# Patient Record
Sex: Female | Born: 1935
Health system: Southern US, Community
[De-identification: ages and names within clinical notes are randomized; demographics above are authoritative.]

## PROBLEM LIST (undated history)

## (undated) DIAGNOSIS — U071 COVID-19: Secondary | ICD-10-CM

## (undated) DIAGNOSIS — B9681 Helicobacter pylori [H. pylori] as the cause of diseases classified elsewhere: Secondary | ICD-10-CM

## (undated) DIAGNOSIS — C541 Malignant neoplasm of endometrium: Secondary | ICD-10-CM

## (undated) DIAGNOSIS — R7303 Prediabetes: Secondary | ICD-10-CM

## (undated) DIAGNOSIS — T4145XA Adverse effect of unspecified anesthetic, initial encounter: Secondary | ICD-10-CM

## (undated) DIAGNOSIS — I10 Essential (primary) hypertension: Secondary | ICD-10-CM

## (undated) DIAGNOSIS — K219 Gastro-esophageal reflux disease without esophagitis: Secondary | ICD-10-CM

## (undated) DIAGNOSIS — M545 Low back pain, unspecified: Secondary | ICD-10-CM

## (undated) DIAGNOSIS — R011 Cardiac murmur, unspecified: Secondary | ICD-10-CM

## (undated) DIAGNOSIS — M8580 Other specified disorders of bone density and structure, unspecified site: Secondary | ICD-10-CM

## (undated) DIAGNOSIS — E871 Hypo-osmolality and hyponatremia: Secondary | ICD-10-CM

## (undated) DIAGNOSIS — G2581 Restless legs syndrome: Secondary | ICD-10-CM

## (undated) DIAGNOSIS — K259 Gastric ulcer, unspecified as acute or chronic, without hemorrhage or perforation: Secondary | ICD-10-CM

## (undated) DIAGNOSIS — M1990 Unspecified osteoarthritis, unspecified site: Secondary | ICD-10-CM

## (undated) DIAGNOSIS — T8859XA Other complications of anesthesia, initial encounter: Secondary | ICD-10-CM

## (undated) DIAGNOSIS — G8929 Other chronic pain: Secondary | ICD-10-CM

## (undated) HISTORY — DX: Prediabetes: R73.03

## (undated) HISTORY — DX: Restless legs syndrome: G25.81

## (undated) HISTORY — DX: Low back pain, unspecified: M54.50

## (undated) HISTORY — DX: Other chronic pain: G89.29

## (undated) HISTORY — DX: COVID-19: U07.1

## (undated) HISTORY — DX: Hypo-osmolality and hyponatremia: E87.1

## (undated) HISTORY — DX: Other specified disorders of bone density and structure, unspecified site: M85.80

## (undated) HISTORY — PX: KNEE ARTHROSCOPY W/ MENISCAL REPAIR: SHX1877

---

## 1987-01-31 HISTORY — PX: BACK SURGERY: SHX140

## 1990-01-30 HISTORY — PX: DILATION AND CURETTAGE OF UTERUS: SHX78

## 1990-01-30 HISTORY — PX: ABDOMINAL HYSTERECTOMY: SHX81

## 2004-05-19 ENCOUNTER — Ambulatory Visit: Payer: Self-pay | Admitting: Internal Medicine

## 2005-05-22 ENCOUNTER — Ambulatory Visit: Payer: Self-pay | Admitting: Internal Medicine

## 2005-09-08 ENCOUNTER — Ambulatory Visit: Payer: Self-pay | Admitting: Internal Medicine

## 2006-05-28 ENCOUNTER — Ambulatory Visit: Payer: Self-pay | Admitting: Internal Medicine

## 2006-09-07 ENCOUNTER — Ambulatory Visit: Payer: Self-pay | Admitting: Unknown Physician Specialty

## 2006-12-21 ENCOUNTER — Ambulatory Visit: Payer: Self-pay | Admitting: Unknown Physician Specialty

## 2007-07-30 ENCOUNTER — Ambulatory Visit: Payer: Self-pay | Admitting: Internal Medicine

## 2008-01-03 ENCOUNTER — Ambulatory Visit: Payer: Self-pay | Admitting: Internal Medicine

## 2008-09-28 ENCOUNTER — Ambulatory Visit: Payer: Self-pay | Admitting: Internal Medicine

## 2009-06-08 ENCOUNTER — Encounter: Payer: Self-pay | Admitting: Gastroenterology

## 2009-06-22 ENCOUNTER — Encounter: Payer: Self-pay | Admitting: Gastroenterology

## 2009-06-29 ENCOUNTER — Encounter: Payer: Self-pay | Admitting: Gastroenterology

## 2009-06-29 ENCOUNTER — Ambulatory Visit: Payer: Self-pay | Admitting: Unknown Physician Specialty

## 2009-07-06 ENCOUNTER — Encounter (INDEPENDENT_AMBULATORY_CARE_PROVIDER_SITE_OTHER): Payer: Self-pay | Admitting: *Deleted

## 2009-07-06 ENCOUNTER — Telehealth (INDEPENDENT_AMBULATORY_CARE_PROVIDER_SITE_OTHER): Payer: Self-pay | Admitting: *Deleted

## 2009-07-06 DIAGNOSIS — R933 Abnormal findings on diagnostic imaging of other parts of digestive tract: Secondary | ICD-10-CM | POA: Insufficient documentation

## 2009-07-06 HISTORY — DX: Abnormal findings on diagnostic imaging of other parts of digestive tract: R93.3

## 2009-07-29 ENCOUNTER — Ambulatory Visit (HOSPITAL_COMMUNITY): Admission: RE | Admit: 2009-07-29 | Discharge: 2009-07-29 | Payer: Self-pay | Admitting: Gastroenterology

## 2009-07-29 ENCOUNTER — Ambulatory Visit: Payer: Self-pay | Admitting: Gastroenterology

## 2009-09-30 ENCOUNTER — Ambulatory Visit: Payer: Self-pay | Admitting: Internal Medicine

## 2010-03-01 NOTE — Letter (Signed)
Summary: EGD Instructions  [facility]  [address] [place], [place] [address]   Phone: [phone]  Fax: [phone]       Jill Stanley    Jun 20, 1932    MRN: [id number]       Procedure Day /Date:07/29/09 [name]       Arrival Time:1030 am     Procedure Time:1130 am     Location of Procedure:                     [facility] ( Outpatient Registration)    PREPARATION FOR ENDOSCOPY   On 07/29/09  THE DAY OF THE PROCEDURE:  1.   No solid foods, milk or milk products are allowed after midnight the night before your procedure.  2.   Do not drink anything colored red or purple.  Avoid juices with pulp.  No orange juice.  3.  You may drink clear liquids until 730 am , which is 4 hours before your procedure.                                                                                                CLEAR LIQUIDS INCLUDE: Water Jello Ice Popsicles Tea (sugar ok, no milk/cream) Powdered fruit flavored drinks Coffee (sugar ok, no milk/cream) Gatorade Juice: apple, white grape, white cranberry  Lemonade Clear bullion, consomm, broth Carbonated beverages (any kind) Strained chicken noodle soup Hard Candy   MEDICATION INSTRUCTIONS  Unless otherwise instructed, you should take regular prescription medications with a small sip of water as early as possible the morning of your procedure.             OTHER INSTRUCTIONS  You will need a responsible adult at least 75 years of age to accompany you and drive you home.   This person must remain in the waiting room during your procedure.  Wear loose fitting clothing that is easily removed.  Leave jewelry and other valuables at home.  However, you may wish to bring a book to read or an iPod/MP3 player to listen to music as you wait for your procedure to start.  Remove all body piercing jewelry and leave at home.  Total time from sign-in until discharge is approximately 2-3 hours.  You should go  home directly after your procedure and rest.  You can resume normal activities the day after your procedure.  The day of your procedure you should not:   Drive   Make legal decisions   Operate machinery   Drink alcohol   Return to work  You will receive specific instructions about eating, activities and medications before you leave.    The above instructions have been reviewed and explained to me by   Chales Abrahams CMA Duncan Dull)  July 06, 2009 3:09 PM     I fully understand and can verbalize these instructions over the phone mailed to home Date 07/06/09

## 2010-03-01 NOTE — Procedures (Signed)
Summary: [facility]  [facility]   Imported By: Lester Roslyn 07/12/2009 09:51:09  _____________________________________________________________________  External Attachment:    Type:   Image     Comment:   External Document

## 2010-03-01 NOTE — Progress Notes (Signed)
Summary: EUS  Phone Note Outgoing Call Call back at [facility] Phone [phone]   Call placed by: Chales Abrahams CMA Duncan Dull),  July 06, 2009 3:07 PM Summary of Call: pt scheduled for EUS, need to review meds and instruct pt Initial call taken by: Chales Abrahams CMA Duncan Dull),  July 06, 2009 3:07 PM  Follow-up for Phone Call        pt aware of instructions and meds reviewed.  she will call with any questions Follow-up by: Chales Abrahams CMA Duncan Dull),  July 06, 2009 3:47 PM  New Problems: NONSPECIFIC ABN FINDING RAD & OTH EXAM GI TRACT (ICD-793.4)   New Problems: NONSPECIFIC ABN FINDING RAD & OTH EXAM GI TRACT (ICD-793.4)

## 2010-03-01 NOTE — Procedures (Signed)
Summary: Endoscopic Ultrasound  Patient: Jill Stanley Note: All result statuses are Final unless otherwise noted.  Tests: (1) Endoscopic Ultrasound (EUS)  EUS Endoscopic Ultrasound                             DONE     [facility]     [address] [place], [place]  [address]           ENDOSCOPIC ULTRASOUND PROCEDURE REPORT     PATIENT:  Jill, Stanley  MR#:  [id number]     BIRTHDATE:  07/15/1932  GENDER:  female     ENDOSCOPIST:  Rachael Fee, MD     REFERRED BY:  Lynnae Prude, M.D.     PROCEDURE DATE:  07/29/2009     PROCEDURE:  Upper EUS     ASA CLASS:  Class II     INDICATIONS:  enlarged gastric folds noted by Dr. 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 The patient was then placed in the left, lateral,     decubitus postion and IV sedation was administered. Throughout the     procedure, the patient's blood pressure, pulse and oxygen     saturations were monitored continuously.  Under direct     visualization, the  endoscope was introduced through the mouth     and advanced to the duodenum.  Water was used as necessary to     provide an acoustic interface.  Upon completion of the imaging,     water was removed and the patient was sent to the recovery room in     satisfactory condition.     <<PROCEDUREIMAGES>>     Endoscopic findings:     1. Normal esophagus     2. 1cm, crescent shaped fold/prominant mucosa in distal stomach.     Following EUS examination the mucosa was biopsied.     3. Normal duodenum           EUS findings:     1. The lesion above corresponded with mildly thickend     mucosa/submucosal layers of gastric wall. There was no     sub-epithelial component, no gastric wall  masses.     2. No perigastric adenopathy.     3. Limited views of the pancreas, liver, portal vessels were all     normal.           Impression:     Non-specific, mild thickening of mucosa, submucosal layer of     distal gastric wall. This is not a gastric wall mass or     subepithelial lesion and will not require surveillance     examinations.  Repeat mucosal biopsy was performed.  She had a     gastric ulcer in the past, perhaps this is the site of previous     ulcer.           ______________________________     Rachael Fee, MD           n.     eSIGNED:   Rachael Fee  at 07/29/2009 12:38 PM           Jill, Stanley, [id number]  Note: An exclamation mark (!) indicates a result that was not dispersed into the flowsheet. Document Creation Date: 07/29/2009 12:38 PM _______________________________________________________________________  (1) Order result status: Final Collection or observation date-time: 07/29/2009 12:28 Requested date-time:  Receipt date-time:  Reported date-time:  Referring Physician:   Ordering Physician: Rob Bunting [phone]) Specimen Source:  Source: Launa Grill Order Number: [id number] Lab site:

## 2010-03-01 NOTE — Letter (Signed)
Summary: Scot Jun MD  Scot Jun MD   Imported By: Lester Kermit 07/12/2009 09:54:21  _____________________________________________________________________  External Attachment:    Type:   Image     Comment:   External Document

## 2010-10-26 ENCOUNTER — Ambulatory Visit: Payer: Self-pay | Admitting: Family Medicine

## 2011-10-27 ENCOUNTER — Ambulatory Visit: Payer: Self-pay | Admitting: Family Medicine

## 2011-11-30 ENCOUNTER — Ambulatory Visit: Payer: Self-pay | Admitting: Unknown Physician Specialty

## 2011-11-30 LAB — HM COLONOSCOPY

## 2012-10-23 ENCOUNTER — Ambulatory Visit: Payer: Self-pay | Admitting: General Practice

## 2012-10-31 ENCOUNTER — Ambulatory Visit: Payer: Self-pay | Admitting: Family Medicine

## 2012-11-07 ENCOUNTER — Ambulatory Visit: Payer: Self-pay | Admitting: General Practice

## 2012-11-07 LAB — POTASSIUM: Potassium: 4 mmol/L (ref 3.5–5.1)

## 2012-11-07 LAB — SODIUM: Sodium: 128 mmol/L — ABNORMAL LOW (ref 136–145)

## 2012-11-15 ENCOUNTER — Ambulatory Visit: Payer: Self-pay | Admitting: General Practice

## 2013-11-04 ENCOUNTER — Ambulatory Visit: Payer: Self-pay | Admitting: Family Medicine

## 2013-11-20 ENCOUNTER — Ambulatory Visit: Payer: Self-pay | Admitting: Orthopedic Surgery

## 2014-05-22 NOTE — Op Note (Signed)
PATIENT NAME:  Jill Stanley, PAREKH MR#:  [id number] DATE OF BIRTH:  06-15-1932  DATE OF PROCEDURE:  11/15/2012  PREOPERATIVE DIAGNOSIS:  Internal derangement of the left knee.   POSTOPERATIVE DIAGNOSES:  1.  Tear of the posterior horn medial meniscus, left knee.  2.  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   PROCEDURE PERFORMED:  Left knee arthroscopy, partial medial meniscectomy, and medial chondroplasty.   SURGEON:  Laurice Record. Hooten, MD  ANESTHESIA:  General.   ESTIMATED BLOOD LOSS:  Minimal.   TOURNIQUET TIME:  Not used.   DRAINS:  None.   INDICATIONS FOR SURGERY:  The patient is a 81 year old female who has been seen for complaints of persistent left knee pain. MRI demonstrated findings consistent with meniscal pathology as well as thinning of the articular cartilage. After a discussion of the risks and benefits of surgical intervention, the patient expressed understanding of the risks and benefits and agreed with plans for surgical intervention.   PROCEDURE IN DETAIL:  The patient was brought into the Operating Room, and after adequate general anesthesia was achieved, a tourniquet was placed on the patient's left thigh and the leg was placed in a leg holder. All bony prominences were well padded. The patient's left knee and leg were cleaned and prepped with alcohol and DuraPrep and draped in the usual sterile fashion. A "time-out" was performed as per usual protocol. The anticipated portal sites were injected with 0.25% Marcaine. An anterolateral portal was created and a cannula was inserted. A small effusion was evacuated. The scope was inserted and the knee was distended with fluid using the Stryker pump. The scope was advanced down the medial gutter into the medial compartment of the knee. Under visualization with the scope, an anteromedial portal was created and a hook probe was inserted. Inspection of the medial compartment demonstrated a radial-type  tear involving the posterior horn of the medial meniscus. The tear was debrided using meniscal punches and a 4.5-mm shaver. Contouring of the site was then performed using the 50-degree ArthroCare wand. The remaining portion of the medial meniscus was visualized and probed and felt to be stable. There were changes of grade III to early grade IV chondromalacia involving primarily the medial femoral condyle. These areas were debrided using the ArthroCare wand with a smooth transition zone created to the more normal-appearing articular cartilage. Lesser changes were noted to the medial tibial plateau and these areas were also contoured. The scope was then advanced into the intracondylar region. The anterior cruciate ligament was visualized and probed and felt to be stable. The scope was removed from the anterolateral portal and reinserted via the anteromedial portal so as to better visualize the lateral compartment. The lateral meniscus was visualized and probed and felt to be stable. The articular surface of the lateral compartment was in excellent condition. Finally, the scope was positioned so as to visualize the patellofemoral articulation. The articular surface was in good condition. Good patellar tracking was noted.   The knee was irrigated with copious amounts of fluid and then suctioned dry. The anterolateral portal was reapproximated using #3-0 nylon. Then 0.25% Marcaine was injected into the knee via the scope. Morphine was not utilized due to the patient's report of a MORPHINE ALLERGY. The scope was removed and the anteromedial portal was reapproximated using #3-0 nylon. A sterile dressing was applied followed by application of an ice wrap.   The patient tolerated the procedure well. She was transported to the Recovery  Room in stable condition.   ____________________________ Laurice Record. Holley Bouche., MD [email or web address] D: 11/16/2012 11:43:16 ET T: 11/16/2012 13:19:26 ET JOB#: [id number]  cc: Jeneen Rinks P. Holley Bouche.,  MD, <Dictator> JAMES P Holley Bouche MD ELECTRONICALLY SIGNED 11/19/2012 22:33

## 2014-12-17 ENCOUNTER — Other Ambulatory Visit: Payer: Self-pay | Admitting: Family Medicine

## 2014-12-17 DIAGNOSIS — Z1231 Encounter for screening mammogram for malignant neoplasm of breast: Secondary | ICD-10-CM

## 2014-12-29 ENCOUNTER — Other Ambulatory Visit: Payer: Self-pay | Admitting: Family Medicine

## 2014-12-29 ENCOUNTER — Ambulatory Visit
Admission: RE | Admit: 2014-12-29 | Discharge: 2014-12-29 | Disposition: A | Discharge status: Home / Self Care | Payer: Medicare PPO | Source: Ambulatory Visit | Attending: Family Medicine | Admitting: Family Medicine

## 2014-12-29 DIAGNOSIS — Z1231 Encounter for screening mammogram for malignant neoplasm of breast: Secondary | ICD-10-CM

## 2014-12-29 HISTORY — DX: Malignant neoplasm of endometrium: C54.1

## 2015-11-22 ENCOUNTER — Other Ambulatory Visit: Payer: Self-pay | Admitting: Family Medicine

## 2015-11-22 DIAGNOSIS — Z1231 Encounter for screening mammogram for malignant neoplasm of breast: Secondary | ICD-10-CM

## 2015-12-30 ENCOUNTER — Ambulatory Visit: Payer: Medicare PPO

## 2016-01-31 DIAGNOSIS — R011 Cardiac murmur, unspecified: Secondary | ICD-10-CM

## 2016-01-31 HISTORY — DX: Cardiac murmur, unspecified: R01.1

## 2016-07-26 ENCOUNTER — Encounter
Admission: RE | Admit: 2016-07-26 | Discharge: 2016-07-26 | Disposition: A | Discharge status: Home / Self Care | Payer: Medicare PPO | Source: Ambulatory Visit | Attending: Orthopedic Surgery | Admitting: Orthopedic Surgery

## 2016-07-26 DIAGNOSIS — Z01812 Encounter for preprocedural laboratory examination: Secondary | ICD-10-CM | POA: Diagnosis present

## 2016-07-26 DIAGNOSIS — Z01810 Encounter for preprocedural cardiovascular examination: Secondary | ICD-10-CM | POA: Insufficient documentation

## 2016-07-26 DIAGNOSIS — Z8542 Personal history of malignant neoplasm of other parts of uterus: Secondary | ICD-10-CM | POA: Diagnosis not present

## 2016-07-26 DIAGNOSIS — I10 Essential (primary) hypertension: Secondary | ICD-10-CM | POA: Insufficient documentation

## 2016-07-26 DIAGNOSIS — R933 Abnormal findings on diagnostic imaging of other parts of digestive tract: Secondary | ICD-10-CM | POA: Diagnosis not present

## 2016-07-26 DIAGNOSIS — R011 Cardiac murmur, unspecified: Secondary | ICD-10-CM | POA: Insufficient documentation

## 2016-07-26 DIAGNOSIS — K219 Gastro-esophageal reflux disease without esophagitis: Secondary | ICD-10-CM | POA: Insufficient documentation

## 2016-07-26 HISTORY — DX: Unspecified osteoarthritis, unspecified site: M19.90

## 2016-07-26 HISTORY — DX: Helicobacter pylori (H. pylori) as the cause of diseases classified elsewhere: K25.9

## 2016-07-26 HISTORY — DX: Essential (primary) hypertension: I10

## 2016-07-26 HISTORY — DX: Cardiac murmur, unspecified: R01.1

## 2016-07-26 HISTORY — DX: Other complications of anesthesia, initial encounter: T88.59XA

## 2016-07-26 HISTORY — DX: Helicobacter pylori (H. pylori) as the cause of diseases classified elsewhere: B96.81

## 2016-07-26 HISTORY — DX: Gastro-esophageal reflux disease without esophagitis: K21.9

## 2016-07-26 HISTORY — DX: Adverse effect of unspecified anesthetic, initial encounter: T41.45XA

## 2016-07-26 LAB — COMPREHENSIVE METABOLIC PANEL WITH GFR
ALT: 16 U/L (ref 14–54)
AST: 21 U/L (ref 15–41)
Albumin: 4.2 g/dL (ref 3.5–5.0)
Alkaline Phosphatase: 58 U/L (ref 38–126)
Anion gap: 8 (ref 5–15)
BUN: 15 mg/dL (ref 6–20)
CO2: 28 mmol/L (ref 22–32)
Calcium: 9.5 mg/dL (ref 8.9–10.3)
Chloride: 88 mmol/L — ABNORMAL LOW (ref 101–111)
Creatinine, Ser: 0.6 mg/dL (ref 0.44–1.00)
GFR calc Af Amer: >60 mL/min (ref >60)
GFR calc non Af Amer: >60 mL/min (ref >60)
Glucose, Bld: 91 mg/dL (ref 65–99)
Potassium: 3.4 mmol/L — ABNORMAL LOW (ref 3.5–5.1)
Sodium: 124 mmol/L — ABNORMAL LOW (ref 135–145)
Total Bilirubin: 0.6 mg/dL (ref 0.3–1.2)
Total Protein: 7.3 g/dL (ref 6.5–8.1)

## 2016-07-26 LAB — CBC
HEMATOCRIT: 38.4 % (ref 35.0–47.0)
HEMOGLOBIN: 13.3 g/dL (ref 12.0–16.0)
MCH: 30.2 pg (ref 26.0–34.0)
MCHC: 34.6 g/dL (ref 32.0–36.0)
MCV: 87.2 fL (ref 80.0–100.0)
Platelets: 278 10*3/uL (ref 150–440)
RBC: 4.4 MIL/uL (ref 3.80–5.20)
RDW: 13 % (ref 11.5–14.5)
WBC: 8 10*3/uL (ref 3.6–11.0)

## 2016-07-26 LAB — URINALYSIS, ROUTINE W REFLEX MICROSCOPIC
Bilirubin Urine: NEGATIVE
Glucose, UA: NEGATIVE mg/dL
HGB URINE DIPSTICK: NEGATIVE
Ketones, ur: NEGATIVE mg/dL
LEUKOCYTES UA: NEGATIVE
Nitrite: NEGATIVE
PROTEIN: NEGATIVE mg/dL
SPECIFIC GRAVITY, URINE: 1.005 (ref 1.005–1.030)
pH: 8 (ref 5.0–8.0)

## 2016-07-26 LAB — SURGICAL PCR SCREEN
MRSA, PCR: NEGATIVE
STAPHYLOCOCCUS AUREUS: NEGATIVE

## 2016-07-26 LAB — TYPE AND SCREEN
ABO/RH(D): A NEG
Antibody Screen: NEGATIVE

## 2016-07-26 LAB — C-REACTIVE PROTEIN

## 2016-07-26 LAB — PROTIME-INR
INR: 0.92
Prothrombin Time: 12.3 seconds (ref 11.4–15.2)

## 2016-07-26 LAB — APTT: aPTT: 32 s (ref 24–36)

## 2016-07-26 LAB — SEDIMENTATION RATE: Sed Rate: 13 mm/h (ref 0–30)

## 2016-07-26 NOTE — Patient Instructions (Signed)
  Your procedure is scheduled on:Wednesday August 09, 2016. Report to Same Day Surgery. To find out your arrival time please call [phone] between 1PM - 3PM on Tuesday August 08, 2016.  Remember: Instructions that are not followed completely may result in serious medical risk, up to and including death, or upon the discretion of your surgeon and anesthesiologist your surgery may need to be rescheduled.    _x___ 1. Do not eat food or drink liquids after midnight. No gum chewing or hard candies.     _x___ 2. No Alcohol for 24 hours before or after surgery.   ____ 3. Bring all medications with you on the day of surgery if instructed.    __x__ 4. Notify your doctor if there is any change in your medical condition     (cold, fever, infections).    _____ 5. No smoking 24 hours prior to surgery.     Do not wear jewelry, make-up, hairpins, clips or nail polish.  Do not wear lotions, powders, or perfumes.   Do not shave 48 hours prior to surgery. Men may shave face and neck.  Do not bring valuables to the hospital.    [facility] is not responsible for any belongings or valuables.               Contacts, dentures or bridgework may not be worn into surgery.  Leave your suitcase in the car. After surgery it may be brought to your room.  For patients admitted to the hospital, discharge time is determined by your treatment team.   Patients discharged the day of surgery will not be allowed to drive home.    Please read over the following fact sheets that you were given:   [facility] Preparing for Surgery  __x__ Take these medicines the morning of surgery with A SIP OF WATER:    1. Nexium the night prior to surgery and the morning of surgery   ____ Fleet Enema (as directed)   _x___ Use CHG Soap as directed on instruction sheet  ____ Use inhalers on the day of surgery and bring to hospital day of surgery  ____ Stop metformin 2 days prior to surgery    ____ Take 1/2 of usual insulin  dose the night before surgery and none on the morning of surgery.   ____ Stop Coumadin/Plavix/aspirin on does not apply.  ____ Stop Anti-inflammatories such as Advil, Aleve, Ibuprofen, Motrin, Naproxen,  Naprosyn, Goodies powders or aspirin  products. OK to take Tylenol.   ____ Stop supplements until after surgery.    ____ Bring C-Pap to the hospital.

## 2016-07-27 LAB — URINE CULTURE
Culture: NO GROWTH
SPECIAL REQUESTS: NORMAL

## 2016-07-27 NOTE — Pre-Procedure Instructions (Signed)
LOW SODIUM CALLED AND FAXED TO DR HOOTEN. LM FOR ELAINE. WILL RECHECK AM SURGERY

## 2016-08-08 MED ORDER — CEFAZOLIN SODIUM-DEXTROSE 2-4 GM/100ML-% IV SOLN
2.0000 g | INTRAVENOUS | Status: AC
  Administered 2016-08-09: 2 g via INTRAVENOUS

## 2016-08-08 MED ORDER — TRANEXAMIC ACID 1000 MG/10ML IV SOLN
1000.0000 mg | INTRAVENOUS | Status: AC
  Administered 2016-08-09: 1000 mg via INTRAVENOUS
  Filled 2016-08-08: qty 10

## 2016-08-09 ENCOUNTER — Encounter
Admission: RE | Disposition: A | Discharge status: Home Health | Payer: Self-pay | Source: Ambulatory Visit | Attending: Orthopedic Surgery

## 2016-08-09 ENCOUNTER — Encounter: Payer: Self-pay | Admitting: *Deleted

## 2016-08-09 ENCOUNTER — Inpatient Hospital Stay: Payer: Medicare PPO | Admitting: Certified Registered Nurse Anesthetist

## 2016-08-09 ENCOUNTER — Inpatient Hospital Stay
Admission: RE | Admit: 2016-08-09 | Discharge: 2016-08-11 | DRG: 470 | Disposition: A | Discharge status: Home Health | Payer: Medicare PPO | Source: Ambulatory Visit | Attending: Orthopedic Surgery | Admitting: Orthopedic Surgery

## 2016-08-09 ENCOUNTER — Inpatient Hospital Stay: Payer: Medicare PPO

## 2016-08-09 DIAGNOSIS — E871 Hypo-osmolality and hyponatremia: Secondary | ICD-10-CM | POA: Diagnosis present

## 2016-08-09 DIAGNOSIS — R011 Cardiac murmur, unspecified: Secondary | ICD-10-CM | POA: Diagnosis present

## 2016-08-09 DIAGNOSIS — K219 Gastro-esophageal reflux disease without esophagitis: Secondary | ICD-10-CM | POA: Diagnosis present

## 2016-08-09 DIAGNOSIS — I10 Essential (primary) hypertension: Secondary | ICD-10-CM | POA: Diagnosis present

## 2016-08-09 DIAGNOSIS — Z6832 Body mass index (BMI) 32.0-32.9, adult: Secondary | ICD-10-CM | POA: Diagnosis not present

## 2016-08-09 DIAGNOSIS — Z8711 Personal history of peptic ulcer disease: Secondary | ICD-10-CM

## 2016-08-09 DIAGNOSIS — Z96659 Presence of unspecified artificial knee joint: Secondary | ICD-10-CM

## 2016-08-09 DIAGNOSIS — Z885 Allergy status to narcotic agent status: Secondary | ICD-10-CM | POA: Diagnosis not present

## 2016-08-09 DIAGNOSIS — E669 Obesity, unspecified: Secondary | ICD-10-CM | POA: Diagnosis present

## 2016-08-09 DIAGNOSIS — Z888 Allergy status to other drugs, medicaments and biological substances status: Secondary | ICD-10-CM | POA: Diagnosis not present

## 2016-08-09 DIAGNOSIS — Z886 Allergy status to analgesic agent status: Secondary | ICD-10-CM

## 2016-08-09 DIAGNOSIS — E876 Hypokalemia: Secondary | ICD-10-CM | POA: Diagnosis present

## 2016-08-09 DIAGNOSIS — M25562 Pain in left knee: Secondary | ICD-10-CM | POA: Diagnosis present

## 2016-08-09 DIAGNOSIS — E7800 Pure hypercholesterolemia, unspecified: Secondary | ICD-10-CM | POA: Diagnosis present

## 2016-08-09 DIAGNOSIS — Z8542 Personal history of malignant neoplasm of other parts of uterus: Secondary | ICD-10-CM | POA: Diagnosis not present

## 2016-08-09 DIAGNOSIS — Z800 Family history of malignant neoplasm of digestive organs: Secondary | ICD-10-CM | POA: Diagnosis not present

## 2016-08-09 DIAGNOSIS — M1712 Unilateral primary osteoarthritis, left knee: Principal | ICD-10-CM | POA: Diagnosis present

## 2016-08-09 HISTORY — DX: Presence of unspecified artificial knee joint: Z96.659

## 2016-08-09 HISTORY — PX: KNEE ARTHROPLASTY: SHX992

## 2016-08-09 LAB — ABO/RH: ABO/RH(D): A NEG

## 2016-08-09 LAB — SODIUM: Sodium: 130 mmol/L — ABNORMAL LOW (ref 135–145)

## 2016-08-09 SURGERY — ARTHROPLASTY, KNEE, TOTAL, USING IMAGELESS COMPUTER-ASSISTED NAVIGATION
Anesthesia: Spinal | Site: Knee | Laterality: Left | Wound class: Clean

## 2016-08-09 MED ORDER — PROPOFOL 500 MG/50ML IV EMUL
INTRAVENOUS | Status: DC | PRN
  Administered 2016-08-09: 40 ug/kg/min via INTRAVENOUS

## 2016-08-09 MED ORDER — FENTANYL CITRATE (PF) 100 MCG/2ML IJ SOLN
INTRAMUSCULAR | Status: AC
  Filled 2016-08-09: qty 2

## 2016-08-09 MED ORDER — SODIUM CHLORIDE 0.9 % IV SOLN: INTRAVENOUS | Status: DC | PRN

## 2016-08-09 MED ORDER — BUPIVACAINE HCL (PF) 0.5 % IJ SOLN
INTRAMUSCULAR | Status: AC
  Filled 2016-08-09: qty 10

## 2016-08-09 MED ORDER — ACETAMINOPHEN 325 MG PO TABS: 650.0000 mg | ORAL_TABLET | Freq: Four times a day (QID) | ORAL | Status: DC | PRN

## 2016-08-09 MED ORDER — PHENYLEPHRINE HCL 10 MG/ML IJ SOLN
INTRAMUSCULAR | Status: AC
  Filled 2016-08-09: qty 1

## 2016-08-09 MED ORDER — LACTATED RINGERS IV SOLN
INTRAVENOUS | Status: DC
  Administered 2016-08-09 (×2): via INTRAVENOUS

## 2016-08-09 MED ORDER — PHENOL 1.4 % MT LIQD
1.0000 | OROMUCOSAL | Status: DC | PRN
  Filled 2016-08-09: qty 177

## 2016-08-09 MED ORDER — FENTANYL CITRATE (PF) 100 MCG/2ML IJ SOLN
INTRAMUSCULAR | Status: DC | PRN
  Administered 2016-08-09: 50 ug via INTRAVENOUS
  Administered 2016-08-09 (×2): 25 ug via INTRAVENOUS

## 2016-08-09 MED ORDER — MAGNESIUM HYDROXIDE 400 MG/5ML PO SUSP
30.0000 mL | Freq: Every day | ORAL | Status: DC | PRN
  Administered 2016-08-10 (×2): 30 mL via ORAL
  Filled 2016-08-09 (×2): qty 30

## 2016-08-09 MED ORDER — CEFAZOLIN SODIUM-DEXTROSE 2-4 GM/100ML-% IV SOLN
2.0000 g | Freq: Four times a day (QID) | INTRAVENOUS | Status: AC
  Administered 2016-08-09 – 2016-08-10 (×4): 2 g via INTRAVENOUS
  Filled 2016-08-09 (×4): qty 100

## 2016-08-09 MED ORDER — TETRACAINE HCL 1 % IJ SOLN
INTRAMUSCULAR | Status: AC
  Filled 2016-08-09: qty 2

## 2016-08-09 MED ORDER — ACETAMINOPHEN 10 MG/ML IV SOLN
INTRAVENOUS | Status: AC
  Filled 2016-08-09: qty 100

## 2016-08-09 MED ORDER — SENNOSIDES-DOCUSATE SODIUM 8.6-50 MG PO TABS
1.0000 | ORAL_TABLET | Freq: Two times a day (BID) | ORAL | Status: DC
  Administered 2016-08-09 – 2016-08-11 (×5): 1 via ORAL
  Filled 2016-08-09 (×5): qty 1

## 2016-08-09 MED ORDER — MIDAZOLAM HCL 5 MG/5ML IJ SOLN
INTRAMUSCULAR | Status: DC | PRN
  Administered 2016-08-09 (×2): 1 mg via INTRAVENOUS

## 2016-08-09 MED ORDER — BUPIVACAINE HCL (PF) 0.5 % IJ SOLN
INTRAMUSCULAR | Status: DC | PRN
  Administered 2016-08-09: 2 mL

## 2016-08-09 MED ORDER — PROMETHAZINE HCL 25 MG/ML IJ SOLN: 6.2500 mg | INTRAMUSCULAR | Status: DC | PRN

## 2016-08-09 MED ORDER — METHYLCELLULOSE (LAXATIVE) PO POWD: 1.0000 | Freq: Two times a day (BID) | ORAL | Status: DC

## 2016-08-09 MED ORDER — CALCIUM CARBONATE ANTACID 1000 MG PO CHEW: 2.0000 | CHEWABLE_TABLET | ORAL | Status: DC | PRN

## 2016-08-09 MED ORDER — NEOMYCIN-POLYMYXIN B GU 40-200000 IR SOLN
Status: DC | PRN
  Administered 2016-08-09: 16 mL

## 2016-08-09 MED ORDER — HYDROCHLOROTHIAZIDE 25 MG PO TABS: 25.0000 mg | ORAL_TABLET | Freq: Two times a day (BID) | ORAL | Status: DC

## 2016-08-09 MED ORDER — MIDAZOLAM HCL 2 MG/2ML IJ SOLN
INTRAMUSCULAR | Status: AC
Start: 2016-08-09 — End: 2016-08-09
  Filled 2016-08-09: qty 2

## 2016-08-09 MED ORDER — FLEET ENEMA 7-19 GM/118ML RE ENEM: 1.0000 | ENEMA | Freq: Once | RECTAL | Status: DC | PRN

## 2016-08-09 MED ORDER — PSYLLIUM 95 % PO PACK
1.0000 | PACK | Freq: Every day | ORAL | Status: DC
  Filled 2016-08-09 (×3): qty 1

## 2016-08-09 MED ORDER — CALCIUM CARBONATE-VITAMIN D 500-200 MG-UNIT PO TABS
2.0000 | ORAL_TABLET | Freq: Two times a day (BID) | ORAL | Status: DC
  Administered 2016-08-09 – 2016-08-11 (×4): 2 via ORAL
  Filled 2016-08-09 (×4): qty 2

## 2016-08-09 MED ORDER — PROPOFOL 10 MG/ML IV BOLUS
INTRAVENOUS | Status: DC | PRN
  Administered 2016-08-09: 10 mg via INTRAVENOUS
  Administered 2016-08-09: 30 mg via INTRAVENOUS

## 2016-08-09 MED ORDER — BUPIVACAINE HCL (PF) 0.25 % IJ SOLN
INTRAMUSCULAR | Status: AC
  Filled 2016-08-09: qty 60

## 2016-08-09 MED ORDER — LISINOPRIL-HYDROCHLOROTHIAZIDE 20-25 MG PO TABS: 1.0000 | ORAL_TABLET | Freq: Two times a day (BID) | ORAL | Status: DC

## 2016-08-09 MED ORDER — METOCLOPRAMIDE HCL 10 MG PO TABS
10.0000 mg | ORAL_TABLET | Freq: Three times a day (TID) | ORAL | Status: AC
  Administered 2016-08-09 – 2016-08-11 (×8): 10 mg via ORAL
  Filled 2016-08-09 (×8): qty 1

## 2016-08-09 MED ORDER — SODIUM CHLORIDE 0.9 % IV SOLN
INTRAVENOUS | Status: DC | PRN
  Administered 2016-08-09: 15 ug/min via INTRAVENOUS

## 2016-08-09 MED ORDER — MEPERIDINE HCL 50 MG/ML IJ SOLN: 6.2500 mg | INTRAMUSCULAR | Status: DC | PRN

## 2016-08-09 MED ORDER — SODIUM CHLORIDE 0.9 % IJ SOLN
INTRAMUSCULAR | Status: AC
  Filled 2016-08-09: qty 10

## 2016-08-09 MED ORDER — BUPIVACAINE LIPOSOME 1.3 % IJ SUSP
INTRAMUSCULAR | Status: AC
  Filled 2016-08-09: qty 20

## 2016-08-09 MED ORDER — ALUM & MAG HYDROXIDE-SIMETH 200-200-20 MG/5ML PO SUSP
30.0000 mL | ORAL | Status: DC | PRN
  Administered 2016-08-09: 30 mL via ORAL
  Filled 2016-08-09: qty 30

## 2016-08-09 MED ORDER — CEFAZOLIN SODIUM-DEXTROSE 2-4 GM/100ML-% IV SOLN
INTRAVENOUS | Status: AC
  Filled 2016-08-09: qty 100

## 2016-08-09 MED ORDER — ONDANSETRON HCL 4 MG/2ML IJ SOLN: 4.0000 mg | Freq: Four times a day (QID) | INTRAMUSCULAR | Status: DC | PRN

## 2016-08-09 MED ORDER — BISACODYL 10 MG RE SUPP
10.0000 mg | Freq: Every day | RECTAL | Status: DC | PRN
  Administered 2016-08-11: 10 mg via RECTAL
  Filled 2016-08-09: qty 1

## 2016-08-09 MED ORDER — BUPIVACAINE LIPOSOME 1.3 % IJ SUSP
INTRAMUSCULAR | Status: DC | PRN
  Administered 2016-08-09: 60 mL

## 2016-08-09 MED ORDER — SUCRALFATE 1 G PO TABS
1.0000 g | ORAL_TABLET | Freq: Two times a day (BID) | ORAL | Status: DC
  Administered 2016-08-09 – 2016-08-11 (×5): 1 g via ORAL
  Filled 2016-08-09 (×5): qty 1

## 2016-08-09 MED ORDER — SODIUM CHLORIDE 0.9 % IJ SOLN
INTRAMUSCULAR | Status: AC
  Filled 2016-08-09: qty 50

## 2016-08-09 MED ORDER — BUPIVACAINE HCL (PF) 0.25 % IJ SOLN
INTRAMUSCULAR | Status: DC | PRN
  Administered 2016-08-09: 60 mL

## 2016-08-09 MED ORDER — HYDROMORPHONE HCL 1 MG/ML IJ SOLN: 0.5000 mg | INTRAMUSCULAR | Status: DC | PRN

## 2016-08-09 MED ORDER — CALCIUM CARBONATE-VITAMIN D3 600-400 MG-UNIT PO TABS: 2.0000 | ORAL_TABLET | Freq: Every evening | ORAL | Status: DC

## 2016-08-09 MED ORDER — CALCIUM CARBONATE 1250 (500 CA) MG PO TABS
1.0000 | ORAL_TABLET | ORAL | Status: DC | PRN
  Filled 2016-08-09: qty 1

## 2016-08-09 MED ORDER — ACETAMINOPHEN 10 MG/ML IV SOLN
INTRAVENOUS | Status: DC | PRN
  Administered 2016-08-09: 1000 mg via INTRAVENOUS

## 2016-08-09 MED ORDER — FENTANYL CITRATE (PF) 100 MCG/2ML IJ SOLN: 25.0000 ug | INTRAMUSCULAR | Status: DC | PRN

## 2016-08-09 MED ORDER — PROPOFOL 500 MG/50ML IV EMUL
INTRAVENOUS | Status: AC
  Filled 2016-08-09: qty 100

## 2016-08-09 MED ORDER — FERROUS SULFATE 325 (65 FE) MG PO TABS
325.0000 mg | ORAL_TABLET | Freq: Two times a day (BID) | ORAL | Status: DC
  Administered 2016-08-09 – 2016-08-11 (×4): 325 mg via ORAL
  Filled 2016-08-09 (×4): qty 1

## 2016-08-09 MED ORDER — ACETAMINOPHEN 10 MG/ML IV SOLN
1000.0000 mg | Freq: Four times a day (QID) | INTRAVENOUS | Status: AC
  Administered 2016-08-09 – 2016-08-10 (×4): 1000 mg via INTRAVENOUS
  Filled 2016-08-09 (×4): qty 100

## 2016-08-09 MED ORDER — PHENYLEPHRINE HCL 10 MG/ML IJ SOLN
INTRAMUSCULAR | Status: DC | PRN
  Administered 2016-08-09 (×3): 100 ug via INTRAVENOUS

## 2016-08-09 MED ORDER — LISINOPRIL 20 MG PO TABS: 20.0000 mg | ORAL_TABLET | Freq: Two times a day (BID) | ORAL | Status: DC

## 2016-08-09 MED ORDER — NEOMYCIN-POLYMYXIN B GU 40-200000 IR SOLN
Status: AC
  Filled 2016-08-09: qty 20

## 2016-08-09 MED ORDER — DIPHENHYDRAMINE HCL 12.5 MG/5ML PO ELIX: 12.5000 mg | ORAL_SOLUTION | ORAL | Status: DC | PRN

## 2016-08-09 MED ORDER — OXYCODONE HCL 5 MG PO TABS: 5.0000 mg | ORAL_TABLET | Freq: Once | ORAL | Status: DC | PRN

## 2016-08-09 MED ORDER — HYDROCHLOROTHIAZIDE 25 MG PO TABS
25.0000 mg | ORAL_TABLET | Freq: Two times a day (BID) | ORAL | Status: DC
  Administered 2016-08-09 – 2016-08-11 (×5): 25 mg via ORAL
  Filled 2016-08-09 (×5): qty 1

## 2016-08-09 MED ORDER — ONDANSETRON HCL 4 MG/2ML IJ SOLN
INTRAMUSCULAR | Status: AC
  Filled 2016-08-09: qty 2

## 2016-08-09 MED ORDER — ACETAMINOPHEN 650 MG RE SUPP: 650.0000 mg | Freq: Four times a day (QID) | RECTAL | Status: DC | PRN

## 2016-08-09 MED ORDER — SODIUM CHLORIDE 0.9 % IV SOLN
INTRAVENOUS | Status: DC
  Administered 2016-08-09 – 2016-08-10 (×2): via INTRAVENOUS

## 2016-08-09 MED ORDER — CALCIUM CARBONATE ANTACID 500 MG PO CHEW: 500.0000 mg | CHEWABLE_TABLET | Freq: Every day | ORAL | Status: DC | PRN

## 2016-08-09 MED ORDER — TETRACAINE HCL 1 % IJ SOLN
INTRAMUSCULAR | Status: DC | PRN
  Administered 2016-08-09: 10 mg via INTRASPINAL

## 2016-08-09 MED ORDER — EPHEDRINE SULFATE 50 MG/ML IJ SOLN
INTRAMUSCULAR | Status: AC
  Filled 2016-08-09: qty 1

## 2016-08-09 MED ORDER — ENOXAPARIN SODIUM 30 MG/0.3ML ~~LOC~~ SOLN
30.0000 mg | Freq: Two times a day (BID) | SUBCUTANEOUS | Status: DC
  Administered 2016-08-10 – 2016-08-11 (×3): 30 mg via SUBCUTANEOUS
  Filled 2016-08-09 (×3): qty 0.3

## 2016-08-09 MED ORDER — ONDANSETRON HCL 4 MG PO TABS: 4.0000 mg | ORAL_TABLET | Freq: Four times a day (QID) | ORAL | Status: DC | PRN

## 2016-08-09 MED ORDER — MENTHOL 3 MG MT LOZG
1.0000 | LOZENGE | OROMUCOSAL | Status: DC | PRN
  Filled 2016-08-09: qty 9

## 2016-08-09 MED ORDER — ONDANSETRON HCL 4 MG/2ML IJ SOLN
INTRAMUSCULAR | Status: DC | PRN
  Administered 2016-08-09: 4 mg via INTRAVENOUS

## 2016-08-09 MED ORDER — CHLORHEXIDINE GLUCONATE 4 % EX LIQD: 60.0000 mL | Freq: Once | CUTANEOUS | Status: DC

## 2016-08-09 MED ORDER — LIDOCAINE HCL (PF) 2 % IJ SOLN
INTRAMUSCULAR | Status: AC
  Filled 2016-08-09: qty 2

## 2016-08-09 MED ORDER — PANTOPRAZOLE SODIUM 40 MG PO TBEC
40.0000 mg | DELAYED_RELEASE_TABLET | Freq: Two times a day (BID) | ORAL | Status: DC
  Administered 2016-08-09 – 2016-08-11 (×5): 40 mg via ORAL
  Filled 2016-08-09 (×5): qty 1

## 2016-08-09 MED ORDER — OXYCODONE HCL 5 MG PO TABS
5.0000 mg | ORAL_TABLET | ORAL | Status: DC | PRN
  Administered 2016-08-09: 10 mg via ORAL
  Administered 2016-08-09: 5 mg via ORAL
  Administered 2016-08-10 (×2): 10 mg via ORAL
  Administered 2016-08-11: 5 mg via ORAL
  Filled 2016-08-09: qty 1
  Filled 2016-08-09 (×2): qty 2
  Filled 2016-08-09: qty 1
  Filled 2016-08-09: qty 2
  Filled 2016-08-09: qty 1

## 2016-08-09 MED ORDER — TRANEXAMIC ACID 1000 MG/10ML IV SOLN
1000.0000 mg | Freq: Once | INTRAVENOUS | Status: AC
  Administered 2016-08-09: 1000 mg via INTRAVENOUS
  Filled 2016-08-09: qty 10

## 2016-08-09 MED ORDER — LISINOPRIL 20 MG PO TABS
20.0000 mg | ORAL_TABLET | Freq: Two times a day (BID) | ORAL | Status: DC
  Administered 2016-08-09 – 2016-08-11 (×5): 20 mg via ORAL
  Filled 2016-08-09 (×6): qty 1

## 2016-08-09 MED ORDER — LIDOCAINE HCL (CARDIAC) 20 MG/ML IV SOLN
INTRAVENOUS | Status: DC | PRN
  Administered 2016-08-09: 30 mg via INTRAVENOUS

## 2016-08-09 MED ORDER — OXYCODONE HCL 5 MG/5ML PO SOLN: 5.0000 mg | Freq: Once | ORAL | Status: DC | PRN

## 2016-08-09 SURGICAL SUPPLY — 61 items
BATTERY INSTRU NAVIGATION (MISCELLANEOUS) ×12 IMPLANT
BLADE SAW 1 (BLADE) ×3 IMPLANT
BLADE SAW 1/2 (BLADE) ×3 IMPLANT
BLADE SAW 70X12.5 (BLADE) IMPLANT
CANISTER SUCT 1200ML W/VALVE (MISCELLANEOUS) ×3 IMPLANT
CANISTER SUCT 3000ML PPV (MISCELLANEOUS) ×6 IMPLANT
CAPT KNEE TOTAL 3 ATTUNE ×3 IMPLANT
CATH TRAY METER 16FR LF (MISCELLANEOUS) ×3 IMPLANT
CEMENT HV SMART SET (Cement) ×6 IMPLANT
COOLER POLAR GLACIER W/PUMP (MISCELLANEOUS) ×3 IMPLANT
CUFF TOURN 24 STER (MISCELLANEOUS) IMPLANT
CUFF TOURN 30 STER DUAL PORT (MISCELLANEOUS) IMPLANT
DRAPE SHEET LG 3/4 BI-LAMINATE (DRAPES) ×3 IMPLANT
DRSG DERMACEA 8X12 NADH (GAUZE/BANDAGES/DRESSINGS) ×3 IMPLANT
DRSG OPSITE POSTOP 4X14 (GAUZE/BANDAGES/DRESSINGS) ×3 IMPLANT
DRSG TEGADERM 4X4.75 (GAUZE/BANDAGES/DRESSINGS) ×3 IMPLANT
DURAPREP 26ML APPLICATOR (WOUND CARE) ×6 IMPLANT
ELECT CAUTERY BLADE 6.4 (BLADE) ×3 IMPLANT
ELECT REM PT RETURN 9FT ADLT (ELECTROSURGICAL) ×3
ELECTRODE REM PT RTRN 9FT ADLT (ELECTROSURGICAL) ×1 IMPLANT
EX-PIN ORTHOLOCK NAV 4X150 (PIN) ×6 IMPLANT
GLOVE BIOGEL M STRL SZ7.5 (GLOVE) ×6 IMPLANT
GLOVE BIOGEL PI IND STRL 9 (GLOVE) ×1 IMPLANT
GLOVE BIOGEL PI INDICATOR 9 (GLOVE) ×2
GLOVE INDICATOR 8.0 STRL GRN (GLOVE) ×3 IMPLANT
GLOVE SURG SYN 9.0  PF PI (GLOVE) ×2
GLOVE SURG SYN 9.0 PF PI (GLOVE) ×1 IMPLANT
GOWN STRL REUS W/ TWL LRG LVL3 (GOWN DISPOSABLE) ×2 IMPLANT
GOWN STRL REUS W/TWL 2XL LVL3 (GOWN DISPOSABLE) ×3 IMPLANT
GOWN STRL REUS W/TWL LRG LVL3 (GOWN DISPOSABLE) ×4
HEMOVAC 400CC 10FR (MISCELLANEOUS) ×3 IMPLANT
HOLDER FOLEY CATH W/STRAP (MISCELLANEOUS) ×3 IMPLANT
HOOD PEEL AWAY FLYTE STAYCOOL (MISCELLANEOUS) ×6 IMPLANT
KIT RM TURNOVER STRD PROC AR (KITS) ×3 IMPLANT
KNIFE SCULPS 14X20 (INSTRUMENTS) ×3 IMPLANT
LABEL OR SOLS (LABEL) ×3 IMPLANT
NDL SAFETY 18GX1.5 (NEEDLE) ×3 IMPLANT
NEEDLE SPNL 20GX3.5 QUINCKE YW (NEEDLE) ×3 IMPLANT
NS IRRIG 500ML POUR BTL (IV SOLUTION) ×3 IMPLANT
PACK TOTAL KNEE (MISCELLANEOUS) ×3 IMPLANT
PAD WRAPON POLAR KNEE (MISCELLANEOUS) ×1 IMPLANT
PIN FIXATION 1/8DIA X 3INL (PIN) ×3 IMPLANT
PULSAVAC PLUS IRRIG FAN TIP (DISPOSABLE) ×3
SOL .9 NS 3000ML IRR  AL (IV SOLUTION) ×2
SOL .9 NS 3000ML IRR UROMATIC (IV SOLUTION) ×1 IMPLANT
SOL PREP PVP 2OZ (MISCELLANEOUS) ×3
SOLUTION PREP PVP 2OZ (MISCELLANEOUS) ×1 IMPLANT
SPONGE DRAIN TRACH 4X4 STRL 2S (GAUZE/BANDAGES/DRESSINGS) ×3 IMPLANT
STAPLER SKIN PROX 35W (STAPLE) ×3 IMPLANT
STRAP TIBIA SHORT (MISCELLANEOUS) ×3 IMPLANT
SUCTION FRAZIER HANDLE 10FR (MISCELLANEOUS) ×2
SUCTION TUBE FRAZIER 10FR DISP (MISCELLANEOUS) ×1 IMPLANT
SUT VIC AB 0 CT1 36 (SUTURE) ×3 IMPLANT
SUT VIC AB 1 CT1 36 (SUTURE) ×6 IMPLANT
SUT VIC AB 2-0 CT2 27 (SUTURE) ×3 IMPLANT
SYR 20CC LL (SYRINGE) ×3 IMPLANT
SYR 30ML LL (SYRINGE) ×6 IMPLANT
TIP FAN IRRIG PULSAVAC PLUS (DISPOSABLE) ×1 IMPLANT
TOWEL OR 17X26 4PK STRL BLUE (TOWEL DISPOSABLE) ×3 IMPLANT
TOWER CARTRIDGE SMART MIX (DISPOSABLE) ×3 IMPLANT
WRAPON POLAR PAD KNEE (MISCELLANEOUS) ×3

## 2016-08-09 NOTE — Op Note (Signed)
OPERATIVE NOTE  DATE OF SURGERY:  08/09/2016  PATIENT NAME:  Jill Stanley   DOB: [birth year]  MRN: [id number]  PRE-OPERATIVE DIAGNOSIS: Degenerative arthrosis of the left knee, primary  POST-OPERATIVE DIAGNOSIS:  Same  PROCEDURE:  Left total knee arthroplasty using computer-assisted navigation  SURGEON:  Marciano Sequin. M.D.  ASSISTANT:  Vance Peper, PA (present and scrubbed throughout the case, critical for assistance with exposure, retraction, instrumentation, and closure)  ANESTHESIA: spinal  ESTIMATED BLOOD LOSS: 25 mL  FLUIDS REPLACED: 1000 mL of crystalloid  TOURNIQUET TIME: 75 minutes  DRAINS: 2 medium Hemovac drains  SOFT TISSUE RELEASES: Anterior cruciate ligament, posterior cruciate ligament, deep medial collateral ligament, patellofemoral ligament  IMPLANTS UTILIZED: DePuy Attune size 5N posterior stabilized femoral component (cemented), size 3 rotating platform tibial component (cemented), 35 mm medialized dome patella (cemented), and a 35 mm stabilized rotating platform polyethylene insert.  INDICATIONS FOR SURGERY: Jill Stanley is a 83 y.o. year old female with a long history of progressive knee pain. X-rays demonstrated severe degenerative changes in tricompartmental fashion. The patient had not seen any significant improvement despite conservative nonsurgical intervention. After discussion of the risks and benefits of surgical intervention, the patient expressed understanding of the risks benefits and agree with plans for total knee arthroplasty.   The risks, benefits, and alternatives were discussed at length including but not limited to the risks of infection, bleeding, nerve injury, stiffness, blood clots, the need for revision surgery, cardiopulmonary complications, among others, and they were willing to proceed.  PROCEDURE IN DETAIL: The patient was brought into the operating room and, after adequate spinal anesthesia was achieved, a tourniquet was placed  on the patient's upper thigh. The patient's knee and leg were cleaned and prepped with alcohol and DuraPrep and draped in the usual sterile fashion. A "timeout" was performed as per usual protocol. The lower extremity was exsanguinated using an Esmarch, and the tourniquet was inflated to 300 mmHg. An anterior longitudinal incision was made followed by a standard mid vastus approach. The deep fibers of the medial collateral ligament were elevated in a subperiosteal fashion off of the medial flare of the tibia so as to maintain a continuous soft tissue sleeve. The patella was subluxed laterally and the patellofemoral ligament was incised. Inspection of the knee demonstrated severe degenerative changes with full-thickness loss of articular cartilage. Osteophytes were debrided using a rongeur. Anterior and posterior cruciate ligaments were excised. Two 4.0 mm Schanz pins were inserted in the femur and into the tibia for attachment of the array of trackers used for computer-assisted navigation. Hip center was identified using a circumduction technique. Distal landmarks were mapped using the computer. The distal femur and proximal tibia were mapped using the computer. The distal femoral cutting guide was positioned using computer-assisted navigation so as to achieve a 5 distal valgus cut. The femur was sized and it was felt that a size 5N femoral component was appropriate. A size 5 femoral cutting guide was positioned and the anterior cut was performed and verified using the computer. This was followed by completion of the posterior and chamfer cuts. Femoral cutting guide for the central box was then positioned in the center box cut was performed.  Attention was then directed to the proximal tibia. Medial and lateral menisci were excised. The extramedullary tibial cutting guide was positioned using computer-assisted navigation so as to achieve a 0 varus-valgus alignment and 3 posterior slope. The cut was performed  and verified using the computer. The proximal tibia  was sized and it was felt that a size 3 tibial tray was appropriate. Tibial and femoral trials were inserted followed by insertion of a 5 mm polyethylene insert. This allowed for excellent mediolateral soft tissue balancing both in flexion and in full extension. Finally, the patella was cut and prepared so as to accommodate a 35 mm medialized dome patella. A patella trial was placed and the knee was placed through a range of motion with excellent patellar tracking appreciated. The femoral trial was removed after debridement of posterior osteophytes. The central post-hole for the tibial component was reamed followed by insertion of a keel punch. Tibial trials were then removed. Cut surfaces of bone were irrigated with copious amounts of normal saline with antibiotic solution using pulsatile lavage and then suctioned dry. Polymethylmethacrylate cement was prepared in the usual fashion using a vacuum mixer. Cement was applied to the cut surface of the proximal tibia as well as along the undersurface of a size 3 rotating platform tibial component. Tibial component was positioned and impacted into place. Excess cement was removed using [employer] fast streamer. Cement was then applied to the cut surfaces of the femur as well as along the posterior flanges of the size 5N femoral component. The femoral component was positioned and impacted into place. Excess cement was removed using [employer] fast streamer. A 5 mm polyethylene trial was inserted and the knee was brought into full extension with steady axial compression applied. Finally, cement was applied to the backside of a 35 mm medialized dome patella and the patellar component was positioned and patellar clamp applied. Excess cement was removed using [employer] fast streamer. After adequate curing of the cement, the tourniquet was deflated after a total tourniquet time of 75 minutes. Hemostasis was achieved using electrocautery. The knee was  irrigated with copious amounts of normal saline with antibiotic solution using pulsatile lavage and then suctioned dry. 20 mL of 1.3% Exparel and 60 mL of 0.25% Marcaine in 40 mL of normal saline was injected along the posterior capsule, medial and lateral gutters, and along the arthrotomy site. A 5 mm stabilized rotating platform polyethylene insert was inserted and the knee was placed through a range of motion with excellent mediolateral soft tissue balancing appreciated and excellent patellar tracking noted. 2 medium drains were placed in the wound bed and brought out through separate stab incisions. The medial parapatellar portion of the incision was reapproximated using interrupted sutures of #1 Vicryl. Subcutaneous tissue was approximated in layers using first #0 Vicryl followed #2-0 Vicryl. The skin was approximated with skin staples. A sterile dressing was applied.  The patient tolerated the procedure well and was transported to the recovery room in stable condition.    Nargis Abrams P. Holley Bouche., M.D.

## 2016-08-09 NOTE — Progress Notes (Signed)
Admission: Patient alert and oriented. Surgical dressing, hemovac, and polar care in place. RN applied bone foam. No skin issues other than surgical incision. Lung and heart sounds normal. Patient complaining of minimal pain. Patient oriented to room and call bell system.   Deri Fuelling, RN

## 2016-08-09 NOTE — Anesthesia Procedure Notes (Addendum)
Spinal  Patient location during procedure: OR Start time: 08/09/2016 7:13 AM End time: 08/09/2016 7:21 AM Staffing Anesthesiologist: Randa Lynn, AMY Resident/CRNA: Johnna Acosta Performed: resident/CRNA  Preanesthetic Checklist Completed: patient identified, site marked, surgical consent, pre-op evaluation, timeout performed, IV checked, risks and benefits discussed and monitors and equipment checked Spinal Block Patient position: sitting Prep: ChloraPrep Patient monitoring: heart rate, continuous pulse ox, blood pressure and cardiac monitor Approach: midline Location: L4-5 Injection technique: single-shot Needle Needle type: Whitacre and Introducer  Needle gauge: 25 G Needle length: 9 cm Additional Notes Negative paresthesia. Negative blood return. Positive free-flowing CSF. Expiration date of kit checked and confirmed. Patient tolerated procedure well, without complications.

## 2016-08-09 NOTE — Anesthesia Post-op Follow-up Note (Cosign Needed)
Anesthesia QCDR form completed.        

## 2016-08-09 NOTE — Anesthesia Procedure Notes (Signed)
Date/Time: 08/09/2016 7:28 AM Performed by: Johnna Acosta Pre-anesthesia Checklist: Patient identified, Emergency Drugs available, Suction available, Patient being monitored and Timeout performed Patient Re-evaluated:Patient Re-evaluated prior to inductionOxygen Delivery Method: Simple face mask

## 2016-08-09 NOTE — H&P (Signed)
The patient has been re-examined, and the chart reviewed, and there have been no interval changes to the documented history and physical.    The risks, benefits, and alternatives have been discussed at length. The patient expressed understanding of the risks benefits and agreed with plans for surgical intervention.  Mohit Zirbes P. Durk Carmen, Jr. M.D.    

## 2016-08-09 NOTE — Anesthesia Preprocedure Evaluation (Signed)
Anesthesia Evaluation  Patient identified by MRN, date of birth, ID band Patient awake    Reviewed: Allergy & Precautions, NPO status , Patient's Chart, lab work & pertinent test results  History of Anesthesia Complications (+) history of anesthetic complications (hx of myalgias after surgery in 1989)  Airway Mallampati: II  TM Distance: >3 FB Neck ROM: Full    Dental  (+) Chipped, Implants,  Chipped upper front tooth in bridge:   Pulmonary neg pulmonary ROS, neg sleep apnea, neg COPD,    breath sounds clear to auscultation- rhonchi (-) wheezing      Cardiovascular hypertension, Pt. on medications (-) CAD, (-) Past MI and (-) Cardiac Stents  Rhythm:Regular Rate:Normal - Systolic murmurs and - Diastolic murmurs    Neuro/Psych negative neurological ROS  negative psych ROS   GI/Hepatic Neg liver ROS, PUD, GERD  Medicated,  Endo/Other  negative endocrine ROSneg diabetes  Renal/GU negative Renal ROS     Musculoskeletal  (+) Arthritis ,   Abdominal (+) + obese,   Peds  Hematology negative hematology ROS (+)   Anesthesia Other Findings Past Medical History: No date: Arthritis No date: Complication of anesthesia     Comment: "All my muscles were sore all over my body was              sore the next day.. due to mixture of gases" No date: Endometrial cancer (HCC) No date: GERD (gastroesophageal reflux disease) 2018: Heart murmur     Comment: tiney murmur, no treatment needed No date: Hypertension No date: Ulcer of the stomach caused by bacteria (H. py*     Comment: history of   Reproductive/Obstetrics                             Anesthesia Physical Anesthesia Plan  ASA: II  Anesthesia Plan: Spinal   Post-op Pain Management:    Induction:   PONV Risk Score and Plan: 2 and Propofol and Ondansetron  Airway Management Planned: Natural Airway  Additional Equipment:   Intra-op Plan:     Post-operative Plan:   Informed Consent: I have reviewed the patients History and Physical, chart, labs and discussed the procedure including the risks, benefits and alternatives for the proposed anesthesia with the patient or authorized representative who has indicated his/her understanding and acceptance.   Dental advisory given  Plan Discussed with: Anesthesiologist and CRNA  Anesthesia Plan Comments:         Lab Results  Component Value Date   WBC 8.0 07/26/2016   HGB 13.3 07/26/2016   HCT 38.4 07/26/2016   MCV 87.2 07/26/2016   PLT 278 07/26/2016    Anesthesia Quick Evaluation

## 2016-08-09 NOTE — NC FL2 (Signed)
[place] LEVEL OF CARE SCREENING TOOL     IDENTIFICATION  Patient Name: Jill Stanley Birthdate: February 06, 1932 Sex: female Admission Date (Current Location): 08/09/2016  [place] and [place] Number:  Engineering geologist and Address:  [facility], [address], [place], [place] [address]      Provider Number: [id number]  Attending Physician Name and Address:  Dereck Leep, MD  Relative Name and Phone Number:       Current Level of Care: Hospital Recommended Level of Care: [place] Prior Approval Number:    Date Approved/Denied:   PASRR Number:  [id number])  Discharge Plan: SNF    Current Diagnoses: Patient Active Problem List   Diagnosis Date Noted  . S/P total knee arthroplasty 08/09/2016  . NONSPECIFIC ABN FINDING RAD & OTH EXAM GI TRACT 07/06/2009    Orientation RESPIRATION BLADDER Height & Weight     Self, Time, Situation, Place  Normal Continent Weight: 160 lb (72.6 kg) Height:  4\' 11"  (149.9 cm)  BEHAVIORAL SYMPTOMS/MOOD NEUROLOGICAL BOWEL NUTRITION STATUS   (none)  (none) Continent Diet (Regular Diet )  AMBULATORY STATUS COMMUNICATION OF NEEDS Skin   Extensive Assist Verbally Surgical wounds (Incision: Left Knee )                       Personal Care Assistance Level of Assistance  Bathing, Feeding, Dressing Bathing Assistance: Limited assistance Feeding assistance: Independent Dressing Assistance: Limited assistance     Functional Limitations Info  Sight, Hearing, Speech Sight Info: Adequate Hearing Info: Impaired Speech Info: Adequate    SPECIAL CARE FACTORS FREQUENCY  PT (By licensed PT), OT (By licensed OT)     PT Frequency:  (5) OT Frequency:  (5)            Contractures      Additional Factors Info  Code Status, Allergies Code Status Info:  (Full Code. ) Allergies Info:  (Epinephrine, Ibuprofen, Lovastatin, Morphine, Simvastatin, Tramadol)           Current  Medications (08/09/2016):  This is the current hospital active medication list Current Facility-Administered Medications  Medication Dose Route Frequency Provider Last Rate Last Dose  . 0.9 %  sodium chloride infusion   Intravenous Continuous Hooten, Laurice Record, MD 100 mL/hr at 08/09/16 1401    . acetaminophen (OFIRMEV) IV 1,000 mg  1,000 mg Intravenous Q6H Hooten, Laurice Record, MD   Stopped at 08/09/16 1459  . acetaminophen (TYLENOL) tablet 650 mg  650 mg Oral Q6H PRN Hooten, Laurice Record, MD       Or  . acetaminophen (TYLENOL) suppository 650 mg  650 mg Rectal Q6H PRN Hooten, Laurice Record, MD      . alum & mag hydroxide-simeth (MAALOX/MYLANTA) 200-200-20 MG/5ML suspension 30 mL  30 mL Oral Q4H PRN Hooten, Laurice Record, MD      . bisacodyl (DULCOLAX) suppository 10 mg  10 mg Rectal Daily PRN Hooten, Laurice Record, MD      . calcium carbonate (TUMS - dosed in mg elemental calcium) chewable tablet 500 mg of elemental calcium  500 mg of elemental calcium Oral Daily PRN Hooten, Laurice Record, MD      . calcium-vitamin D (OSCAL WITH D) 500-200 MG-UNIT per tablet 2 tablet  2 tablet Oral BID Hooten, Laurice Record, MD      . ceFAZolin (ANCEF) IVPB 2g/100 mL premix  2 g Intravenous Q6H Hooten, Laurice Record, MD   Stopped at 08/09/16  1431  . diphenhydrAMINE (BENADRYL) 12.5 MG/5ML elixir 12.5-25 mg  12.5-25 mg Oral Q4H PRN Hooten, Laurice Record, MD      . Derrill Memo ON 08/10/2016] enoxaparin (LOVENOX) injection 30 mg  30 mg Subcutaneous Q12H Hooten, Laurice Record, MD      . ferrous sulfate tablet 325 mg  325 mg Oral BID WC Hooten, Laurice Record, MD      . hydrochlorothiazide (HYDRODIURIL) tablet 25 mg  25 mg Oral BID Dereck Leep, MD   25 mg at 08/09/16 1359  . HYDROmorphone (DILAUDID) injection 0.5 mg  0.5 mg Intravenous Q2H PRN Hooten, Laurice Record, MD      . lisinopril (PRINIVIL,ZESTRIL) tablet 20 mg  20 mg Oral BID Hooten, Laurice Record, MD   20 mg at 08/09/16 1400  . magnesium hydroxide (MILK OF MAGNESIA) suspension 30 mL  30 mL Oral Daily PRN Hooten, Laurice Record, MD      .  menthol-cetylpyridinium (CEPACOL) lozenge 3 mg  1 lozenge Oral PRN Hooten, Laurice Record, MD       Or  . phenol (CHLORASEPTIC) mouth spray 1 spray  1 spray Mouth/Throat PRN Hooten, Laurice Record, MD      . metoCLOPramide (REGLAN) tablet 10 mg  10 mg Oral TID AC & HS Hooten, Laurice Record, MD      . ondansetron (ZOFRAN) tablet 4 mg  4 mg Oral Q6H PRN Hooten, Laurice Record, MD       Or  . ondansetron (ZOFRAN) injection 4 mg  4 mg Intravenous Q6H PRN Hooten, Laurice Record, MD      . oxyCODONE (Oxy IR/ROXICODONE) immediate release tablet 5-10 mg  5-10 mg Oral Q4H PRN Hooten, Laurice Record, MD   10 mg at 08/09/16 1400  . pantoprazole (PROTONIX) EC tablet 40 mg  40 mg Oral BID Hooten, Laurice Record, MD   40 mg at 08/09/16 1400  . psyllium (HYDROCIL/METAMUCIL) packet 1 packet  1 packet Oral Daily Hooten, Laurice Record, MD      . senna-docusate (Senokot-S) tablet 1 tablet  1 tablet Oral BID Hooten, Laurice Record, MD   1 tablet at 08/09/16 1400  . sodium phosphate (FLEET) 7-19 GM/118ML enema 1 enema  1 enema Rectal Once PRN Hooten, Laurice Record, MD      . sucralfate (CARAFATE) tablet 1 g  1 g Oral BID Hooten, Laurice Record, MD   1 g at 08/09/16 1401     Discharge Medications: Please see discharge summary for a list of discharge medications.  Relevant Imaging Results:  Relevant Lab Results:   Additional Information  (SSN: [id number])  Sample, Veronia Beets, LCSW

## 2016-08-09 NOTE — Transfer of Care (Signed)
Immediate Anesthesia Transfer of Care Note  Patient: Jill Stanley  Procedure(s) Performed: Procedure(s): COMPUTER ASSISTED TOTAL KNEE ARTHROPLASTY (Left)  Patient Location: PACU  Anesthesia Type:Spinal  Level of Consciousness: awake and alert   Airway & Oxygen Therapy: Patient Spontanous Breathing and Patient connected to face mask oxygen  Post-op Assessment: Report given to RN and Post -op Vital signs reviewed and stable  Post vital signs: Reviewed and stable  Last Vitals:  Vitals:   08/09/16 0605  BP: (!) 184/82  Pulse: 73  Resp: 18  Temp: 36.7 C    Last Pain:  Vitals:   08/09/16 0605  TempSrc: Oral         Complications: No apparent anesthesia complications

## 2016-08-09 NOTE — Evaluation (Signed)
Physical Therapy Evaluation Patient Details Name: Jill Stanley MRN: [id number] DOB: 05/06/1932 Today's Date: 08/09/2016   History of Present Illness  Pt underwent L TKR without reported post-op complications. PT evaluation performed on POD#0.   Clinical Impression  Pt admitted with above diagnosis. Pt currently with functional limitations due to the deficits listed below (see PT Problem List).  Pt demonstrates excellent LLE strength with bed exercises and bed mobility. She is modified independent to go from supine to sitting at EOB. CGA only for transfers and ambulation. She is able to demonstrate good safety/stability with ambulation from bed to recliner. Pt should be appropriate to return home at discharge with husband and [facility] PT. Will need a rolling walker. AAROM is -7 to 82 degrees during evaluation. Pt will benefit from PT services to address deficits in strength, balance, and mobility in order to return to full function at home.     Follow Up Recommendations Home health PT    Equipment Recommendations  Rolling walker with 5" wheels    Recommendations for Other Services       Precautions / Restrictions Precautions Precautions: Knee Required Braces or Orthoses: Knee Immobilizer - Left Restrictions Weight Bearing Restrictions: Yes LLE Weight Bearing: Weight bearing as tolerated      Mobility  Bed Mobility Overal bed mobility: Modified Independent             General bed mobility comments: HOB elevated, use of bed rails. Fair speed/sequencing. Excellent L hip flexion strength  Transfers Overall transfer level: Needs assistance Equipment used: Rolling walker (2 wheeled) Transfers: Sit to/from Stand Sit to Stand: Min guard         General transfer comment: Pt demonstrates good weight shift to LLE. Safe hand placement. Good sequencing  Ambulation/Gait Ambulation/Gait assistance: Min guard Ambulation Distance (Feet): 5 Feet Assistive device: Rolling walker (2  wheeled) Gait Pattern/deviations: Decreased step length - right;Decreased step length - left Gait velocity: Decreased Gait velocity interpretation: <1.8 ft/sec, indicative of risk for recurrent falls General Gait Details: Pt able to ambulate from bed to recliner with rolling walker. Education provided for proper sequencing. Fair weight acceptance to LLE during ambulation. Denies DOE. Good stability noted  Stairs            Wheelchair Mobility    Modified Rankin (Stroke Patients Only)       Balance Overall balance assessment: Needs assistance Sitting-balance support: No upper extremity supported Sitting balance-Leahy Scale: Good     Standing balance support: No upper extremity supported Standing balance-Leahy Scale: Fair Standing balance comment: Able to maintain balance without UE support and decreased weight shift to LLE                             Pertinent Vitals/Pain Pain Assessment: 0-10 Pain Score: 2  Pain Location: L knee Pain Descriptors / Indicators: Aching Pain Intervention(s): Monitored during session;Premedicated before session    Home Living Family/patient expects to be discharged to:: Private residence Living Arrangements: Spouse/significant other Available Help at Discharge: Family Type of Home: House Home Access: Stairs to enter Entrance Stairs-Rails: None (cane hold doorframe) Entrance Stairs-Number of Steps: 1 Home Layout: One level Home Equipment: Shower seat (no assistive device)      Prior Function Level of Independence: Independent         Comments: Independent ambulation without assistive device. No falls. Independent with ADLs/IADLs     Hand Dominance   Dominant Hand: Right  Extremity/Trunk Assessment   Upper Extremity Assessment Upper Extremity Assessment: Overall WFL for tasks assessed    Lower Extremity Assessment Lower Extremity Assessment: LLE deficits/detail LLE Deficits / Details: Reports intact  sensation to light touch. Full DF/PF. Full SLR and SAQ without assistance. RLE grossly WFL       Communication   Communication: No difficulties  Cognition Arousal/Alertness: Awake/alert Behavior During Therapy: WFL for tasks assessed/performed Overall Cognitive Status: Within Functional Limits for tasks assessed                                        General Comments      Exercises Total Joint Exercises Ankle Circles/Pumps: AROM;Both;10 reps;Supine Quad Sets: Strengthening;Both;10 reps;Supine Gluteal Sets: Strengthening;Both;10 reps;Supine Towel Squeeze: Strengthening;Both;10 reps;Supine Short Arc Quad: Strengthening;Left;10 reps;Supine Heel Slides: Strengthening;Left;10 reps;Supine Hip ABduction/ADduction: Strengthening;Left;10 reps;Supine Straight Leg Raises: Strengthening;Left;10 reps;Supine Goniometric ROM: -7 to 82 degrees AAROM, pain limited   Assessment/Plan    PT Assessment Patient needs continued PT services  PT Problem List Decreased strength;Decreased range of motion;Decreased activity tolerance;Decreased balance;Decreased mobility;Pain       PT Treatment Interventions DME instruction;Gait training;Stair training;Functional mobility training;Therapeutic activities;Therapeutic exercise;Balance training;Neuromuscular re-education;Patient/family education;Manual techniques    PT Goals (Current goals can be found in the Care Plan section)  Acute Rehab PT Goals Patient Stated Goal: Return to prior level of function PT Goal Formulation: With patient Time For Goal Achievement: 08/23/16 Potential to Achieve Goals: Good    Frequency BID   Barriers to discharge        Co-evaluation               AM-PAC PT "6 Clicks" Daily Activity  Outcome Measure Difficulty turning over in bed (including adjusting bedclothes, sheets and blankets)?: A Little Difficulty moving from lying on back to sitting on the side of the bed? : A Little Difficulty  sitting down on and standing up from a chair with arms (e.g., wheelchair, bedside commode, etc,.)?: A Little Help needed moving to and from a bed to chair (including a wheelchair)?: A Little Help needed walking in hospital room?: A Little Help needed climbing 3-5 steps with a railing? : A Lot 6 Click Score: 17    End of Session Equipment Utilized During Treatment: Gait belt Activity Tolerance: Patient tolerated treatment well Patient left: in chair;with call bell/phone within reach;with chair alarm set;with SCD's reapplied;Other (comment) (towel roll under heel, polar care in place) Nurse Communication: Mobility status PT Visit Diagnosis: Unsteadiness on feet (R26.81);Muscle weakness (generalized) (M62.81)    Time: 4010-2725 PT Time Calculation (min) (ACUTE ONLY): 21 min   Charges:   PT Evaluation $PT Eval Low Complexity: 1 Procedure PT Treatments $Therapeutic Exercise: 8-22 mins   PT G Codes:   PT G-Codes **NOT FOR INPATIENT CLASS** Functional Assessment Tool Used: AM-PAC 6 Clicks Basic Mobility Functional Limitation: Mobility: Walking and moving around Mobility: Walking and Moving Around Current Status (D6644): At least 40 percent but less than 60 percent impaired, limited or restricted Mobility: Walking and Moving Around Goal Status [phone]): At least 1 percent but less than 20 percent impaired, limited or restricted   Phillips Grout PT, DPT    Grenda Lora 08/09/2016, 4:47 PM

## 2016-08-10 LAB — CBC
HEMATOCRIT: 34.7 % — AB (ref 35.0–47.0)
Hemoglobin: 12 g/dL (ref 12.0–16.0)
MCH: 30.7 pg (ref 26.0–34.0)
MCHC: 34.7 g/dL (ref 32.0–36.0)
MCV: 88.4 fL (ref 80.0–100.0)
Platelets: 227 10*3/uL (ref 150–440)
RBC: 3.92 MIL/uL (ref 3.80–5.20)
RDW: 13.2 % (ref 11.5–14.5)
WBC: 12.1 10*3/uL — AB (ref 3.6–11.0)

## 2016-08-10 LAB — BASIC METABOLIC PANEL
ANION GAP: 7 (ref 5–15)
BUN: 7 mg/dL (ref 6–20)
CO2: 27 mmol/L (ref 22–32)
Calcium: 8.4 mg/dL — ABNORMAL LOW (ref 8.9–10.3)
Chloride: 89 mmol/L — ABNORMAL LOW (ref 101–111)
Creatinine, Ser: 0.54 mg/dL (ref 0.44–1.00)
GFR calc Af Amer: >60 mL/min (ref >60)
GFR calc non Af Amer: >60 mL/min (ref >60)
GLUCOSE: 138 mg/dL — AB (ref 65–99)
POTASSIUM: 2.8 mmol/L — AB (ref 3.5–5.1)
Sodium: 123 mmol/L — ABNORMAL LOW (ref 135–145)

## 2016-08-10 MED ORDER — POTASSIUM CHLORIDE 20 MEQ PO PACK
40.0000 meq | PACK | Freq: Three times a day (TID) | ORAL | Status: AC
  Administered 2016-08-10 (×3): 40 meq via ORAL
  Filled 2016-08-10 (×3): qty 2

## 2016-08-10 NOTE — Discharge Summary (Signed)
Physician Discharge Summary  Patient ID: Jill Stanley MRN: [id number] DOB/AGE: [birth year] 81 y.o.  Admit date: 08/09/2016 Discharge date: 08/11/2016  Admission Diagnoses:  primary osteoarthritis of left knee   Discharge Diagnoses: Patient Active Problem List   Diagnosis Date Noted  . S/P total knee arthroplasty 08/09/2016  . NONSPECIFIC ABN FINDING RAD & OTH EXAM GI TRACT 07/06/2009    Past Medical History:  Diagnosis Date  . Arthritis   . Complication of anesthesia    "All my muscles were sore all over my body was sore the next day.. due to mixture of gases"  . Endometrial cancer ([place])   . GERD (gastroesophageal reflux disease)   . Heart murmur 2018   tiney murmur, no treatment needed  . Hypertension   . Ulcer of the stomach caused by bacteria (H. pylori)    history of     Transfusion: No transfusions during this admission   Consultants (if any):   Discharged Condition: Improved  Hospital Course: Jill Stanley is an 83 y.o. female who was admitted 08/09/2016 with a diagnosis of degenerative arthrosis left knee and went to the operating room on 08/09/2016 and underwent the above named procedures.    Surgeries:Procedure(s): COMPUTER ASSISTED TOTAL KNEE ARTHROPLASTY on 08/09/2016  PRE-OPERATIVE DIAGNOSIS: Degenerative arthrosis of the left knee, primary  POST-OPERATIVE DIAGNOSIS:  Same  PROCEDURE:  Left total knee arthroplasty using computer-assisted navigation  SURGEON:  Marciano Sequin. M.D.  ASSISTANT:  Vance Peper, PA (present and scrubbed throughout the case, critical for assistance with exposure, retraction, instrumentation, and closure)  ANESTHESIA: spinal  ESTIMATED BLOOD LOSS: 25 mL  FLUIDS REPLACED: 1000 mL of crystalloid  TOURNIQUET TIME: 75 minutes  DRAINS: 2 medium Hemovac drains  SOFT TISSUE RELEASES: Anterior cruciate ligament, posterior cruciate ligament, deep medial collateral ligament, patellofemoral ligament  IMPLANTS  UTILIZED: DePuy Attune size 5N posterior stabilized femoral component (cemented), size 3 rotating platform tibial component (cemented), 35 mm medialized dome patella (cemented), and a 35 mm stabilized rotating platform polyethylene insert.  INDICATIONS FOR SURGERY: Jill Stanley is a 83 y.o. year old female with a long history of progressive knee pain. X-rays demonstrated severe degenerative changes in tricompartmental fashion. The patient had not seen any significant improvement despite conservative nonsurgical intervention. After discussion of the risks and benefits of surgical intervention, the patient expressed understanding of the risks benefits and agree with plans for total knee arthroplasty.   The risks, benefits, and alternatives were discussed at length including but not limited to the risks of infection, bleeding, nerve injury, stiffness, blood clots, the need for revision surgery, cardiopulmonary complications, among others, and they were willing to proceed.  Patient tolerated the surgery well. No complications .Patient was taken to PACU where she was stabilized and then transferred to the orthopedic floor.  Patient started on Lovenox 30 mg q 12 hrs. Foot pumps applied bilaterally at 80 mm hgb. Heels elevated off bed with rolled towels. No evidence of DVT. Calves non tender. Negative Homan. Physical therapy started on day #1 for gait training and transfer with OT starting on  day #1 for ADL and assisted devices. Patient has done well with therapy. Ambulated greater than 200 feet upon being discharged.  Patient's IV And Foley were discontinued on day #1 with Hemovac being discontinued on day #2. Dressing was changed on day 2 prior to patient being discharged   She was given perioperative antibiotics:  Anti-infectives    Start     Dose/Rate Route Frequency  Ordered Stop   08/09/16 1300  ceFAZolin (ANCEF) IVPB 2g/100 mL premix     2 g 200 mL/hr over 30 Minutes Intravenous Every 6  hours 08/09/16 1249 08/10/16 1259   08/09/16 0600  ceFAZolin (ANCEF) IVPB 2g/100 mL premix     2 g 200 mL/hr over 30 Minutes Intravenous On call to O.R. 08/08/16 2218 08/09/16 0751   08/09/16 0557  ceFAZolin (ANCEF) 2-4 GM/100ML-% IVPB    Comments:  Ronnell Freshwater   : cabinet override      08/09/16 0557 08/09/16 0736    .  She was fitted with AV 1 compression foot pump devices, instructed on heel pumps, early ambulation, and fitted with TED stockings bilaterally for DVT prophylaxis.  She benefited maximally from the hospital stay and there were no complications.    Recent vital signs:  Vitals:   08/09/16 2037 08/09/16 2156  BP: (!) 118/55 (!) 140/57  Pulse: (!) 57 (!) 57  Resp:    Temp: 98 F (36.7 C)     Recent laboratory studies:  Lab Results  Component Value Date   HGB 12.0 08/10/2016   HGB 13.3 07/26/2016   Lab Results  Component Value Date   WBC 12.1 (H) 08/10/2016   PLT 227 08/10/2016   Lab Results  Component Value Date   INR 0.92 07/26/2016   Lab Results  Component Value Date   NA 123 (L) 08/10/2016   K 2.8 (L) 08/10/2016   CL 89 (L) 08/10/2016   CO2 27 08/10/2016   BUN 7 08/10/2016   CREATININE 0.54 08/10/2016   GLUCOSE 138 (H) 08/10/2016    Discharge Medications:   Allergies as of 08/11/2016      Reactions   Epinephrine Anxiety   Hyperactivity   Ibuprofen Other (See Comments)   Cannot take due to GI ulcer   Lovastatin Diarrhea, Nausea Only, Nausea And Vomiting   Morphine Nausea Only   Simvastatin Diarrhea, Nausea Only, Nausea And Vomiting   Tramadol Nausea Only   Caused nausea, "felt awful all over", and med did not help with pain      Medication List    TAKE these medications   acetaminophen 650 MG CR tablet Commonly known as:  TYLENOL Take 1,300 mg by mouth every 8 (eight) hours as needed for pain.   CALCIUM 600-D 600-400 MG-UNIT Tabs Generic drug:  Calcium Carbonate-Vitamin D3 Take 2 tablets by mouth every evening.   enoxaparin 30  MG/0.3ML injection Commonly known as:  LOVENOX Inject 0.4 mLs (40 mg total) into the skin daily.   esomeprazole 20 MG capsule Commonly known as:  NEXIUM Take 20 mg by mouth daily at 12 noon.   lisinopril-hydrochlorothiazide 20-25 MG tablet Commonly known as:  PRINZIDE,ZESTORETIC Take 1 tablet by mouth 2 (two) times daily.   methylcellulose oral powder Take 1 packet by mouth 2 (two) times daily.   oxyCODONE 5 MG immediate release tablet Commonly known as:  Oxy IR/ROXICODONE Take 1-2 tablets (5-10 mg total) by mouth every 4 (four) hours as needed for severe pain or breakthrough pain.   senna-docusate 8.6-50 MG tablet Commonly known as:  Senokot-S Take 2 tablets by mouth at bedtime.   sodium chloride 0.65 % Soln nasal spray Commonly known as:  OCEAN Place 2 sprays into both nostrils as needed for congestion.   sucralfate 1 g tablet Commonly known as:  CARAFATE Take 1 g by mouth 2 (two) times daily.   TUMS ULTRA 1000 400 MG chewable tablet Generic drug:  calcium elemental as carbonate Chew 1,000 mg by mouth as needed for heartburn.            Durable Medical Equipment        Start     Ordered   08/09/16 1250  DME Walker rolling  Once    Question:  Patient needs a walker to treat with the following condition  Answer:  Total knee replacement status   08/09/16 1249   08/09/16 1250  DME Bedside commode  Once    Question:  Patient needs a bedside commode to treat with the following condition  Answer:  Total knee replacement status   08/09/16 1249      Diagnostic Studies: Dg Knee Left Port  Result Date: 08/09/2016 CLINICAL DATA:  Left knee replacement EXAM: PORTABLE LEFT KNEE - 1-2 VIEW COMPARISON:  None. FINDINGS: The left knee demonstrates a total knee arthroplasty without evidence of hardware failure complication. There is no significant joint effusion. There is no fracture or dislocation. The alignment is anatomic. Surgical drains are present. Post-surgical changes  noted in the surrounding soft tissues. IMPRESSION: Interval left total knee arthroplasty. Electronically Signed   By: Kathreen Devoid   On: 08/09/2016 10:35    Disposition: Final discharge disposition not confirmed    Follow-up Information    Watt Climes, PA On 08/24/2016.   Specialty:  Physician Assistant Why:  at 1:15pm Contact information: [place] [address] [phone]        Dereck Leep, MD On 09/21/2016.   Specialty:  Orthopedic Surgery Why:  at 11:15am Contact information: [place] [address] [phone]            Signed: Watt Climes 08/10/2016, 7:39 AM

## 2016-08-10 NOTE — Evaluation (Signed)
Occupational Therapy Evaluation Patient Details Name: Jill Stanley MRN: [id number] DOB: 11-29-1932 Today's Date: 08/10/2016    History of Present Illness Pt underwent L TKR without reported post-op complications.    Clinical Impression   Pt is 83 year old female s/p L TKR.  Pt was independent in all ADLs prior to surgery and living at home with her husband.  She is eager to return to PLOF.  Pt currently requires min assist for LB dressing while supine in bed due to potassium and intermittent dizziness, pain and limited AROM of L knee.  Pt would benefit from instruction in dressing techniques with or without assistive devices for dressing and bathing skills.  Pt would also benefit from recommendations for home modifications to increase safety in the bathroom and prevent falls. Pt most likely will not need any further OT after discharge home and her husband is able to help as needed.    Follow Up Recommendations  No OT follow up    Equipment Recommendations       Recommendations for Other Services       Precautions / Restrictions Precautions Precautions: Knee Required Braces or Orthoses: Knee Immobilizer - Left Restrictions Weight Bearing Restrictions: Yes LLE Weight Bearing: Weight bearing as tolerated      Mobility Bed Mobility                  Transfers                      Balance                                           ADL either performed or assessed with clinical judgement   ADL Overall ADL's : Needs assistance/impaired Eating/Feeding: Independent;Set up   Grooming: Wash/dry hands;Wash/dry face;Oral care;Applying deodorant;Brushing hair;Independent;Set up           Upper Body Dressing : Independent;Set up   Lower Body Dressing: Set up;Minimal assistance;With adaptive equipment;Bed level Lower Body Dressing Details (indicate cue type and reason): Pt seen in bed due to potassium low and pt c/o milk dizziness when  attempting sitting up EOB.  Pt educated and AD with demonstration and simulation while in bed only.  Will have her try to use AD tomorrow.                      Vision Patient Visual Report: No change from baseline       Perception     Praxis      Pertinent Vitals/Pain Pain Assessment: 0-10 Pain Score: 2  Pain Location: L knee Pain Descriptors / Indicators: Aching Pain Intervention(s): Limited activity within patient's tolerance;Monitored during session;Premedicated before session     Hand Dominance Right   Extremity/Trunk Assessment Upper Extremity Assessment Upper Extremity Assessment: Overall WFL for tasks assessed   Lower Extremity Assessment Lower Extremity Assessment: Defer to PT evaluation       Communication Communication Communication: No difficulties   Cognition Arousal/Alertness: Awake/alert Behavior During Therapy: WFL for tasks assessed/performed Overall Cognitive Status: Within Functional Limits for tasks assessed                                     General Comments       Exercises  Shoulder Instructions      Home Living Family/patient expects to be discharged to:: Private residence Living Arrangements: Spouse/significant other Available Help at Discharge: Family Type of Home: House Home Access: Stairs to enter Technical brewer of Steps: 1 Entrance Stairs-Rails: None Home Layout: One level     Bathroom Shower/Tub: Occupational psychologist: Standard Bathroom Accessibility: Yes   Home Equipment: Shower seat;Toilet riser   Additional Comments: Pt has access to AD like shower chair and BSC of needed due to having a friend who works for a [employer] who has these items.        Prior Functioning/Environment Level of Independence: Independent        Comments: Independent ambulation without assistive device. No falls. Independent with ADLs/IADLs        OT Problem List: Decreased  strength;Decreased range of motion;Decreased activity tolerance;Pain      OT Treatment/Interventions: Self-care/ADL training;DME and/or AE instruction;Therapeutic activities;Patient/family education    OT Goals(Current goals can be found in the care plan section) Acute Rehab OT Goals Patient Stated Goal: "be independent again" OT Goal Formulation: With patient Time For Goal Achievement: 08/24/16 Potential to Achieve Goals: Good ADL Goals Pt Will Perform Lower Body Dressing: with supervision;with adaptive equipment;sit to/from stand (using FWW and no LOB) Pt Will Transfer to Toilet: with set-up;with supervision;regular height toilet (using FWW) Pt Will Perform Toileting - Clothing Manipulation and hygiene: with supervision;with set-up;sit to/from stand  OT Frequency: Min 1X/week   Barriers to D/C:            Co-evaluation              AM-PAC PT "6 Clicks" Daily Activity     Outcome Measure Help from another person eating meals?: None Help from another person taking care of personal grooming?: None Help from another person toileting, which includes using toliet, bedpan, or urinal?: A Little Help from another person bathing (including washing, rinsing, drying)?: A Little Help from another person to put on and taking off regular upper body clothing?: None Help from another person to put on and taking off regular lower body clothing?: A Little 6 Click Score: 21   End of Session    Activity Tolerance: Patient tolerated treatment well Patient left: in bed;with call bell/phone within reach;with bed alarm set  OT Visit Diagnosis: Pain;Muscle weakness (generalized) (M62.81) Pain - Right/Left: Left Pain - part of body: Knee                Time: 5885-0277 OT Time Calculation (min): 30 min Charges:  OT General Charges $OT Visit: 1 Procedure OT Evaluation $OT Eval Low Complexity: 1 Procedure OT Treatments $Self Care/Home Management : 8-22 mins G-Codes:       Chrys Racer, OTR/L ascom [phone] 08/10/16, 10:45 AM

## 2016-08-10 NOTE — Discharge Instructions (Signed)

## 2016-08-10 NOTE — Clinical Social Work Note (Signed)
CSW received referral for SNF.  Case discussed with case manager and plan is to discharge home with home health.  CSW to sign off please re-consult if social work needs arise.  Sarvesh Meddaugh R. Chauntel Windsor, MSW, LCSWA [phone]  

## 2016-08-10 NOTE — Progress Notes (Signed)
PT Cancellation Note  Patient Details Name: Jill Stanley MRN: [id number] DOB: July 18, 1932   Cancelled Treatment:    Reason Eval/Treat Not Completed: Patient not medically ready. Per chart review and discussion with pt's nurse, pt with low potassium (2.8) with symptoms of fatigue, feeling cold and the shakes. Pt being supplemented this morning; will re attempt treatment this afternoon based on any lab results and pt symptoms. Of note, pt's sodium is on the lower side as well per nursing (123)   Larae Grooms, PTA 08/10/2016, 10:08 AM

## 2016-08-10 NOTE — Anesthesia Postprocedure Evaluation (Signed)
Anesthesia Post Note  Patient: Jill Stanley  Procedure(s) Performed: Procedure(s) (LRB): COMPUTER ASSISTED TOTAL KNEE ARTHROPLASTY (Left)  Patient location during evaluation: Nursing Unit Anesthesia Type: Spinal Level of consciousness: awake, awake and alert and oriented Pain management: pain level controlled Vital Signs Assessment: post-procedure vital signs reviewed and stable Respiratory status: spontaneous breathing, nonlabored ventilation and respiratory function stable Cardiovascular status: blood pressure returned to baseline and stable Postop Assessment: no headache and no backache Anesthetic complications: no     Last Vitals:  Vitals:   08/09/16 2037 08/09/16 2156  BP: (!) 118/55 (!) 140/57  Pulse: (!) 57 (!) 57  Resp:    Temp: 36.7 C     Last Pain:  Vitals:   08/10/16 0620  TempSrc:   PainSc: 1                  [employer]

## 2016-08-10 NOTE — Care Management Note (Signed)
Case Management Note  Patient Details  Name: Jill Stanley MRN: [id number] Date of Birth: [birth year]  Subjective/Objective:   POD # 1 Left total Knee arthroplasty. Met with patient at bedside to discuss discharge planning. She lives at home with her spouse who will be her caregiver. She was independent prior to admission. Will need a walker.  Ordered from [facility] PT recommended home PT.  Offered choice of home health agencies.She prefers [facility]. Referral to [facility] for home PT. Pharmacy: [facility] [phone]. Called Lovenox 40 mg # 14 no refills. PCP is Dr. Richarda Overlie. Last seen approximately 3 months ago.                   Action/Plan: It is anticipated patient will discharge on Saturday. [facility] for HHPT, Lovenox called in. Walker from [facility].   Expected Discharge Date:                  Expected Discharge Plan:  [place]  In-House Referral:     Discharge planning Services  CM Consult  Post Acute Care Choice:  Durable Medical Equipment, Home Health Choice offered to:  Patient  DME Arranged:  Walker rolling DME Agency:  [place]:  PT [place] Agency:  [facility]  Status of Service:  In process, will continue to follow  If discussed at Long Length of Stay Meetings, dates discussed:    Additional Comments:  Jolly Mango, RN 08/10/2016, 8:38 AM

## 2016-08-10 NOTE — Progress Notes (Signed)
ORTHOPAEDICS PROGRESS NOTE  PATIENT NAME: Jill Stanley DOB: [birth year]  MRN: [id number]  POD # 1: Left total knee arthroplasty  Subjective: The patient states that the pain is under good control at this point. She denies any nausea. The patient did extremely well with physical therapy yesterday afternoon.  Objective: Vital signs in last 24 hours: Temp:  [97.2 F (36.2 C)-98.8 F (37.1 C)] 98 F (36.7 C) (07/11 2037) Pulse Rate:  [54-66] 57 (07/11 2156) Resp:  [11-24] 16 (07/11 1307) BP: (100-183)/(54-91) 140/57 (07/11 2156) SpO2:  [97 %-100 %] 100 % (07/11 2037)  Intake/Output from previous day: 07/11 0701 - 07/12 0700 In: 3481.7 [I.V.:2881.7; IV Piggyback:600] Out: 2970 [Urine:2775; Drains:170; Blood:25]   Recent Labs  08/10/16 0322  WBC 12.1*  HGB 12.0  HCT 34.7*  PLT 227  K 2.8*  CL 89*  CO2 27  BUN 7  CREATININE 0.54  GLUCOSE 138*  CALCIUM 8.4*    EXAM General: Well-developed well-nourished female seen in no acute distress. Lungs: clear to auscultation Cardiac: normal rate, regular rhythm, normal S1, S2; 2/6 murmur; no rubs, clicks or gallops Left lower extremity: Hemovac drain is in place. Bone foam is in place with the knee fully extended. The dressing is dry and intact. The patient is able to perform an independent straight leg raise. Homans test is negative. Neurologic: Awake, alert, and oriented. Sensory and motor function are grossly intact.  Assessment: Left total knee arthroplasty Hypokalemia Hyponatremia  Secondary diagnoses: Jill Stanley suction reflux disease Hypertension History of endometrial cancer  Plan: Orders were placed for supplemental potassium. The patient tends to be hyponatremic by history. We will continue to follow with labs in the morning. Today's goal were reviewed with the patient.  Continue with physical therapy and occupational therapy as per total knee arthroplasty rehabilitation protocol. Plan is to go Home after  hospital stay. DVT Prophylaxis - Lovenox, Foot Pumps and TED hose  Jill Stanley P. Holley Bouche M.D.

## 2016-08-10 NOTE — Progress Notes (Signed)
Physical Therapy Treatment Patient Details Name: Jill Stanley MRN: [id number] DOB: 1932-05-26 Today's Date: 08/10/2016    History of Present Illness Pt underwent L TKR without reported post-op complications.     PT Comments    Spoke with nurse before attempting treatment. Pt with no new lab results; pt is ordered more potassium supplementation later today. Nursing notes pt has been getting to bedside commode without issue; per pt, pt feels very shaky when up in/out of bed. Complains of fatigue. Pt also notes decreased appetite and nauseated feeling with thoughts of eating. Pain left knee 2/10, which increases to 3 post exercises.  Agreed with nursing to limit treatment to bed level this afternoon. Pt participates in supine stretching and strengthening exercises with minimal A required once educated in technique; several cues for correction. Range left knee in supine 0-70 degrees. Continue PT as able per lab results and pt symptoms to progress range, strength, endurance to improve all functional mobility.   Follow Up Recommendations  Home health PT     Equipment Recommendations  Rolling walker with 5" wheels    Recommendations for Other Services       Precautions / Restrictions Precautions Precautions: Knee Required Braces or Orthoses:  (Can straight leg raise without lag > 10x) Restrictions Weight Bearing Restrictions: Yes LLE Weight Bearing: Weight bearing as tolerated    Mobility  Bed Mobility               General bed mobility comments: Not tested due to low potassium and shakiness with up in/out of bed. Also notes inability to eat due to nauseated feeling with thought of eating  Transfers                    Ambulation/Gait                 Stairs            Wheelchair Mobility    Modified Rankin (Stroke Patients Only)       Balance                                            Cognition Arousal/Alertness:  Awake/alert Behavior During Therapy: WFL for tasks assessed/performed Overall Cognitive Status: Within Functional Limits for tasks assessed                                        Exercises Total Joint Exercises Ankle Circles/Pumps: AROM;Both;20 reps;Supine Quad Sets: Strengthening;Both;20 reps;Supine (LLE in bone foam) Short Arc Quad: Strengthening;Left;20 reps;Supine Heel Slides: AROM;AAROM;Left;20 reps;Supine (with 10 second stretch each) Hip ABduction/ADduction: AROM;Left;10 reps;Supine;Strengthening Straight Leg Raises: Strengthening;Left;10 reps;Supine (2 sets) Goniometric ROM: 0-70 supine    General Comments        Pertinent Vitals/Pain Pain Assessment: 0-10 Pain Score: 2  (increases to a 3 with stretching) Pain Location: L knee Pain Descriptors / Indicators: Dull Pain Intervention(s): Monitored during session;Ice applied    Home Living                      Prior Function            PT Goals (current goals can now be found in the care plan section) Progress towards PT goals: PT to reassess next treatment  Frequency    BID      PT Plan Current plan remains appropriate    Co-evaluation              AM-PAC PT "6 Clicks" Daily Activity  Outcome Measure  Difficulty turning over in bed (including adjusting bedclothes, sheets and blankets)?: A Little Difficulty moving from lying on back to sitting on the side of the bed? : A Little Difficulty sitting down on and standing up from a chair with arms (e.g., wheelchair, bedside commode, etc,.)?: A Little Help needed moving to and from a bed to chair (including a wheelchair)?: A Little Help needed walking in hospital room?: A Little Help needed climbing 3-5 steps with a railing? : A Lot 6 Click Score: 17    End of Session   Activity Tolerance: Patient limited by fatigue;Other (comment) (low potassium) Patient left: in bed;with call bell/phone within reach;with bed alarm set;with  SCD's reapplied;Other (comment) (polar care and bone foam) Nurse Communication: Other (comment) (appropriateness for treatment) PT Visit Diagnosis: Unsteadiness on feet (R26.81);Muscle weakness (generalized) (M62.81)     Time: 3419-6222 PT Time Calculation (min) (ACUTE ONLY): 20 min  Charges:  $Therapeutic Exercise: 8-22 mins                    G Codes:        Larae Grooms, PTA 08/10/2016, 3:18 PM

## 2016-08-11 LAB — BASIC METABOLIC PANEL
ANION GAP: 8 (ref 5–15)
CALCIUM: 9 mg/dL (ref 8.9–10.3)
CO2: 28 mmol/L (ref 22–32)
Chloride: 84 mmol/L — ABNORMAL LOW (ref 101–111)
Creatinine, Ser: 0.4 mg/dL — ABNORMAL LOW (ref 0.44–1.00)
GFR calc Af Amer: >60 mL/min (ref >60)
GLUCOSE: 145 mg/dL — AB (ref 65–99)
POTASSIUM: 3.8 mmol/L (ref 3.5–5.1)
SODIUM: 120 mmol/L — AB (ref 135–145)

## 2016-08-11 LAB — CBC
HCT: 34.2 % — ABNORMAL LOW (ref 35.0–47.0)
Hemoglobin: 12.2 g/dL (ref 12.0–16.0)
MCH: 31 pg (ref 26.0–34.0)
MCHC: 35.7 g/dL (ref 32.0–36.0)
MCV: 86.7 fL (ref 80.0–100.0)
PLATELETS: 246 10*3/uL (ref 150–440)
RBC: 3.95 MIL/uL (ref 3.80–5.20)
RDW: 13.1 % (ref 11.5–14.5)
WBC: 14 10*3/uL — AB (ref 3.6–11.0)

## 2016-08-11 MED ORDER — ENOXAPARIN SODIUM 30 MG/0.3ML ~~LOC~~ SOLN: 40.0000 mg | SUBCUTANEOUS | 0 refills | Status: DC

## 2016-08-11 MED ORDER — LACTULOSE 10 GM/15ML PO SOLN
10.0000 g | Freq: Two times a day (BID) | ORAL | Status: DC | PRN
  Administered 2016-08-11: 10 g via ORAL
  Filled 2016-08-11: qty 30

## 2016-08-11 MED ORDER — OXYCODONE HCL 5 MG PO TABS: 5.0000 mg | ORAL_TABLET | ORAL | 0 refills | Status: DC | PRN

## 2016-08-11 NOTE — Progress Notes (Signed)
Physical Therapy Treatment Patient Details Name: Jill Stanley MRN: [id number] DOB: [birth year] Today's Date: 08/11/2016    History of Present Illness Pt underwent L TKR without reported post-op complications.     PT Comments    Pt able to ambulate nursing loop with RW safely with CGA during this session. Pt denies L knee pain beginning and end of session with no increase in L knee pain reported during session (pt reports minimal soreness). Pt required verbal cueing and demonstration initially for heel strike during ambulation but was able to recall and demonstrate proper form and technique throughout the rest of the session. Pt able to recall and demonstrate proper form for sit-to-stand from the morning session.   Follow Up Recommendations  Home health PT     Equipment Recommendations  Rolling walker with 5" wheels    Recommendations for Other Services       Precautions / Restrictions Precautions Precautions: Knee;Fall Restrictions Weight Bearing Restrictions: Yes LLE Weight Bearing: Weight bearing as tolerated    Mobility  Bed Mobility Overal bed mobility: Modified Independent Bed Mobility: Sit to Supine       Sit to supine: Modified independent (Device/Increase time);HOB elevated   General bed mobility comments: pt able to perform sit-to-supine with increased time and effort with [facility] elevated  Transfers Overall transfer level: Needs assistance Equipment used: Rolling walker (2 wheeled) Transfers: Sit to/from Stand Sit to Stand: Min guard         General transfer comment: pt able to recall cueing from morning session and perform sit-to-stand with RW with CGA  Ambulation/Gait Ambulation/Gait assistance: Min guard Ambulation Distance (Feet): 200 Feet Assistive device: Rolling walker (2 wheeled)   Gait velocity: Decreased   General Gait Details: pt able to ambulate around the nursing station and back to bed with RW and CGA with initial verbal cueing for heel  strike achievement; after initial cueing pt able to perform heel strike the rest of the session   Stairs            Wheelchair Mobility    Modified Rankin (Stroke Patients Only)       Balance Overall balance assessment: Needs assistance Sitting-balance support: No upper extremity supported;Feet supported Sitting balance-Leahy Scale: Good Sitting balance - Comments: pt able to perform static sitting balance with no UE supported with no overt loss of balance   Standing balance support: No upper extremity supported Standing balance-Leahy Scale: Good Standing balance comment: pt able to reach outside of base of support with no loss of balance to adjust padding and remove her slippers from bed before sitting on EOB with CGA for safety                            Cognition Arousal/Alertness: Awake/alert Behavior During Therapy: WFL for tasks assessed/performed Overall Cognitive Status: Within Functional Limits for tasks assessed                                        Exercises Total Joint Exercises Ankle Circles/Pumps: AROM;Strengthening;Both;10 reps;Supine (HOB elevated) Quad Sets: AROM;Strengthening;Left;10 reps;Supine (HOB elevated) Short Arc Quad: AROM;Strengthening;Left;10 reps;Supine (HOB elevated) Heel Slides: AROM;Strengthening;Left;10 reps;Supine (HOB elevated) Straight Leg Raises: AROM;Strengthening;Left;10 reps;Supine (HOB elevated)    General Comments        Pertinent Vitals/Pain Pain Score: 0-No pain Pain Descriptors / Indicators: Sore (pt reports no pain  but mild soreness of the L knee) Pain Intervention(s): Monitored during session;Repositioned;Ice applied  HR - 76 to 82 bpm throughout session (taken manually)    Home Living                      Prior Function            PT Goals (current goals can now be found in the care plan section) Acute Rehab PT Goals Patient Stated Goal: to be able to ambulate and go  home PT Goal Formulation: With patient Time For Goal Achievement: 08/23/16 Potential to Achieve Goals: Good Progress towards PT goals: Progressing toward goals    Frequency    BID      PT Plan Current plan remains appropriate    Co-evaluation              AM-PAC PT "6 Clicks" Daily Activity  Outcome Measure  Difficulty turning over in bed (including adjusting bedclothes, sheets and blankets)?: A Little Difficulty moving from lying on back to sitting on the side of the bed? : A Little Difficulty sitting down on and standing up from a chair with arms (e.g., wheelchair, bedside commode, etc,.)?: Total Help needed moving to and from a bed to chair (including a wheelchair)?: A Little Help needed walking in hospital room?: A Little Help needed climbing 3-5 steps with a railing? : A Little 6 Click Score: 16    End of Session Equipment Utilized During Treatment: Gait belt Activity Tolerance: Patient tolerated treatment well Patient left: in bed;with call bell/phone within reach;with bed alarm set;with nursing/sitter in room;with SCD's reapplied (L heel elevated via bone foam; R heel elevated via towel roll; SCD in place and activated; polar care in place and activated) Nurse Communication: Mobility status;Precautions;Weight bearing status (via whiteboard) PT Visit Diagnosis: Unsteadiness on feet (R26.81);Muscle weakness (generalized) (M62.81);Other abnormalities of gait and mobility (R26.89)     Time: 6579-0383 PT Time Calculation (min) (ACUTE ONLY): 25 min  Charges:                      G CodesLaqueta Carina, SPT 2016/09/07,3:48 PM [phone]

## 2016-08-11 NOTE — Progress Notes (Signed)
Physical Therapy Treatment Patient Details Name: Jill Stanley MRN: [id number] DOB: Jan 10, 1933 Today's Date: 08/11/2016    History of Present Illness Pt underwent L TKR without reported post-op complications.     PT Comments    Pt able to ambulate nursing loop with RW and navigate stairs safely with CGA during this session. Pt reports minimal L knee pain (2/10) beginning and end of session with no increase in L knee pain reported during session. Pt required verbal cueing and demonstration initially for ambulation and gait but was able to recall and demonstrate proper form and technique throughout the session.    Follow Up Recommendations  Home health PT     Equipment Recommendations  Rolling walker with 5" wheels    Recommendations for Other Services       Precautions / Restrictions Precautions Precautions: Knee;Fall Required Braces or Orthoses:  (AROM SLR performed x10; no knee immobilizer) Restrictions Weight Bearing Restrictions: Yes LLE Weight Bearing: Weight bearing as tolerated    Mobility  Bed Mobility Overal bed mobility: Modified Independent Bed Mobility: Supine to Sit;Sit to Supine     Supine to sit: Modified independent (Device/Increase time) Sit to supine: Modified independent (Device/Increase time)   General bed mobility comments: pt able to perform supine-to/from-sit with increased time and effort  Transfers Overall transfer level: Needs assistance Equipment used: Rolling walker (2 wheeled) Transfers: Sit to/from Stand Sit to Stand: Min guard         General transfer comment: pt required vc's initially for hand placement during sit-to-stand but was able to demonstrate proper technique after initial cueing  Ambulation/Gait Ambulation/Gait assistance: Min guard Ambulation Distance (Feet):  (pt able to ambulate 50 ft to the gym, perform stair exercises, then ambulate 200 ft from gym around the nursing station and back to pt room) Assistive device:  Rolling walker (2 wheeled) Gait Pattern/deviations: Step-to pattern;Decreased stance time - left Gait velocity: Decreased   General Gait Details: pt able to ambulate >125 ft with RW and CGA for pt safety with no increase in L knee pain reported; verbal cueing initially for proper heel strike but pt demonstrated understanding throughout session   Stairs Stairs: Yes   Stair Management: With walker   General stair comments: pt able perform 3 reps of stair exercises; rep 1 and 2 pt required minimal verbal cueing and demonstration initially for proper form and placement of the RW while ascending/descending 1 stair (up backwards, down forwards); rep 3 pt required verbal cueing and demostration for proper form and hand placement on railing to ascend/descend 4 stairs sideways using the left railing with CGA  Wheelchair Mobility    Modified Rankin (Stroke Patients Only)       Balance Overall balance assessment: Needs assistance Sitting-balance support: No upper extremity supported;Feet supported Sitting balance-Leahy Scale: Good Sitting balance - Comments: pt able to perform static sitting balance with no UE supported with no overt loss of balance   Standing balance support: No upper extremity supported Standing balance-Leahy Scale: Good Standing balance comment: pt able to reach outside of base of support with no loss of balance to adjust bedding with CGA for safety                            Cognition Arousal/Alertness: Awake/alert Behavior During Therapy: [facility] for tasks assessed/performed Overall Cognitive Status: Within Functional Limits for tasks assessed  Exercises Total Joint Exercises Quad Sets: AROM;Strengthening;Left;10 reps;Supine (HOB elevated) Short Arc Quad: AROM;Strengthening;Left;10 reps;Supine (HOB elevated) Heel Slides: AROM;Strengthening;Left;10 reps;Supine (HOB elevated) Straight Leg Raises:  AROM;Strengthening;Left;10 reps;Supine (HOB slightly elevated; able to perform well, no knee immobilizer applied) Goniometric ROM: L knee extension 3 degrees in supine; L knee flexion 92 degrees on edge of chair    General Comments        Pertinent Vitals/Pain Pain Assessment: 0-10 Pain Score: 2  Pain Location: L knee Pain Descriptors / Indicators: Dull;Sore Pain Intervention(s): Limited activity within patient's tolerance;Monitored during session;Premedicated before session;Repositioned;Ice applied  HR - 82 to 98 bpm throughout session via pulse ox O2 - 95 to 98% on RA throughout session via pulse ox    Home Living                      Prior Function            PT Goals (current goals can now be found in the care plan section) Acute Rehab PT Goals Patient Stated Goal: to be able to ambulate and go home PT Goal Formulation: With patient Time For Goal Achievement: 08/23/16 Potential to Achieve Goals: Good Progress towards PT goals: Progressing toward goals    Frequency    BID      PT Plan Current plan remains appropriate    Co-evaluation              AM-PAC PT "6 Clicks" Daily Activity  Outcome Measure  Difficulty turning over in bed (including adjusting bedclothes, sheets and blankets)?: A Little Difficulty moving from lying on back to sitting on the side of the bed? : A Little Difficulty sitting down on and standing up from a chair with arms (e.g., wheelchair, bedside commode, etc,.)?: Total Help needed moving to and from a bed to chair (including a wheelchair)?: A Little Help needed walking in hospital room?: A Little Help needed climbing 3-5 steps with a railing? : A Little 6 Click Score: 16    End of Session Equipment Utilized During Treatment: Gait belt Activity Tolerance: Patient tolerated treatment well;No increased pain Patient left: in chair;with call bell/phone within reach;with chair alarm set;with SCD's reapplied (B heels elevated via  towel rolls; SCD's in place and activated appropriately; polar care in place and actived appropriately) Nurse Communication: Mobility status;Precautions;Weight bearing status (via whiteboard) PT Visit Diagnosis: Unsteadiness on feet (R26.81);Muscle weakness (generalized) (M62.81);Other abnormalities of gait and mobility (R26.89)     Time: 0920-1001 PT Time Calculation (min) (ACUTE ONLY): 41 min  Charges:                       G CodesLaqueta Carina, SPT 09/09/2016,10:51 AM [phone]

## 2016-08-11 NOTE — Progress Notes (Signed)
   Subjective: 2 Days Post-Op Procedure(s) (LRB): COMPUTER ASSISTED TOTAL KNEE ARTHROPLASTY (Left) Patient reports pain as mild.   Patient is well, and has had no acute complaints or problems Continue physical therapy. Patient did not do real well yesterday. Basically bed exercises. States she was traveling and shaking all day. Patient felt it was secondary to her hypokalemia.  Plan is to go Home after hospital stay. no nausea and no vomiting Patient denies any chest pains or shortness of breath. Objective: Vital signs in last 24 hours: Temp:  [98.3 F (36.8 C)-98.6 F (37 C)] 98.6 F (37 C) (07/13 0739) Pulse Rate:  [76-80] 77 (07/13 0739) Resp:  [18] 18 (07/13 0739) BP: (155-162)/(65-66) 162/66 (07/13 0739) SpO2:  [94 %-97 %] 97 % (07/13 0739) well approximated incision Heels are non tender and elevated off the bed using rolled towels Intake/Output from previous day: 07/12 0701 - 07/13 0700 In: 720 [P.O.:720] Out: 110 [Drains:110] Intake/Output this shift: No intake/output data recorded.   Recent Labs  08/10/16 0322 08/11/16 0335  HGB 12.0 12.2    Recent Labs  08/10/16 0322 08/11/16 0335  WBC 12.1* 14.0*  RBC 3.92 3.95  HCT 34.7* 34.2*  PLT 227 246    Recent Labs  08/10/16 0322 08/11/16 0335  NA 123* 120*  K 2.8* 3.8  CL 89* 84*  CO2 27 28  BUN 7 <5*  CREATININE 0.54 0.40*  GLUCOSE 138* 145*  CALCIUM 8.4* 9.0   No results for input(s): LABPT, INR in the last 72 hours.  EXAM General - Patient is Alert, Appropriate and Oriented Extremity - Neurologically intact Neurovascular intact Sensation intact distally Intact pulses distally Dorsiflexion/Plantar flexion intact No cellulitis present Compartment soft Dressing - moderate drainage Motor Function - intact, moving foot and toes well on exam.    Past Medical History:  Diagnosis Date  . Arthritis   . Complication of anesthesia    "All my muscles were sore all over my body was sore the next  day.. due to mixture of gases"  . Endometrial cancer ([place])   . GERD (gastroesophageal reflux disease)   . Heart murmur 2018   tiney murmur, no treatment needed  . Hypertension   . Ulcer of the stomach caused by bacteria (H. pylori)    history of    Assessment/Plan: 2 Days Post-Op Procedure(s) (LRB): COMPUTER ASSISTED TOTAL KNEE ARTHROPLASTY (Left) Active Problems:   S/P total knee arthroplasty  Estimated body mass index is 32.32 kg/m as calculated from the following:   Height as of this encounter: 4\' 11"  (1.499 m).   Weight as of this encounter: 72.6 kg (160 lb). Up with therapy Discharge home with home health  Labs: Were reviewed and acceptable DVT Prophylaxis - Lovenox, Foot Pumps and TED hose Weight-Bearing as tolerated to left leg Patient needs to have a bowel movement today. Please wash the operative leg and apply TED stockings after changing of the dressing. Plan is for patient to go home this afternoon. Bone foam will need to go with patient. Please give patient 2 extra honeycomb dressings to take home.  Jillyn Ledger. [place] 08/11/2016, 8:17 AM

## 2016-08-11 NOTE — Progress Notes (Signed)
Husband here to take patient home. NT took patient to car in w/c.

## 2016-08-11 NOTE — Care Management Note (Signed)
Case Management Note  Patient Details  Name: Jill Stanley MRN: [id number] Date of Birth: 06-Jan-1933  Subjective/Objective:  Discharging today    Action/Plan:  Cost of Lovenox is $ 76.38 DME delivered. [facility] notified of discharge.   Expected Discharge Date:  08/11/16               Expected Discharge Plan:  [place]  In-House Referral:     Discharge planning Services  CM Consult  Post Acute Care Choice:  Durable Medical Equipment, Home Health Choice offered to:  Patient  DME Arranged:  Walker rolling DME Agency:  [place]:  PT [place] Agency:  [facility] (formerly [facility])  Status of Service:  Completed, signed off  If discussed at [employer] of Stay Meetings, dates discussed:    Additional Comments:  Jolly Mango, RN 08/11/2016, 8:31 AM

## 2016-08-11 NOTE — Care Management Important Message (Addendum)
Important Message  Patient Details  Name: Jill Stanley MRN: [id number] Date of Birth: Nov 04, 1932   Medicare Important Message Given: YES   Jolly Mango, RN 08/11/2016, 8:32 AM

## 2016-08-11 NOTE — Progress Notes (Signed)
Pt is alert and oriented.  Up to bedside commode with assistance. Hemovac and polar care in place. Knee extended with bone foam in place. Dressing dry and intact. Pain controlled

## 2016-08-11 NOTE — Progress Notes (Signed)
Patient is being discharged this afternoon following PT. Jill Stanley will follow this patient for PT at home. IV removed, belongings packed. DC & Rx instructions given and patient acknowledged instructions. Patient did not have any further questions.

## 2016-09-24 DIAGNOSIS — M48061 Spinal stenosis, lumbar region without neurogenic claudication: Secondary | ICD-10-CM | POA: Insufficient documentation

## 2017-06-04 ENCOUNTER — Telehealth: Payer: Self-pay

## 2017-06-04 NOTE — Telephone Encounter (Signed)
Error

## 2018-05-06 ENCOUNTER — Encounter: Payer: Self-pay | Admitting: Emergency Medicine

## 2018-05-06 ENCOUNTER — Other Ambulatory Visit: Payer: Self-pay

## 2018-05-06 ENCOUNTER — Emergency Department
Admission: EM | Admit: 2018-05-06 | Discharge: 2018-05-06 | Disposition: A | Discharge status: Home / Self Care | Payer: Medicare PPO | Attending: Emergency Medicine | Admitting: Emergency Medicine

## 2018-05-06 ENCOUNTER — Telehealth: Payer: Self-pay | Admitting: Cardiovascular Disease

## 2018-05-06 DIAGNOSIS — E871 Hypo-osmolality and hyponatremia: Secondary | ICD-10-CM | POA: Diagnosis not present

## 2018-05-06 DIAGNOSIS — I10 Essential (primary) hypertension: Secondary | ICD-10-CM | POA: Diagnosis not present

## 2018-05-06 DIAGNOSIS — Z96652 Presence of left artificial knee joint: Secondary | ICD-10-CM | POA: Insufficient documentation

## 2018-05-06 DIAGNOSIS — Z79899 Other long term (current) drug therapy: Secondary | ICD-10-CM | POA: Insufficient documentation

## 2018-05-06 LAB — CBC WITH DIFFERENTIAL/PLATELET
Abs Immature Granulocytes: 0.02 10*3/uL (ref 0.00–0.07)
Basophils Absolute: 0 10*3/uL (ref 0.0–0.1)
Basophils Relative: 1 %
Eosinophils Absolute: 0 10*3/uL (ref 0.0–0.5)
Eosinophils Relative: 0 %
HCT: 40 % (ref 36.0–46.0)
Hemoglobin: 13.7 g/dL (ref 12.0–15.0)
Immature Granulocytes: 0 %
Lymphocytes Relative: 31 %
Lymphs Abs: 2.7 10*3/uL (ref 0.7–4.0)
MCH: 30.4 pg (ref 26.0–34.0)
MCHC: 34.3 g/dL (ref 30.0–36.0)
MCV: 88.7 fL (ref 80.0–100.0)
Monocytes Absolute: 0.6 10*3/uL (ref 0.1–1.0)
Monocytes Relative: 7 %
Neutro Abs: 5.3 10*3/uL (ref 1.7–7.7)
Neutrophils Relative %: 61 %
Platelets: 267 10*3/uL (ref 150–400)
RBC: 4.51 MIL/uL (ref 3.87–5.11)
RDW: 13.4 % (ref 11.5–15.5)
WBC: 8.6 10*3/uL (ref 4.0–10.5)
nRBC: 0 % (ref 0.0–0.2)

## 2018-05-06 LAB — BASIC METABOLIC PANEL
Anion gap: 12 (ref 5–15)
BUN: 15 mg/dL (ref 8–23)
CO2: 23 mmol/L (ref 22–32)
Calcium: 9.7 mg/dL (ref 8.9–10.3)
Chloride: 92 mmol/L — ABNORMAL LOW (ref 98–111)
Creatinine, Ser: 0.88 mg/dL (ref 0.44–1.00)
GFR calc Af Amer: >60 mL/min (ref >60)
GFR calc non Af Amer: >60 mL/min (ref >60)
Glucose, Bld: 108 mg/dL — ABNORMAL HIGH (ref 70–99)
Potassium: 4.1 mmol/L (ref 3.5–5.1)
Sodium: 127 mmol/L — ABNORMAL LOW (ref 135–145)

## 2018-05-06 LAB — TROPONIN I: Troponin I: <0.03 ng/mL (ref <0.03)

## 2018-05-06 NOTE — ED Provider Notes (Signed)
[facility] Emergency Department Provider Note    ____________________________________________   I have reviewed the triage vital signs and the nursing notes.   HISTORY  Chief Complaint Hypertension   History limited by: Not Limited   HPI Jill Stanley is a 85 y.o. female who presents to the emergency department today because of concerns for feeling unwell and high blood pressure.  The patient states that she has been working with her primary care doctor with her blood pressure.  She is concerned however that part of the reason why she is feeling unwell is because of the side effects of her blood pressure medications.  She apparently tried calling other doctors offices to see if they would see her to change her blood pressure medications.  She has a hard time explaining how she feels unwell. She does say that she felt like she might pass out at one point.  She denies any chest pain or shortness of breath.  Denies any recent fevers.  Denies any nausea vomiting or diarrhea.   Records reviewed. Per medical record review patient has a history of HTN.   Past Medical History:  Diagnosis Date  . Arthritis   . Complication of anesthesia    "All my muscles were sore all over my body was sore the next day.. due to mixture of gases"  . Endometrial cancer ([place])   . GERD (gastroesophageal reflux disease)   . Heart murmur 2018   tiney murmur, no treatment needed  . Hypertension   . Ulcer of the stomach caused by bacteria (H. pylori)    history of    Patient Active Problem List   Diagnosis Date Noted  . S/P total knee arthroplasty 08/09/2016  . NONSPECIFIC ABN FINDING RAD & OTH EXAM GI TRACT 07/06/2009    Past Surgical History:  Procedure Laterality Date  . ABDOMINAL HYSTERECTOMY  1992  . BACK SURGERY  1989   L3, 4 and 5  . DILATION AND CURETTAGE OF UTERUS  1992  . KNEE ARTHROPLASTY Left 08/09/2016   Procedure: COMPUTER ASSISTED TOTAL KNEE ARTHROPLASTY;   Surgeon: Dereck Leep, MD;  Location: [facility] ORS;  Service: Orthopedics;  Laterality: Left;  . KNEE ARTHROSCOPY W/ MENISCAL REPAIR Left     Prior to Admission medications   Medication Sig Start Date End Date Taking? Authorizing Provider  acetaminophen (TYLENOL) 650 MG CR tablet Take 1,300 mg by mouth every 8 (eight) hours as needed for pain.    [provider]  Calcium Carbonate-Vitamin D3 (CALCIUM 600-D) 600-400 MG-UNIT TABS Take 2 tablets by mouth every evening.    [provider]  calcium elemental as carbonate (TUMS ULTRA 1000) 400 MG chewable tablet Chew 1,000 mg by mouth as needed for heartburn.    [provider]  enoxaparin (LOVENOX) 30 MG/0.3ML injection Inject 0.4 mLs (40 mg total) into the skin daily. 08/11/16   Watt Climes, PA  esomeprazole (NEXIUM) 20 MG capsule Take 20 mg by mouth daily at 12 noon.    [provider]  lisinopril-hydrochlorothiazide (PRINZIDE,ZESTORETIC) 20-25 MG tablet Take 1 tablet by mouth 2 (two) times daily. 04/03/16   [provider]  methylcellulose oral powder Take 1 packet by mouth 2 (two) times daily.    [provider]  oxyCODONE (OXY IR/ROXICODONE) 5 MG immediate release tablet Take 1-2 tablets (5-10 mg total) by mouth every 4 (four) hours as needed for severe pain or breakthrough pain. 08/11/16   Watt Climes, PA  senna-docusate (  SENOKOT-S) 8.6-50 MG tablet Take 2 tablets by mouth at bedtime.    [provider]  sodium chloride (OCEAN) 0.65 % SOLN nasal spray Place 2 sprays into both nostrils as needed for congestion.    [provider]  sucralfate (CARAFATE) 1 g tablet Take 1 g by mouth 2 (two) times daily.    [provider]    Allergies Epinephrine; Ibuprofen; Lovastatin; Morphine; Simvastatin; and Tramadol  Family History  Problem Relation Age of Onset  . Breast cancer Daughter 45    Social History Social History   Tobacco Use  . Smoking status: Never Smoker  .  Smokeless tobacco: Never Used  Substance Use Topics  . Alcohol use: Yes    Comment: a glass of wine 2-3 times a year  . Drug use: No    Review of Systems Constitutional: No fever/chills Eyes: No visual changes. ENT: No sore throat. Cardiovascular: Denies chest pain. Respiratory: Denies shortness of breath. Gastrointestinal: No abdominal pain.  No nausea, no vomiting.  No diarrhea.   Genitourinary: Negative for dysuria. Musculoskeletal: Negative for back pain. Skin: Negative for rash. Neurological: Dizziness.   ____________________________________________   PHYSICAL EXAM:  VITAL SIGNS: ED Triage Vitals  Enc Vitals Group     BP 05/06/18 1949 (!) 226/88     Pulse Rate 05/06/18 1949 84     Resp 05/06/18 1949 18     Temp 05/06/18 1949 97.7 F (36.5 C)     Temp Source 05/06/18 1949 Oral     SpO2 05/06/18 1949 96 %     Weight 05/06/18 1950 160 lb (72.6 kg)     Height 05/06/18 1950 4\' 11"  (1.499 m)     Head Circumference --      Peak Flow --      Pain Score 05/06/18 1950 0   Constitutional: Alert and oriented.  Eyes: Conjunctivae are normal.  ENT      Head: Normocephalic and atraumatic.      Nose: No congestion/rhinnorhea.      Mouth/Throat: Mucous membranes are moist.      Neck: No stridor. Hematological/Lymphatic/Immunilogical: No cervical lymphadenopathy. Cardiovascular: Normal rate, regular rhythm.  No murmurs, rubs, or gallops.  Respiratory: Normal respiratory effort without tachypnea nor retractions. Breath sounds are clear and equal bilaterally. No wheezes/rales/rhonchi. Gastrointestinal: Soft and non tender. No rebound. No guarding.  Genitourinary: Deferred Musculoskeletal: Normal range of motion in all extremities. No lower extremity edema. Neurologic:  Normal speech and language. No gross focal neurologic deficits are appreciated.  Skin:  Skin is warm, dry and intact. No rash noted. Psychiatric: Mood and affect are normal. Speech and behavior are normal.  Patient exhibits appropriate insight and judgment.  ____________________________________________    LABS (pertinent positives/negatives)  Trop <0.03 CBC wbc 8.6, hgb 13.7, plt 267 BMP na 127, cl 92, glu 108 otherwise wnl  ____________________________________________   EKG  I, Nance Pear, attending physician, personally viewed and interpreted this EKG  EKG Time: 1954 Rate: 80 Rhythm: normal sinus rhythm Axis: left axis deviation Intervals: qtc 410 QRS: narrow ST changes: no st elevation Impression: abnormal ekg  ____________________________________________    RADIOLOGY  None  ____________________________________________   PROCEDURES  Procedures  ____________________________________________   INITIAL IMPRESSION / ASSESSMENT AND PLAN / ED COURSE  Pertinent labs & imaging results that were available during my care of the patient were reviewed by me and considered in my medical decision making (see chart for details).   Patient presented to the emergency department today because  of concerns for feeling unwell and elevated blood pressure.  Patient does have a history of both hypertension and hyponatremia.  Vital signs and blood work are consistent with that.  She states her sodium levels are usually around 130 although she has been trying to avoid salt in her diet the past couple of days.  No evidence of any end organ damage.  Did discuss continued follow up with PCP.   ____________________________________________   FINAL CLINICAL IMPRESSION(S) / ED DIAGNOSES  Final diagnoses:  Hypertension, unspecified type  Hyponatremia     Note: This dictation was prepared with Dragon dictation. Any transcriptional errors that result from this process are unintentional     Nance Pear, MD 05/06/18 2132

## 2018-05-06 NOTE — ED Triage Notes (Signed)
Pt presents to ED via POV, pt states was seen by Dr. Netty Starring and her BP was 210/80, states was put on Hydralazine in addition to Lisinopril. Pt states was medication regimen was changed to 40mg  Lisinopril and 100mg  Hydralazing in the morning, 50mg  Hydralazine at 1400, 100mg  Hydralazine at night. Pt states side effects were chills, near syncope, and numbness in her arms. Pt states last night took 75mg  Hydralazine and again this morning instead of the 100mg . Pt presents for evaluation of HTN and continued side effects.

## 2018-05-06 NOTE — Telephone Encounter (Signed)
Virtual Visit Pre-Appointment Phone Call  Steps For Call:  1. Confirm consent - "In the setting of the current Covid19 crisis, you are scheduled for a (phone or video) visit with your provider on (date) at (time).  Just as we do with many in-office visits, in order for you to participate in this visit, we must obtain consent.  If you'd like, I can send this to your mychart (if signed up) or email for you to review.  Otherwise, I can obtain your verbal consent now.  All virtual visits are billed to your insurance company just like a normal visit would be.  By agreeing to a virtual visit, we'd like you to understand that the technology does not allow for your provider to perform an examination, and thus may limit your provider's ability to fully assess your condition.  Finally, though the technology is pretty good, we cannot assure that it will always work on either your or our end, and in the setting of a video visit, we may have to convert it to a phone-only visit.  In either situation, we cannot ensure that we have a secure connection.  Are you willing to proceed?"  2. Give patient instructions for WebEx download to smartphone as below if video visit  3. Advise patient to be prepared with any vital sign or heart rhythm information, their current medicines, and a piece of paper and pen handy for any instructions they may receive the day of their visit  4. Inform patient they will receive a phone call 15 minutes prior to their appointment time (may be from unknown caller ID) so they should be prepared to answer  5. Confirm that appointment type is correct in Epic appointment notes (video vs telephone)    TELEPHONE CALL NOTE  DEARA BOBER has been deemed a candidate for a follow-up tele-health visit to limit community exposure during the Covid-19 pandemic. I spoke with the patient via phone to ensure availability of phone/video source, confirm preferred email & phone number, and discuss  instructions and expectations.  I reminded BRITANEE VANBLARCOM to be prepared with any vital sign and/or heart rhythm information that could potentially be obtained via home monitoring, at the time of her visit. I reminded MESSINA KOSINSKI to expect a phone call at the time of her visit if her visit.  Did the patient verbally acknowledge consent to treatment? Yes  Ace Gins 05/06/2018 3:47 PM   DOWNLOADING THE [place], go to [employer] and type in WebEx in the search bar. [place] [employer], the blue/green circle. The app is free but as with any other app downloads, their phone may require them to verify saved payment information or Apple password. The patient does NOT have to create an account.  - If Android, ask patient to go to [employer] and type in WebEx in the search bar. [place] [employer], the blue/green circle. The app is free but as with any other app downloads, their phone may require them to verify saved payment information or Android password. The patient does NOT have to create an account.   CONSENT FOR TELE-HEALTH VISIT - PLEASE REVIEW  I hereby voluntarily request, consent and authorize [facility] and its employed or contracted physicians, physician assistants, nurse practitioners or other licensed health care professionals (the Practitioner), to provide me with telemedicine health care services (the Services") as deemed necessary by the treating Practitioner. I acknowledge  and consent to receive the Services by the Practitioner via telemedicine. I understand that the telemedicine visit will involve communicating with the Practitioner through live audiovisual communication technology and the disclosure of certain medical information by electronic transmission. I acknowledge that I have been given the opportunity to request an in-person assessment or other available alternative prior to the telemedicine visit and am  voluntarily participating in the telemedicine visit.  I understand that I have the right to withhold or withdraw my consent to the use of telemedicine in the course of my care at any time, without affecting my right to future care or treatment, and that the Practitioner or I may terminate the telemedicine visit at any time. I understand that I have the right to inspect all information obtained and/or recorded in the course of the telemedicine visit and may receive copies of available information for a reasonable fee.  I understand that some of the potential risks of receiving the Services via telemedicine include:   Delay or interruption in medical evaluation due to technological equipment failure or disruption;  Information transmitted may not be sufficient (e.g. poor resolution of images) to allow for appropriate medical decision making by the Practitioner; and/or   In rare instances, security protocols could fail, causing a breach of personal health information.  Furthermore, I acknowledge that it is my responsibility to provide information about my medical history, conditions and care that is complete and accurate to the best of my ability. I acknowledge that Practitioner's advice, recommendations, and/or decision may be based on factors not within their control, such as incomplete or inaccurate data provided by me or distortions of diagnostic images or specimens that may result from electronic transmissions. I understand that the practice of medicine is not an exact science and that Practitioner makes no warranties or guarantees regarding treatment outcomes. I acknowledge that I will receive a copy of this consent concurrently upon execution via email to the email address I last provided but may also request a printed copy by calling the office of [facility].    I understand that my insurance will be billed for this visit.   I have read or had this consent read to me.  I understand the  contents of this consent, which adequately explains the benefits and risks of the Services being provided via telemedicine.   I have been provided ample opportunity to ask questions regarding this consent and the Services and have had my questions answered to my satisfaction.  I give my informed consent for the services to be provided through the use of telemedicine in my medical care  By participating in this telemedicine visit I agree to the above.

## 2018-05-06 NOTE — ED Notes (Addendum)
Refer to triage note: Pt denies CP/dizziness/SHOB/N/V. Pt describes feeling "bad" at this time. Pt c/o of "shakiness" on hands and legs.

## 2018-05-06 NOTE — Discharge Instructions (Addendum)
Please seek medical attention for any high fevers, chest pain, shortness of breath, change in behavior, persistent vomiting, bloody stool or any other new or concerning symptoms.  

## 2018-05-06 NOTE — ED Notes (Signed)
ED Provider at bedside. 

## 2018-05-09 ENCOUNTER — Telehealth: Payer: Medicare PPO | Admitting: Cardiovascular Disease

## 2018-06-25 ENCOUNTER — Telehealth: Payer: Self-pay

## 2018-06-25 NOTE — Telephone Encounter (Signed)
Copied from [place] [id number]. Topic: Appointment Scheduling - Scheduling Inquiry for Clinic >> Jun 25, 2018 10:19 AM Yvette Rack wrote: Reason for CRM: Pt would like to schedule a new pt appt with Dr. Olivia Mackie. Attempted to transfer call to the office but there was no answer. Pt requests call back to schedule new pt appt. Cb# [phone]

## 2018-08-08 ENCOUNTER — Other Ambulatory Visit: Payer: Self-pay

## 2018-08-08 ENCOUNTER — Ambulatory Visit (INDEPENDENT_AMBULATORY_CARE_PROVIDER_SITE_OTHER): Payer: Medicare PPO | Admitting: Internal Medicine

## 2018-08-08 ENCOUNTER — Encounter: Payer: Self-pay | Admitting: Internal Medicine

## 2018-08-08 VITALS — BP 140/65 | HR 73

## 2018-08-08 DIAGNOSIS — Z1329 Encounter for screening for other suspected endocrine disorder: Secondary | ICD-10-CM | POA: Diagnosis not present

## 2018-08-08 DIAGNOSIS — E559 Vitamin D deficiency, unspecified: Secondary | ICD-10-CM

## 2018-08-08 DIAGNOSIS — E871 Hypo-osmolality and hyponatremia: Secondary | ICD-10-CM | POA: Insufficient documentation

## 2018-08-08 DIAGNOSIS — I10 Essential (primary) hypertension: Secondary | ICD-10-CM | POA: Diagnosis not present

## 2018-08-08 DIAGNOSIS — K219 Gastro-esophageal reflux disease without esophagitis: Secondary | ICD-10-CM | POA: Insufficient documentation

## 2018-08-08 DIAGNOSIS — M545 Low back pain: Secondary | ICD-10-CM

## 2018-08-08 DIAGNOSIS — G2581 Restless legs syndrome: Secondary | ICD-10-CM | POA: Insufficient documentation

## 2018-08-08 DIAGNOSIS — E785 Hyperlipidemia, unspecified: Secondary | ICD-10-CM

## 2018-08-08 DIAGNOSIS — E611 Iron deficiency: Secondary | ICD-10-CM

## 2018-08-08 DIAGNOSIS — G8929 Other chronic pain: Secondary | ICD-10-CM

## 2018-08-08 DIAGNOSIS — M8580 Other specified disorders of bone density and structure, unspecified site: Secondary | ICD-10-CM | POA: Insufficient documentation

## 2018-08-08 DIAGNOSIS — M1990 Unspecified osteoarthritis, unspecified site: Secondary | ICD-10-CM | POA: Insufficient documentation

## 2018-08-08 DIAGNOSIS — R7303 Prediabetes: Secondary | ICD-10-CM | POA: Insufficient documentation

## 2018-08-08 DIAGNOSIS — K259 Gastric ulcer, unspecified as acute or chronic, without hemorrhage or perforation: Secondary | ICD-10-CM | POA: Insufficient documentation

## 2018-08-08 DIAGNOSIS — B9681 Helicobacter pylori [H. pylori] as the cause of diseases classified elsewhere: Secondary | ICD-10-CM | POA: Insufficient documentation

## 2018-08-08 MED ORDER — CARVEDILOL 3.125 MG PO TABS: 3.1250 mg | ORAL_TABLET | Freq: Two times a day (BID) | ORAL | 3 refills | Status: DC

## 2018-08-08 NOTE — Progress Notes (Addendum)
Telephone Note  I connected with Jill Stanley   on [birth year] at  9:30 AM EDT by telephone and verified that I am speaking with the correct person using two identifiers.  Location patient: home Location provider:work  Persons participating in the virtual visit: patient, provider  I discussed the limitations of evaluation and management by telemedicine and the availability of in person appointments. The patient expressed understanding and agreed to proceed.   HPI: Hyperlipidemia - Could not tolerate statins   Essential hypertension - BP was 210/88 visit 04/30/18 with [facility] PCP she is on lis 40 mg qd hydralazine 100, 50, 100 but having  rash on face with increased dose of hydralazine since dose increased 05/2018 and complexion prior was clear) prev hctz stopped due to low sodium at times BP elevated and she feels pounding in her head   Hyponatremia - chronic per pt has never been worked up   RLS (restless legs syndrome) -sxs worse at night unable to sleep   Gastric ulcer due to Helicobacter pylori, unspecified chronicity/GERD - uncontrolled GERD worse at night  on nexium 20 mg qd and tums does not want to change medication for now  She has tried prilosec as well in the past   Prediabetes A1C 5.7 04/30/18   Arthritis - on mobic 15 mg qd f/u [facility]  Chronic low back pain, unspecified back pain laterality, unspecified whether sciatica present  Osteopenia  Hypercalcemia 10.5 04/30/18      ROS: See pertinent positives and negatives per HPI. General: wt stable  HEENT: no sore throat  CV: no CP Lungs: no sob  GI: +GERD  MSK: chronic low back pain+ Neuro: no h/a or dizziness  GU: no issues  Skin: no issues for now  Psych :no anxiety/depression/memory loss   Past Medical History:  Diagnosis Date  . Arthritis   . Chronic low back pain   . Complication of anesthesia    "All my muscles were sore all over my body was sore the next day.. due to mixture of gases"  . Endometrial  cancer ([place])   . GERD (gastroesophageal reflux disease)   . Heart murmur 2018   tiney murmur, no treatment needed  . Hypertension   . Hyponatremia    chronic   . Osteopenia   . Prediabetes   . RLS (restless legs syndrome)   . Ulcer of the stomach caused by bacteria (H. pylori)    history of    Past Surgical History:  Procedure Laterality Date  . ABDOMINAL HYSTERECTOMY  1992  . BACK SURGERY  1989   L3, 4 and 5  . DILATION AND CURETTAGE OF UTERUS  1992  . KNEE ARTHROPLASTY Left 08/09/2016   Procedure: COMPUTER ASSISTED TOTAL KNEE ARTHROPLASTY;  Surgeon: Dereck Leep, MD;  Location: [facility] ORS;  Service: Orthopedics;  Laterality: Left;  . KNEE ARTHROSCOPY W/ MENISCAL REPAIR Left     Family History  Problem Relation Age of Onset  . Breast cancer Daughter 86  . Aneurysm Grandson        brain age 35 y.o     SOCIAL HX: married lives with husband and 7 y.o grandson taking care of since age 85 when mom died   Current Outpatient Medications:  .  acetaminophen (TYLENOL) 650 MG CR tablet, Take 1,300 mg by mouth every 8 (eight) hours as needed for pain., Disp: , Rfl:  .  Calcium Carbonate Antacid (TUMS PO), Take by mouth., Disp: , Rfl:  .  Calcium Carbonate-Vitamin  D3 (CALCIUM 600-D) 600-400 MG-UNIT TABS, Take 2 tablets by mouth 2 (two) times a day. , Disp: , Rfl:  .  calcium elemental as carbonate (TUMS ULTRA 1000) 400 MG chewable tablet, Chew 1,000 mg by mouth as needed for heartburn., Disp: , Rfl:  .  esomeprazole (NEXIUM) 20 MG capsule, Take 20 mg by mouth daily at 12 noon., Disp: , Rfl:  .  hydrALAZINE (APRESOLINE) 100 MG tablet, Take 100 mg by mouth 2 (two) times daily. 100, 50, 100, Disp: , Rfl:  .  lisinopril (ZESTRIL) 40 MG tablet, Take 40 mg by mouth daily., Disp: , Rfl:  .  loratadine (CLARITIN) 10 MG tablet, Take 10 mg by mouth daily., Disp: , Rfl:  .  meloxicam (MOBIC) 15 MG tablet, Take 15 mg by mouth daily., Disp: , Rfl:  .  methylcellulose oral powder, Take 1 packet by  mouth 2 (two) times daily., Disp: , Rfl:  .  senna-docusate (SENOKOT-S) 8.6-50 MG tablet, Take 2 tablets by mouth at bedtime., Disp: , Rfl:  .  sodium chloride (OCEAN) 0.65 % SOLN nasal spray, Place 2 sprays into both nostrils as needed for congestion., Disp: , Rfl:  .  carvedilol (COREG) 3.125 MG tablet, Take 1 tablet (3.125 mg total) by mouth 2 (two) times daily with a meal., Disp: 60 tablet, Rfl: 3  EXAM:  VITALS per patient if applicable:  GENERAL: alert, oriented, appears well and in no acute distress  PSYCH/NEURO: pleasant and cooperative, no obvious depression or anxiety, speech and thought processing grossly intact  ASSESSMENT AND PLAN:  Discussed the following assessment and plan:  Hyperlipidemia, unspecified hyperlipidemia type - Plan: prev could not tolerate statins  Consider zetia in future   Essential hypertension - BP was 210/88 visit 04/30/18 with [facility] PCP  fasting labs [facility] RAS duplex  Stop hydralazine 100, 50, 10 (she is having rash on face with increased dose of hydralazine since dose increased 05/2018 and complexion prior was clear) and start coreg 3.125 mg bid  Cont lis 40 mg qd  Diuretic hold due to low sodium history  Did not tolerate CCB in the past  BP check by RN today 08/15/18    Hyponatremia - Plan: Microalbumin / creatinine urine ratio, Sodium, urine, random, DG Chest 2 View  RLS (restless legs syndrome) - Plan: Iron, TIBC and Ferritin Panel pts wants to hold meds for now Iron deficiency - Plan: Iron, TIBC and Ferritin Panel  Gastric ulcer due to Helicobacter pylori, unspecified chronicity/GERD - Plan:  rec avoid NSAIDS hold mobic and try Tylenol prn on nexium 20 mg qd and tums does not want to change medication for now  She has tried prilosec as well in the past   Prediabetes - Plan:  Arthritis - Plan: prn tylenol  Hold mobic for now  Chronic low back pain, unspecified back pain laterality, unspecified whether sciatica present  Osteopenia,  unspecified location - Plan on calcium an dvitamin   Hypercalcemia - Plan: check labs and if + w/u further   HM Flu shot utd  Tdap had 10/18/10  Consider shingrix, prevnar, pna 23 if has not had   Osteopenia 12/31/14 dexa Mammogram 12/29/14 neg Colonoscopy no on file consider cologuard in future  Skin consider in future    I discussed the assessment and treatment plan with the patient. The patient was provided an opportunity to ask questions and all were answered. The patient agreed with the plan and demonstrated an understanding of the instructions.   The patient  was advised to call back or seek an in-person evaluation if the symptoms worsen or if the condition fails to improve as anticipated.  Time spent 25 minutes  Delorise Jackson, MD

## 2018-08-09 ENCOUNTER — Telehealth: Payer: Self-pay | Admitting: Internal Medicine

## 2018-08-09 ENCOUNTER — Other Ambulatory Visit: Payer: Self-pay | Admitting: Internal Medicine

## 2018-08-09 DIAGNOSIS — E871 Hypo-osmolality and hyponatremia: Secondary | ICD-10-CM

## 2018-08-09 NOTE — Telephone Encounter (Signed)
Call [facility] to add on plasma osmolality and urine osmolality  Orders in   Thanks [place]

## 2018-08-11 DIAGNOSIS — K219 Gastro-esophageal reflux disease without esophagitis: Secondary | ICD-10-CM | POA: Insufficient documentation

## 2018-08-11 LAB — URINALYSIS, ROUTINE W REFLEX MICROSCOPIC
Bilirubin, UA: NEGATIVE
Glucose, UA: NEGATIVE
Ketones, UA: NEGATIVE
Leukocytes,UA: NEGATIVE
Nitrite, UA: NEGATIVE
Protein,UA: NEGATIVE
RBC, UA: NEGATIVE
Specific Gravity, UA: 1.011 (ref 1.005–1.030)
Urobilinogen, Ur: 0.2 mg/dL (ref 0.2–1.0)
pH, UA: 8 — ABNORMAL HIGH (ref 5.0–7.5)

## 2018-08-11 LAB — LIPID PANEL
Chol/HDL Ratio: 3.2 ratio (ref 0.0–4.4)
Cholesterol, Total: 253 mg/dL — ABNORMAL HIGH (ref 100–199)
HDL: 80 mg/dL (ref >39)
LDL Calculated: 154 mg/dL — ABNORMAL HIGH (ref 0–99)
Triglycerides: 93 mg/dL (ref 0–149)
VLDL Cholesterol Cal: 19 mg/dL (ref 5–40)

## 2018-08-11 LAB — CBC WITH DIFFERENTIAL/PLATELET
Basophils Absolute: 0.1 10*3/uL (ref 0.0–0.2)
Basos: 1 %
EOS (ABSOLUTE): 0.1 10*3/uL (ref 0.0–0.4)
Eos: 1 %
Hematocrit: 40.5 % (ref 34.0–46.6)
Hemoglobin: 13.6 g/dL (ref 11.1–15.9)
Immature Grans (Abs): 0 10*3/uL (ref 0.0–0.1)
Immature Granulocytes: 0 %
Lymphocytes Absolute: 2.3 10*3/uL (ref 0.7–3.1)
Lymphs: 41 %
MCH: 30 pg (ref 26.6–33.0)
MCHC: 33.6 g/dL (ref 31.5–35.7)
MCV: 89 fL (ref 79–97)
Monocytes Absolute: 0.4 10*3/uL (ref 0.1–0.9)
Monocytes: 7 %
Neutrophils Absolute: 2.8 10*3/uL (ref 1.4–7.0)
Neutrophils: 50 %
Platelets: 248 10*3/uL (ref 150–450)
RBC: 4.53 x10E6/uL (ref 3.77–5.28)
RDW: 12.3 % (ref 11.7–15.4)
WBC: 5.5 10*3/uL (ref 3.4–10.8)

## 2018-08-11 LAB — COMPREHENSIVE METABOLIC PANEL
ALT: 14 IU/L (ref 0–32)
AST: 18 IU/L (ref 0–40)
Albumin/Globulin Ratio: 2 (ref 1.2–2.2)
Albumin: 4.3 g/dL (ref 3.6–4.6)
Alkaline Phosphatase: 62 IU/L (ref 39–117)
BUN/Creatinine Ratio: 19 (ref 12–28)
BUN: 14 mg/dL (ref 8–27)
Bilirubin Total: 0.4 mg/dL (ref 0.0–1.2)
CO2: 20 mmol/L (ref 20–29)
Calcium: 9.9 mg/dL (ref 8.7–10.3)
Chloride: 96 mmol/L (ref 96–106)
Creatinine, Ser: 0.73 mg/dL (ref 0.57–1.00)
GFR calc Af Amer: 87 mL/min/{1.73_m2} (ref >59)
GFR calc non Af Amer: 76 mL/min/{1.73_m2} (ref >59)
Globulin, Total: 2.1 g/dL (ref 1.5–4.5)
Glucose: 86 mg/dL (ref 65–99)
Potassium: 4.4 mmol/L (ref 3.5–5.2)
Sodium: 132 mmol/L — ABNORMAL LOW (ref 134–144)
Total Protein: 6.4 g/dL (ref 6.0–8.5)

## 2018-08-11 LAB — IRON,TIBC AND FERRITIN PANEL
Ferritin: 26 ng/mL (ref 15–150)
Iron Saturation: 31 % (ref 15–55)
Iron: 111 ug/dL (ref 27–139)
Total Iron Binding Capacity: 359 ug/dL (ref 250–450)
UIBC: 248 ug/dL (ref 118–369)

## 2018-08-11 LAB — VITAMIN D 25 HYDROXY (VIT D DEFICIENCY, FRACTURES): Vit D, 25-Hydroxy: 26.7 ng/mL — ABNORMAL LOW (ref 30.0–100.0)

## 2018-08-11 LAB — TSH: TSH: 1.77 u[IU]/mL (ref 0.450–4.500)

## 2018-08-11 LAB — MICROALBUMIN / CREATININE URINE RATIO
Creatinine, Urine: 31.2 mg/dL
Microalb/Creat Ratio: 129 mg/g creat — ABNORMAL HIGH (ref 0–29)
Microalbumin, Urine: 40.1 ug/mL

## 2018-08-11 LAB — SODIUM, URINE, RANDOM: Sodium, Ur: 85 mmol/L

## 2018-08-12 NOTE — Telephone Encounter (Signed)
Do they still have the blood?

## 2018-08-13 NOTE — Telephone Encounter (Signed)
Feel free to call & see. This was routed to you. [phone] (acct# [id number])

## 2018-08-13 NOTE — Telephone Encounter (Signed)
[facility] called, test have been added.

## 2018-08-14 ENCOUNTER — Telehealth: Payer: Self-pay

## 2018-08-14 NOTE — Telephone Encounter (Signed)
-----   Message from Delorise Jackson, MD sent at 08/12/2018  6:05 PM EDT ----- Sodium 132 slightly low  Liver enzymes normal  Blood cts normal  Cholesterol elevated  -would she consider non statin medication I.e zetia?  Thyroid labs normal  Urine ok over than protein in urine   Vitamin D low rec D3 2000 IU daily otc if she is taking this amount rec 4000 IU daily  Iron normal

## 2018-08-15 ENCOUNTER — Other Ambulatory Visit: Payer: Self-pay

## 2018-08-15 ENCOUNTER — Ambulatory Visit (INDEPENDENT_AMBULATORY_CARE_PROVIDER_SITE_OTHER): Payer: Medicare PPO

## 2018-08-15 ENCOUNTER — Inpatient Hospital Stay: Admission: RE | Admit: 2018-08-15 | Payer: Medicare PPO | Source: Ambulatory Visit

## 2018-08-15 ENCOUNTER — Other Ambulatory Visit: Payer: Self-pay | Admitting: Internal Medicine

## 2018-08-15 ENCOUNTER — Other Ambulatory Visit: Payer: Medicare PPO

## 2018-08-15 VITALS — BP 180/70 | HR 57

## 2018-08-15 DIAGNOSIS — I10 Essential (primary) hypertension: Secondary | ICD-10-CM

## 2018-08-15 DIAGNOSIS — E871 Hypo-osmolality and hyponatremia: Secondary | ICD-10-CM | POA: Diagnosis not present

## 2018-08-15 MED ORDER — EZETIMIBE 10 MG PO TABS: 10.0000 mg | ORAL_TABLET | Freq: Every day | ORAL | 3 refills | Status: DC

## 2018-08-15 MED ORDER — CLONIDINE 0.1 MG/24HR TD PTWK: 0.1000 mg | MEDICATED_PATCH | TRANSDERMAL | 12 refills | Status: DC

## 2018-08-15 NOTE — Progress Notes (Signed)
HTN on lis 40 mg qd and coreg 3.125 mg bid   Add clonidine 0.1 patch to change 1 x per week  Added zetia 10 mg qd   Pending RAS [place] CXR done today to w/u low sodium   Log of blood pressures reviewed today since 08/09/18 to 08/15/18 BP 137-176/70s-80s and HR 55-60s  bP recheck in 1 week and compare to home bp cuff    [place]

## 2018-08-15 NOTE — Progress Notes (Signed)
Patient here for nurse visit BP check per MD order (see OV note on 08/08/18).  Patient reports compliance with prescribed BP medications: YES  Last dose of BP medication: Today  BP Readings from Last 3 Encounters:  08/15/18 (!) 180/70  08/08/18 140/65  05/06/18 (!) 177/78   Pulse Readings from Last 3 Encounters:  08/15/18 (!) 57  08/08/18 73  05/06/18 69   Readings given to Dr. Aundra Dubin for review while patient was in office.  Dr. Aundra Dubin spoke to patient in regards to medication changes.   Per Dr. Aundra Dubin schedule pt a nurse visit in 1 week to have bp rechecked.  Pt scheduled a nurse visit on 08/22/18 @ 4:00 pm.  Patient verbalized understanding of instructions.   Dorise Bullion, CMA

## 2018-08-15 NOTE — Progress Notes (Signed)
I want to start her on clonidine patch change 1x per week   O.1 mg   TMS

## 2018-08-20 LAB — SPECIMEN STATUS REPORT

## 2018-08-21 ENCOUNTER — Other Ambulatory Visit: Payer: Medicare PPO

## 2018-08-21 ENCOUNTER — Other Ambulatory Visit: Payer: Self-pay

## 2018-08-21 ENCOUNTER — Ambulatory Visit
Admission: RE | Admit: 2018-08-21 | Discharge: 2018-08-21 | Disposition: A | Discharge status: Home / Self Care | Payer: Medicare PPO | Source: Ambulatory Visit | Attending: Internal Medicine | Admitting: Internal Medicine

## 2018-08-21 DIAGNOSIS — I10 Essential (primary) hypertension: Secondary | ICD-10-CM | POA: Insufficient documentation

## 2018-08-22 ENCOUNTER — Other Ambulatory Visit: Payer: Self-pay | Admitting: Internal Medicine

## 2018-08-22 ENCOUNTER — Other Ambulatory Visit: Payer: Self-pay

## 2018-08-22 ENCOUNTER — Ambulatory Visit (INDEPENDENT_AMBULATORY_CARE_PROVIDER_SITE_OTHER): Payer: Medicare PPO

## 2018-08-22 ENCOUNTER — Encounter: Payer: Self-pay | Admitting: Internal Medicine

## 2018-08-22 VITALS — BP 200/88 | HR 55

## 2018-08-22 DIAGNOSIS — I10 Essential (primary) hypertension: Secondary | ICD-10-CM

## 2018-08-22 MED ORDER — TELMISARTAN 40 MG PO TABS: 40.0000 mg | ORAL_TABLET | Freq: Every day | ORAL | 0 refills | Status: DC

## 2018-08-22 NOTE — Progress Notes (Signed)
BP today 200/90 408 and repeat 200/88 home machine 208/101 w/o sx's and reports left sided dull h/a not daily but at times but not present today, no CP, no sob  On coreg bid, lisinopril 40 mg qd, clonidine 0.1 patch 1x/week BP readings at home 126-170s/64-98 avg being 130s-140s/70s-80s and HR 52-63    A/P  Will d/c lisinopril 40 mg today and change to micardis 40 mg qd  Cont other meds  BP recheck in 1 week if still elevated consider increase micardis to 80 mg qd  If BP still elevated consider renal/cardiology referral to help   Would not rec diuretic due to hyponatremia history, BB due to bradycardia, could not tolerate hydralazine or Norvasc in the past   [place]

## 2018-08-22 NOTE — Progress Notes (Signed)
Patient here for nurse visit BP check per MD order from CS note on 08/15/18.   Patient reports compliance with prescribed BP medications: YES  Last dose of BP meds:  Today at 2:00 pm  Pt said that sometimes she gets a dull headache on the top of her head over the left-side when she changes positions.  Pt said that this happens occasionally.  Pt denied having any other cardiac symptoms.  BP reading for today was 200/90 and 200/88.  Patient brought in her blood pressure machine from home to do a check.  BP machine reading was 208/101.  Patient also brought in her bp reading log.  Log given to Dr. Aundra Dubin for review.  BP readings for today given to Dr. Aundra Dubin for review while patient was in office.  Dr. Aundra Dubin spoke with patient in regards to medication changes.  BP Readings from Last 3 Encounters:  08/22/18 (!) 200/88  08/15/18 (!) 180/70  08/08/18 140/65   Pulse Readings from Last 3 Encounters:  08/22/18 (!) 55  08/15/18 (!) 57  08/08/18 73    Dorise Bullion, CMA

## 2018-08-23 LAB — OSMOLALITY, URINE: Osmolality, Ur: 329 mOsmol/kg

## 2018-08-23 LAB — SPECIMEN STATUS REPORT

## 2018-08-23 LAB — OSMOLALITY

## 2018-08-30 ENCOUNTER — Ambulatory Visit (INDEPENDENT_AMBULATORY_CARE_PROVIDER_SITE_OTHER): Payer: Medicare PPO

## 2018-08-30 ENCOUNTER — Other Ambulatory Visit: Payer: Self-pay | Admitting: Internal Medicine

## 2018-08-30 ENCOUNTER — Other Ambulatory Visit: Payer: Self-pay

## 2018-08-30 DIAGNOSIS — I10 Essential (primary) hypertension: Secondary | ICD-10-CM

## 2018-08-30 NOTE — Progress Notes (Signed)
Reviewed BP readings 08/15/18 to 08/30/18  BP today 210/88 repeat 188/8 8 Home readnigs 08/15/18 with clonidine patch coreg 141/79 HR 56, 140/70 HR 61   With the start of telmisartan 40 mg in place of lisinopril 40 mg qd her blood pressure trended up to 140s-180s/70s-80s readings where prior to this had been 130s-150s/70s-90s   Not sure the reason for this but will hold telmisartan 40 for now, continue clonidine patch, coreg and add back lisinopril 40 mg qd and pt to let me know in 1 week what her BP is doing    Will refer to Dr. Rockey Situ and Dr. Holley Raring to weigh in on BP control in this patient  Renal US negative ? White coat HTN vs other  Caution with using fluid pill with h/o hyponatremia and had reaction/intolerance to hydralazine and CCB   TMS

## 2018-08-30 NOTE — Progress Notes (Signed)
Patient is here for a BP check due to bp being high at last visit, as per patient.  Currently patients BP is 188/88 and BPM is 65.  Patient has no complaints of headaches, blurry vision, chest pain, arm pain, light headedness, dizziness, and nor jaw pain

## 2018-08-30 NOTE — Progress Notes (Signed)
Please take Coreg 3.125 mg 2x per day  Clonidine 0.1 patch change weekly  Resume Lisinopril 40 mg daily   Stop Micardis 40 mg daily  Call back in 1 week please   Referred to Dr. Holley Raring kidney doctor  Dr. Rockey Situ cardiology

## 2018-09-02 ENCOUNTER — Ambulatory Visit (INDEPENDENT_AMBULATORY_CARE_PROVIDER_SITE_OTHER): Payer: Medicare PPO | Admitting: Cardiovascular Disease

## 2018-09-02 ENCOUNTER — Encounter: Payer: Self-pay | Admitting: Cardiovascular Disease

## 2018-09-02 ENCOUNTER — Other Ambulatory Visit: Payer: Self-pay

## 2018-09-02 VITALS — BP 180/80 | HR 64 | Temp 98.0°F | Ht 59.0 in | Wt 162.5 lb

## 2018-09-02 DIAGNOSIS — I10 Essential (primary) hypertension: Secondary | ICD-10-CM | POA: Diagnosis not present

## 2018-09-02 DIAGNOSIS — E785 Hyperlipidemia, unspecified: Secondary | ICD-10-CM | POA: Diagnosis not present

## 2018-09-02 MED ORDER — SPIRONOLACTONE 25 MG PO TABS: 25.0000 mg | ORAL_TABLET | Freq: Every day | ORAL | 3 refills | Status: DC

## 2018-09-02 NOTE — Patient Instructions (Addendum)
Medication Instructions:  Your physician has recommended you make the following change in your medication:  1- START Spironolactone 25 mg by mouth once a day.  If you need a refill on your cardiac medications before your next appointment, please call your pharmacy.   Lab work: Your physician recommends that you return for lab work in: 1 week on September 09, 2018 at the [facility]. (bmet) - Please go to the [facility]. You will check in at the front desk to the right as you walk into the atrium. Valet Parking is offered if needed.    If you have labs (blood work) drawn today and your tests are completely normal, you will receive your results only by: Marland Kitchen MyChart Message (if you have MyChart) OR . A paper copy in the mail If you have any lab test that is abnormal or we need to change your treatment, we will call you to review the results.  Testing/Procedures: - None ordered.   Follow-Up: At [facility], you and your health needs are our priority.  As part of our continuing mission to provide you with exceptional heart care, we have created designated Provider Care Teams.  These Care Teams include your primary Cardiologist (physician) and Advanced Practice Providers (APPs -  Physician Assistants and Nurse Practitioners) who all work together to provide you with the care you need, when you need it. You will need a follow up appointment in 3 months.  Please call our office 2 months in advance to schedule this appointment.  You may see DR Kathlyn Sacramento or one of the following Advanced Practice Providers on your designated Care Team:   Murray Hodgkins, NP Christell Faith, PA-C . Marrianne Mood, PA-C

## 2018-09-02 NOTE — Progress Notes (Signed)
Cardiology Office Note   Date:  08/30/2749   ID:  Chinyere, Galiano [birth year], MRN [id number]  PCP:  McLean-Scocuzza, Nino Glow, MD  Cardiologist:   Kathlyn Sacramento, MD   Chief Complaint  Patient presents with  . other    Ref by Dr. Terese Door for uncontrolled HTN. Meds reviewed by the pt. verbally. Pt. c/o shortness of breath with over exertion.       History of Present Illness: Jill Stanley is a 85 y.o. female who was referred by Dr. Terese Door for evaluation of refractory hypertension. She has known history of hyperlipidemia with intolerance to statins, essential hypertension with difficult to control blood pressure, hyponatremia, restless leg syndrome, gastric ulcers and arthritis.  Renal artery duplex last month showed no evidence of renal artery stenosis. She reports being on lisinopril-hydrochlorothiazide for many years which was subsequently discontinued due to worsening hyponatremia.  She was placed on hydralazine but had side effects.  Amlodipine caused leg edema and was discontinued.  She was ultimately started on clonidine patch and small dose carvedilol.  Her blood pressure has improved since then although it still not optimal.  Interestingly, her blood pressure readings at home are much better than at the physician's office.  Her most recent blood pressure readings at home are 140/77, 134/68 and 148/82. She denies any chest pain or shortness of breath.  She has no history of sleep apnea.  She does use meloxicam for arthritis but no other NSAIDs.  She has family history of hypertension but no coronary artery disease.  She is not a smoker and drinks occasional wine.   Past Medical History:  Diagnosis Date  . Arthritis   . Chronic low back pain   . Complication of anesthesia    "All my muscles were sore all over my body was sore the next day.. due to mixture of gases"  . Endometrial cancer ([place])   . GERD (gastroesophageal reflux disease)   . Heart murmur  2018   tiney murmur, no treatment needed  . Hypertension   . Hyponatremia    chronic   . Osteopenia   . Prediabetes   . RLS (restless legs syndrome)   . Ulcer of the stomach caused by bacteria (H. pylori)    history of    Past Surgical History:  Procedure Laterality Date  . ABDOMINAL HYSTERECTOMY  1992  . BACK SURGERY  1989   L3, 4 and 5  . DILATION AND CURETTAGE OF UTERUS  1992  . KNEE ARTHROPLASTY Left 08/09/2016   Procedure: COMPUTER ASSISTED TOTAL KNEE ARTHROPLASTY;  Surgeon: Dereck Leep, MD;  Location: [facility] ORS;  Service: Orthopedics;  Laterality: Left;  . KNEE ARTHROSCOPY W/ MENISCAL REPAIR Left      Current Outpatient Medications  Medication Sig Dispense Refill  . acetaminophen (TYLENOL) 650 MG CR tablet Take 1,300 mg by mouth every 8 (eight) hours as needed for pain.    . Calcium Carbonate Antacid (TUMS PO) Take by mouth.    . Calcium Carbonate-Vitamin D3 (CALCIUM 600-D) 600-400 MG-UNIT TABS Take 2 tablets by mouth 2 (two) times a day.     . calcium elemental as carbonate (TUMS ULTRA 1000) 400 MG chewable tablet Chew 1,000 mg by mouth as needed for heartburn.    . carvedilol (COREG) 3.125 MG tablet Take 1 tablet (3.125 mg total) by mouth 2 (two) times daily with a meal. 60 tablet 3  . cloNIDine (CATAPRES - DOSED IN MG/24 HR) 0.1 mg/24hr patch Place  1 patch (0.1 mg total) onto the skin once a week. 4 patch 12  . esomeprazole (NEXIUM) 20 MG capsule Take 20 mg by mouth daily at 12 noon.    . ezetimibe (ZETIA) 10 MG tablet Take 1 tablet (10 mg total) by mouth daily. 90 tablet 3  . lisinopril (ZESTRIL) 40 MG tablet Take 40 mg by mouth daily.    . meloxicam (MOBIC) 15 MG tablet Take 15 mg by mouth daily.    . methylcellulose oral powder Take 1 packet by mouth 2 (two) times daily.    . sodium chloride (OCEAN) 0.65 % SOLN nasal spray Place 2 sprays into both nostrils as needed for congestion.     No current facility-administered medications for this visit.     Allergies:    Amlodipine, Hydralazine hcl, Norvasc [amlodipine besylate], Epinephrine, Ibuprofen, Lovastatin, Morphine, Tramadol, and Zocor [simvastatin]    Social History:  The patient  reports that she has never smoked. She has never used smokeless tobacco. She reports current alcohol use. She reports that she does not use drugs.   Family History:  The patient's family history includes Alzheimer's disease in her father; Aneurysm in her grandson; Breast cancer (age of onset: 76) in her daughter.    ROS:  Please see the history of present illness.   Otherwise, review of systems are positive for none.   All other systems are reviewed and negative.    PHYSICAL EXAM: VS:  BP (!) 180/80 (BP Location: Right Arm, Patient Position: Sitting, Cuff Size: Normal)   Pulse 64   Temp 98 F (36.7 C)   Ht 4\' 11"  (1.499 m)   Wt 162 lb 8 oz (73.7 kg)   SpO2 99%   BMI 32.82 kg/m  , BMI Body mass index is 32.82 kg/m. GEN: Well nourished, well developed, in no acute distress  HEENT: normal  Neck: no JVD, carotid bruits, or masses Cardiac: RRR; no murmurs, rubs, or gallops,no edema  Respiratory:  clear to auscultation bilaterally, normal work of breathing GI: soft, nontender, nondistended, + BS MS: no deformity or atrophy  Skin: warm and dry, no rash Neuro:  Strength and sensation are intact Psych: euthymic mood, full affect   EKG:  EKG is ordered today. The ekg ordered today demonstrates normal sinus rhythm with PACs and no significant ST or T wave changes.   Recent Labs: 08/09/2018: ALT 14; BUN 14; Creatinine, Ser 0.73; Hemoglobin 13.6; Platelets 248; Potassium 4.4; Sodium 132; TSH 1.770    Lipid Panel    Component Value Date/Time   CHOL 253 (H) 08/09/2018 0854   TRIG 93 08/09/2018 0854   HDL 80 08/09/2018 0854   CHOLHDL 3.2 08/09/2018 0854   LDLCALC 154 (H) 08/09/2018 0854      Wt Readings from Last 3 Encounters:  09/02/18 162 lb 8 oz (73.7 kg)  05/06/18 160 lb (72.6 kg)  08/09/16 160 lb (72.6  kg)        PAD Screen 09/02/2018  Previous PAD dx? No  Previous surgical procedure? No  Pain with walking? No  Feet/toe relief with dangling? No  Painful, non-healing ulcers? No  Extremities discolored? No      ASSESSMENT AND PLAN:  1.  Refractory hypertension: No evidence of renal artery stenosis.  There seems to be a component of whitecoat syndrome given discrepancy between home blood pressure readings and office readings.  Her blood pressure has improved on current regimen of clonidine patch, small dose carvedilol and lisinopril.  Nonetheless, blood pressure  is still not optimal.  I do not see evidence of secondary hypertension.  I think the best option is to add spironolactone 25 mg once daily.  Check basic metabolic profile in 1 week.  Most cases of refractory hypertension tend to respond to aldosterone blocking agents.  Ultimately, the goal is to try to get her off clonidine given that it is not an optimal medication in the elderly.  I do not think we will be able to increase carvedilol any further due to relatively low pulse rate.  We might be able to put her on small dose amlodipine given that she is not truly allergic to but rather had swelling.  2.  Hyperlipidemia: Intolerance to statins.  Currently on Zetia.   Disposition:   FU with me in 3 months  Signed,  Kathlyn Sacramento, MD  09/02/2018 2:13 PM    [place]

## 2018-09-06 ENCOUNTER — Telehealth: Payer: Self-pay | Admitting: Internal Medicine

## 2018-09-06 NOTE — Telephone Encounter (Signed)
Pt was report bp readings to Dr Linus Orn  Yesterday bp 10:25 am  134/78  p 58 9:15 pm 144/73  p 58  Today 10:25 am  153/81  p 53 1 pm    147/79  p51

## 2018-09-06 NOTE — Telephone Encounter (Signed)
This is much better on spironolactone  Will she get blood with the heart doctors office or our office? To check sodium   TMS

## 2018-09-06 NOTE — Telephone Encounter (Signed)
Pt said that she has lab appointment at the [facility] on Monday and her cardiologist has ordered a BMP to check sodium and potassium levels.

## 2018-09-09 ENCOUNTER — Other Ambulatory Visit
Admission: RE | Admit: 2018-09-09 | Discharge: 2018-09-09 | Disposition: A | Discharge status: Home / Self Care | Payer: Medicare PPO | Source: Ambulatory Visit | Attending: Cardiovascular Disease | Admitting: Cardiovascular Disease

## 2018-09-09 ENCOUNTER — Other Ambulatory Visit: Payer: Self-pay

## 2018-09-09 DIAGNOSIS — I10 Essential (primary) hypertension: Secondary | ICD-10-CM

## 2018-09-09 LAB — BASIC METABOLIC PANEL
Anion gap: 8 (ref 5–15)
BUN: 19 mg/dL (ref 8–23)
CO2: 25 mmol/L (ref 22–32)
Calcium: 9.2 mg/dL (ref 8.9–10.3)
Chloride: 98 mmol/L (ref 98–111)
Creatinine, Ser: 0.72 mg/dL (ref 0.44–1.00)
GFR calc Af Amer: >60 mL/min (ref >60)
GFR calc non Af Amer: >60 mL/min (ref >60)
Glucose, Bld: 88 mg/dL (ref 70–99)
Potassium: 4.6 mmol/L (ref 3.5–5.1)
Sodium: 131 mmol/L — ABNORMAL LOW (ref 135–145)

## 2018-09-11 ENCOUNTER — Other Ambulatory Visit: Payer: Self-pay

## 2018-09-12 ENCOUNTER — Other Ambulatory Visit: Payer: Self-pay

## 2018-09-12 ENCOUNTER — Encounter: Payer: Self-pay | Admitting: Internal Medicine

## 2018-09-12 ENCOUNTER — Ambulatory Visit (INDEPENDENT_AMBULATORY_CARE_PROVIDER_SITE_OTHER): Payer: Medicare PPO | Admitting: Internal Medicine

## 2018-09-12 VITALS — BP 130/80 | HR 65 | Temp 97.1°F | Ht 59.0 in | Wt 162.6 lb

## 2018-09-12 DIAGNOSIS — E871 Hypo-osmolality and hyponatremia: Secondary | ICD-10-CM | POA: Diagnosis not present

## 2018-09-12 DIAGNOSIS — E785 Hyperlipidemia, unspecified: Secondary | ICD-10-CM

## 2018-09-12 DIAGNOSIS — I10 Essential (primary) hypertension: Secondary | ICD-10-CM

## 2018-09-12 DIAGNOSIS — Z1231 Encounter for screening mammogram for malignant neoplasm of breast: Secondary | ICD-10-CM

## 2018-09-12 DIAGNOSIS — Z23 Encounter for immunization: Secondary | ICD-10-CM | POA: Diagnosis not present

## 2018-09-12 MED ORDER — LISINOPRIL 40 MG PO TABS: 40.0000 mg | ORAL_TABLET | Freq: Every day | ORAL | 3 refills | Status: DC

## 2018-09-12 NOTE — Addendum Note (Signed)
Addended by: Fulton Mole D on: 09/12/2018 04:18 PM   Modules accepted: Orders

## 2018-09-12 NOTE — Patient Instructions (Addendum)
Cancel Dr. Wendall Stade (renal/kidney appt) [phone] Results for Jill Stanley, Jill Stanley (MRN [id number]) as of 09/12/2018 14:44  Ref. Range 09/09/2018 09:35  Sodium Latest Ref Range: 135 - 145 mmol/L 131 (L)  Potassium Latest Ref Range: 3.5 - 5.1 mmol/L 4.6  Chloride Latest Ref Range: 98 - 111 mmol/L 98  CO2 Latest Ref Range: 22 - 32 mmol/L 25  Glucose Latest Ref Range: 70 - 99 mg/dL 88  BUN Latest Ref Range: 8 - 23 mg/dL 19  Creatinine Latest Ref Range: 0.44 - 1.00 mg/dL 0.72  Calcium Latest Ref Range: 8.9 - 10.3 mg/dL 9.2  Anion gap Latest Ref Range: 5 - 15  8  GFR, Est Non African American Latest Ref Range: >60 mL/min >60  GFR, Est African American Latest Ref Range: >60 mL/min >60   Pneumococcal Conjugate Vaccine suspension for injection What is this medicine? PNEUMOCOCCAL VACCINE (NEU mo KOK al vak SEEN) is a vaccine used to prevent pneumococcus bacterial infections. These bacteria can cause serious infections like pneumonia, meningitis, and blood infections. This vaccine will lower your chance of getting pneumonia. If you do get pneumonia, it can make your symptoms milder and your illness shorter. This vaccine will not treat an infection and will not cause infection. This vaccine is recommended for infants and young children, adults with certain medical conditions, and adults 67 years or older. This medicine may be used for other purposes; ask your health care provider or pharmacist if you have questions. COMMON BRAND NAME(S): Prevnar, Prevnar 13 What should I tell my health care provider before I take this medicine? They need to know if you have any of these conditions:  bleeding problems  fever  immune system problems  an unusual or allergic reaction to pneumococcal vaccine, diphtheria toxoid, other vaccines, latex, other medicines, foods, dyes, or preservatives  pregnant or trying to get pregnant  breast-feeding How should I use this medicine? This vaccine is for injection into a  muscle. It is given by a health care professional. A copy of Vaccine Information Statements will be given before each vaccination. Read this sheet carefully each time. The sheet may change frequently. Talk to your pediatrician regarding the use of this medicine in children. While this drug may be prescribed for children as young as 31 weeks old for selected conditions, precautions do apply. Overdosage: If you think you have taken too much of this medicine contact a poison control center or emergency room at once. NOTE: This medicine is only for you. Do not share this medicine with others. What if I miss a dose? It is important not to miss your dose. Call your doctor or health care professional if you are unable to keep an appointment. What may interact with this medicine?  medicines for cancer chemotherapy  medicines that suppress your immune function  steroid medicines like prednisone or cortisone This list may not describe all possible interactions. Give your health care provider a list of all the medicines, herbs, non-prescription drugs, or dietary supplements you use. Also tell them if you smoke, drink alcohol, or use illegal drugs. Some items may interact with your medicine. What should I watch for while using this medicine? Mild fever and pain should go away in 3 days or less. Report any unusual symptoms to your doctor or health care professional. What side effects may I notice from receiving this medicine? Side effects that you should report to your doctor or health care professional as soon as possible:  allergic reactions like  skin rash, itching or hives, swelling of the face, lips, or tongue  breathing problems  confused  fast or irregular heartbeat  fever over 102 degrees F  seizures  unusual bleeding or bruising  unusual muscle weakness Side effects that usually do not require medical attention (report to your doctor or health care professional if they continue or are  bothersome):  aches and pains  diarrhea  fever of 102 degrees F or less  headache  irritable  loss of appetite  pain, tender at site where injected  trouble sleeping This list may not describe all possible side effects. Call your doctor for medical advice about side effects. You may report side effects to FDA at 1-800-FDA-1088. Where should I keep my medicine? This does not apply. This vaccine is given in a clinic, pharmacy, doctor's office, or other health care setting and will not be stored at home. NOTE: This sheet is a summary. It may not cover all possible information. If you have questions about this medicine, talk to your doctor, pharmacist, or health care provider.  2020 [employer]vier/Gold Standard (2013-10-23 10:27:27)

## 2018-09-12 NOTE — Progress Notes (Signed)
Chief Complaint  Patient presents with  . Follow-up    BP   F/u  1. HTN BP improved spironolactone 25 mg qd, lis 40 mg qd, clonidine 0.1 mg qd, coreg 3.125 mg bid BP improved logging BP and 120s-150s/70s-80s    Review of Systems  Constitutional: Negative for weight loss.  HENT: Negative for hearing loss.   Eyes: Negative for blurred vision.  Respiratory: Negative for shortness of breath.   Cardiovascular: Negative for chest pain.  Gastrointestinal: Negative for abdominal pain.  Musculoskeletal: Negative for falls.  Skin: Negative for rash.  Neurological: Negative for headaches.  Psychiatric/Behavioral: Negative for depression and memory loss.   Past Medical History:  Diagnosis Date  . Arthritis   . Chronic low back pain   . Complication of anesthesia    "All my muscles were sore all over my body was sore the next day.. due to mixture of gases"  . Endometrial cancer ([place])   . GERD (gastroesophageal reflux disease)   . Heart murmur 2018   tiney murmur, no treatment needed  . Hypertension   . Hyponatremia    chronic   . Osteopenia   . Prediabetes   . RLS (restless legs syndrome)   . Ulcer of the stomach caused by bacteria (H. pylori)    history of   Past Surgical History:  Procedure Laterality Date  . ABDOMINAL HYSTERECTOMY  1992  . BACK SURGERY  1989   L3, 4 and 5  . DILATION AND CURETTAGE OF UTERUS  1992  . KNEE ARTHROPLASTY Left 08/09/2016   Procedure: COMPUTER ASSISTED TOTAL KNEE ARTHROPLASTY;  Surgeon: Dereck Leep, MD;  Location: [facility] ORS;  Service: Orthopedics;  Laterality: Left;  . KNEE ARTHROSCOPY W/ MENISCAL REPAIR Left    Family History  Problem Relation Age of Onset  . Breast cancer Daughter 23  . Aneurysm Grandson        brain age 51 y.o   . Alzheimer's disease Father    Social History   Socioeconomic History  . Marital status: Married    Spouse name: Not on file  . Number of children: Not on file  . Years of education: Not on file  .  Highest education level: Not on file  Occupational History  . Not on file  Social Needs  . Financial resource strain: Not on file  . Food insecurity    Worry: Not on file    Inability: Not on file  . Transportation needs    Medical: Not on file    Non-medical: Not on file  Tobacco Use  . Smoking status: Never Smoker  . Smokeless tobacco: Never Used  Substance and Sexual Activity  . Alcohol use: Yes    Comment: a glass of wine 2-3 times a year  . Drug use: No  . Sexual activity: Not Currently  Lifestyle  . Physical activity    Days per week: Not on file    Minutes per session: Not on file  . Stress: Not on file  Relationships  . Social Herbalist on phone: Not on file    Gets together: Not on file    Attends religious service: Not on file    Active member of club or organization: Not on file    Attends meetings of clubs or organizations: Not on file    Relationship status: Not on file  . Intimate partner violence    Fear of current or ex partner: Not on file  Emotionally abused: Not on file    Physically abused: Not on file    Forced sexual activity: Not on file  Other Topics Concern  . Not on file  Social History Narrative   Married lives with husband Abigial Newville who is DPR    Current Meds  Medication Sig  . acetaminophen (TYLENOL) 650 MG CR tablet Take 1,300 mg by mouth every 8 (eight) hours as needed for pain.  . Calcium Carbonate Antacid (TUMS PO) Take by mouth.  . Calcium Carbonate-Vitamin D3 (CALCIUM 600-D) 600-400 MG-UNIT TABS Take 2 tablets by mouth 2 (two) times a day.   . calcium elemental as carbonate (TUMS ULTRA 1000) 400 MG chewable tablet Chew 1,000 mg by mouth as needed for heartburn.  . carvedilol (COREG) 3.125 MG tablet Take 1 tablet (3.125 mg total) by mouth 2 (two) times daily with a meal.  . cloNIDine (CATAPRES - DOSED IN MG/24 HR) 0.1 mg/24hr patch Place 1 patch (0.1 mg total) onto the skin once a week.  . esomeprazole (NEXIUM) 20  MG capsule Take 20 mg by mouth daily at 12 noon.  . ezetimibe (ZETIA) 10 MG tablet Take 1 tablet (10 mg total) by mouth daily.  Marland Kitchen lisinopril (ZESTRIL) 40 MG tablet Take 1 tablet (40 mg total) by mouth daily.  . meloxicam (MOBIC) 15 MG tablet Take 15 mg by mouth daily.  . methylcellulose oral powder Take 1 packet by mouth 2 (two) times daily.  . sodium chloride (OCEAN) 0.65 % SOLN nasal spray Place 2 sprays into both nostrils as needed for congestion.  Marland Kitchen spironolactone (ALDACTONE) 25 MG tablet Take 1 tablet (25 mg total) by mouth daily.  . [DISCONTINUED] lisinopril (ZESTRIL) 40 MG tablet Take 40 mg by mouth daily.   Allergies  Allergen Reactions  . Amlodipine Swelling  . Hydralazine Hcl     Rash   . Norvasc [Amlodipine Besylate]     5 mg leg swelling    . Epinephrine Anxiety    Hyperactivity  . Ibuprofen Other (See Comments)    Cannot take due to GI ulcer  . Lovastatin Diarrhea, Nausea Only and Nausea And Vomiting  . Morphine Nausea Only  . Tramadol Nausea Only    Caused nausea, "felt awful all over", and med did not help with pain "Felt awful all over", and med did not help with pain  . Zocor [Simvastatin] Diarrhea, Nausea And Vomiting and Nausea Only    Nausea and vomiting     Recent Results (from the past 2160 hour(s))  Comprehensive metabolic panel     Status: Abnormal   Collection Time: 08/09/18  8:54 AM  Result Value Ref Range   Glucose 86 65 - 99 mg/dL   BUN 14 8 - 27 mg/dL   Creatinine, Ser 0.73 0.57 - 1.00 mg/dL   GFR calc non Af Amer 76 >59 mL/min/1.73   GFR calc Af Amer 87 >59 mL/min/1.73   BUN/Creatinine Ratio 19 12 - 28   Sodium 132 (L) 134 - 144 mmol/L   Potassium 4.4 3.5 - 5.2 mmol/L   Chloride 96 96 - 106 mmol/L   CO2 20 20 - 29 mmol/L   Calcium 9.9 8.7 - 10.3 mg/dL   Total Protein 6.4 6.0 - 8.5 g/dL   Albumin 4.3 3.6 - 4.6 g/dL   Globulin, Total 2.1 1.5 - 4.5 g/dL   Albumin/Globulin Ratio 2.0 1.2 - 2.2   Bilirubin Total 0.4 0.0 - 1.2 mg/dL   Alkaline  Phosphatase 62 39 -  117 IU/L   AST 18 0 - 40 IU/L   ALT 14 0 - 32 IU/L  CBC w/Diff     Status: None   Collection Time: 08/09/18  8:54 AM  Result Value Ref Range   WBC 5.5 3.4 - 10.8 x10E3/uL   RBC 4.53 3.77 - 5.28 x10E6/uL   Hemoglobin 13.6 11.1 - 15.9 g/dL   Hematocrit 40.5 34.0 - 46.6 %   MCV 89 79 - 97 fL   MCH 30.0 26.6 - 33.0 pg   MCHC 33.6 31.5 - 35.7 g/dL   RDW 12.3 11.7 - 15.4 %   Platelets 248 150 - 450 x10E3/uL   Neutrophils 50 Not Estab. %   Lymphs 41 Not Estab. %   Monocytes 7 Not Estab. %   Eos 1 Not Estab. %   Basos 1 Not Estab. %   Neutrophils Absolute 2.8 1.4 - 7.0 x10E3/uL   Lymphocytes Absolute 2.3 0.7 - 3.1 x10E3/uL   Monocytes Absolute 0.4 0.1 - 0.9 x10E3/uL   EOS (ABSOLUTE) 0.1 0.0 - 0.4 x10E3/uL   Basophils Absolute 0.1 0.0 - 0.2 x10E3/uL   Immature Granulocytes 0 Not Estab. %   Immature Grans (Abs) 0.0 0.0 - 0.1 x10E3/uL  Lipid panel     Status: Abnormal   Collection Time: 08/09/18  8:54 AM  Result Value Ref Range   Cholesterol, Total 253 (H) 100 - 199 mg/dL   Triglycerides 93 0 - 149 mg/dL   HDL 80 >39 mg/dL   VLDL Cholesterol Cal 19 5 - 40 mg/dL   LDL Calculated 154 (H) 0 - 99 mg/dL   Chol/HDL Ratio 3.2 0.0 - 4.4 ratio    Comment:                                   T. Chol/HDL Ratio                                             Men  Women                               1/2 Avg.Risk  3.4    3.3                                   Avg.Risk  5.0    4.4                                2X Avg.Risk  9.6    7.1                                3X Avg.Risk 23.4   11.0   TSH     Status: None   Collection Time: 08/09/18  8:54 AM  Result Value Ref Range   TSH 1.770 0.450 - 4.500 uIU/mL  Urinalysis, Routine w reflex microscopic     Status: Abnormal   Collection Time: 08/09/18  8:54 AM  Result Value Ref Range   Specific Gravity, UA 1.011 1.005 - 1.030   pH, UA 8.0 (H) 5.0 -  7.5   Color, UA Yellow Yellow   Appearance Ur Clear Clear   Leukocytes,UA Negative  Negative   Protein,UA Negative Negative/Trace   Glucose, UA Negative Negative   Ketones, UA Negative Negative   RBC, UA Negative Negative   Bilirubin, UA Negative Negative   Urobilinogen, Ur 0.2 0.2 - 1.0 mg/dL   Nitrite, UA Negative Negative   Microscopic Examination Comment     Comment: Microscopic not indicated and not performed.  Vitamin D (25 hydroxy)     Status: Abnormal   Collection Time: 08/09/18  8:54 AM  Result Value Ref Range   Vit D, 25-Hydroxy 26.7 (L) 30.0 - 100.0 ng/mL    Comment: Vitamin D deficiency has been defined by the [place] practice guideline as a level of serum 25-OH vitamin D less than 20 ng/mL (1,2). The Endocrine Society went on to further define vitamin D insufficiency as a level between 21 and 29 ng/mL (2). 1. IOM (Institute of Medicine). 2010. Dietary reference    intakes for calcium and D. [place]: The    [employer]. 2. Holick MF, Binkley Winton, Bischoff-Ferrari HA, et al.    Evaluation, treatment, and prevention of vitamin D    deficiency: an Endocrine Society clinical practice    guideline. JCEM. 2011 Jul; 96(7):1911-30.   Microalbumin / creatinine urine ratio     Status: Abnormal   Collection Time: 08/09/18  8:54 AM  Result Value Ref Range   Creatinine, Urine 31.2 Not Estab. mg/dL   Microalbumin, Urine 40.1 Not Estab. ug/mL   Microalb/Creat Ratio 129 (H) 0 - 29 mg/g creat    Comment:                        Normal:                0 -  29                        Moderately increased: 30 - 300                        Severely increased:       >300               **Please note reference interval change**   Sodium, urine, random     Status: None   Collection Time: 08/09/18  8:54 AM  Result Value Ref Range   Sodium, Ur 85 Not Estab. mmol/L  Iron, TIBC and Ferritin Panel     Status: None   Collection Time: 08/09/18  8:54 AM  Result Value Ref Range   Total Iron Binding Capacity 359 250 - 450  ug/dL   UIBC 248 118 - 369 ug/dL   Iron 111 27 - 139 ug/dL   Iron Saturation 31 15 - 55 %   Ferritin 26 15 - 150 ng/mL  Specimen status report     Status: None   Collection Time: 08/09/18  8:54 AM  Result Value Ref Range   specimen status report Comment     Comment: Verbal Order See below: Comment: Please provide requested information and fax to [phone] or [phone]. The [place] Code of [employer] requires a written and signed request be forwarded to a laboratory following a verbal order of a laboratory test.  Please assist [place] to meet this requirement and to complete our records. Date:______________________________ ICD-9/10  Diagnosis Code(s):___________________________________________ Physician or Authorized Designee:_____________________________________                                               Please Print Physician or Authorized Designee Signature: ______________________________________________________________________ Your Animal nutritionist Your Order Of The Test(s) Listed Additional Test(s) Requested Comment: Test(s) added per Elpidio Galea at account 08-13-2018 Logged by Ezzard Standing Test# [id number] Osmolality Test# 109323 Osmolality, Urine Diagnosis Codes Provided    E87.1 Verbal Order This  is the second notice requesting this information. An IMMEDIATE response is needed.   Osmolality     Status: None   Collection Time: 08/09/18  8:54 AM  Result Value Ref Range   Osmolality Meas CANCELED mOsmol/kg    Comment: We have received your request for additional testing, however we are unable to add the test you requested.  The following test(s) were not performed:  Result canceled by the ancillary.   Osmolality, urine     Status: None   Collection Time: 08/09/18  8:54 AM  Result Value Ref Range   Osmolality, Ur 329 mOsmol/kg    Comment:                                   24 hr :   300 -  900                                   Random:    50  - 1400                                   After 12hr fluid                                   restriction:    >850   Specimen status report     Status: None   Collection Time: 08/09/18  8:54 AM  Result Value Ref Range   specimen status report Comment     Comment: Written Authorization Written Authorization Written Authorization Received. Authorization received from Elpidio Galea 08-22-2018 Logged by Marchelle Gearing   Basic metabolic panel     Status: Abnormal   Collection Time: 09/09/18  9:35 AM  Result Value Ref Range   Sodium 131 (L) 135 - 145 mmol/L   Potassium 4.6 3.5 - 5.1 mmol/L   Chloride 98 98 - 111 mmol/L   CO2 25 22 - 32 mmol/L   Glucose, Bld 88 70 - 99 mg/dL   BUN 19 8 - 23 mg/dL   Creatinine, Ser 0.72 0.44 - 1.00 mg/dL   Calcium 9.2 8.9 - 10.3 mg/dL   GFR calc non Af Amer >60 >60 mL/min   GFR calc Af Amer >60 >60 mL/min   Anion gap 8 5 - 15    Comment: Performed at [facility], [address] [place], [place] [address]   Objective  Body mass index is 32.84 kg/m. Wt Readings from Last 3 Encounters:  09/12/18 162 lb 9.6 oz (73.8 kg)  09/02/18 162 lb 8 oz (73.7 kg)  05/06/18 160 lb (72.6 kg)   Temp Readings  from Last 3 Encounters:  09/12/18 (!) 97.1 F (36.2 C) (Oral)  09/02/18 98 F (36.7 C)  05/06/18 98.2 F (36.8 C) (Oral)   BP Readings from Last 3 Encounters:  09/12/18 130/80  09/02/18 (!) 180/80  08/22/18 (!) 200/88   Pulse Readings from Last 3 Encounters:  09/12/18 65  09/02/18 64  08/22/18 (!) 55    Physical Exam Vitals signs and nursing note reviewed.  Constitutional:      Appearance: Normal appearance. She is well-developed and well-groomed. She is obese.  HENT:     Head: Normocephalic and atraumatic.     Comments: Mask on      Nose: Nose normal.     Mouth/Throat:     Mouth: Mucous membranes are moist.     Pharynx: Oropharynx is clear.  Eyes:     Conjunctiva/sclera: Conjunctivae normal.     Pupils: Pupils are equal,  round, and reactive to light.  Cardiovascular:     Rate and Rhythm: Normal rate and regular rhythm.     Heart sounds: Normal heart sounds.  Pulmonary:     Effort: Pulmonary effort is normal.     Breath sounds: Normal breath sounds.  Musculoskeletal:     Right lower leg: No edema.     Left lower leg: No edema.  Skin:    General: Skin is warm and dry.  Neurological:     General: No focal deficit present.     Mental Status: She is alert and oriented to person, place, and time. Mental status is at baseline.  Psychiatric:        Attention and Perception: Attention and perception normal.        Mood and Affect: Mood and affect normal.        Speech: Speech normal.        Behavior: Behavior normal. Behavior is cooperative.        Thought Content: Thought content normal.        Cognition and Memory: Cognition and memory normal.        Judgment: Judgment normal.     Assessment   1. HTN/hld 2. HM Plan   1. Cont meds  Monitor sodium check in 1 month  sch fasting labs at f/u lipid repeat after 3 months zetia  2.  HM Flu shot utd  Tdap had 10/18/10  Consider shingrix  Given Rx today had zostervax  prevnar x 1 today pna 23 in 1 year   Osteopenia 12/31/14 dexa consider repeat in future Mammogram 12/29/14 neg referred today  Colonoscopy dr. Tiffany Kocher h/o polyps and mom FH colon cancer  -does not qualify cologuard Skin normal today      Provider: Dr. Olivia Mackie McLean-Scocuzza-Internal Medicine

## 2018-10-15 ENCOUNTER — Ambulatory Visit
Admission: RE | Admit: 2018-10-15 | Discharge: 2018-10-15 | Disposition: A | Discharge status: Home / Self Care | Payer: Medicare PPO | Source: Ambulatory Visit | Attending: Internal Medicine | Admitting: Internal Medicine

## 2018-10-15 DIAGNOSIS — Z1231 Encounter for screening mammogram for malignant neoplasm of breast: Secondary | ICD-10-CM | POA: Insufficient documentation

## 2018-10-17 ENCOUNTER — Other Ambulatory Visit (INDEPENDENT_AMBULATORY_CARE_PROVIDER_SITE_OTHER): Payer: Medicare PPO

## 2018-10-17 ENCOUNTER — Other Ambulatory Visit: Payer: Self-pay

## 2018-10-17 ENCOUNTER — Telehealth: Payer: Self-pay | Admitting: Internal Medicine

## 2018-10-17 DIAGNOSIS — E871 Hypo-osmolality and hyponatremia: Secondary | ICD-10-CM | POA: Diagnosis not present

## 2018-10-17 LAB — BASIC METABOLIC PANEL
BUN: 14 mg/dL (ref 6–23)
CO2: 27 mEq/L (ref 19–32)
Calcium: 9.8 mg/dL (ref 8.4–10.5)
Chloride: 90 mEq/L — ABNORMAL LOW (ref 96–112)
Creatinine, Ser: 0.73 mg/dL (ref 0.40–1.20)
GFR: 75.88 mL/min (ref >60.00)
Glucose, Bld: 66 mg/dL — ABNORMAL LOW (ref 70–99)
Potassium: 4.6 mEq/L (ref 3.5–5.1)
Sodium: 123 mEq/L — ABNORMAL LOW (ref 135–145)

## 2018-10-17 NOTE — Telephone Encounter (Signed)
Pt would like all her medications transferred to [facility].

## 2018-10-18 ENCOUNTER — Other Ambulatory Visit: Payer: Self-pay | Admitting: Internal Medicine

## 2018-10-18 DIAGNOSIS — I10 Essential (primary) hypertension: Secondary | ICD-10-CM

## 2018-10-18 DIAGNOSIS — E785 Hyperlipidemia, unspecified: Secondary | ICD-10-CM

## 2018-10-18 DIAGNOSIS — K219 Gastro-esophageal reflux disease without esophagitis: Secondary | ICD-10-CM

## 2018-10-18 DIAGNOSIS — E871 Hypo-osmolality and hyponatremia: Secondary | ICD-10-CM

## 2018-10-18 MED ORDER — SPIRONOLACTONE 25 MG PO TABS: 25.0000 mg | ORAL_TABLET | Freq: Every day | ORAL | 3 refills | Status: DC

## 2018-10-18 MED ORDER — CARVEDILOL 3.125 MG PO TABS: 3.1250 mg | ORAL_TABLET | Freq: Two times a day (BID) | ORAL | 3 refills | Status: DC

## 2018-10-18 MED ORDER — CLONIDINE 0.1 MG/24HR TD PTWK: 0.1000 mg | MEDICATED_PATCH | TRANSDERMAL | 3 refills | Status: DC

## 2018-10-18 MED ORDER — EZETIMIBE 10 MG PO TABS: 10.0000 mg | ORAL_TABLET | Freq: Every day | ORAL | 3 refills | Status: DC

## 2018-10-18 MED ORDER — LISINOPRIL 40 MG PO TABS: 40.0000 mg | ORAL_TABLET | Freq: Every day | ORAL | 3 refills | Status: DC

## 2018-10-18 MED ORDER — ESOMEPRAZOLE MAGNESIUM 20 MG PO CPDR: 20.0000 mg | DELAYED_RELEASE_CAPSULE | Freq: Every day | ORAL | 3 refills | Status: DC

## 2018-10-18 NOTE — Telephone Encounter (Signed)
Cardiology wants to refer to renal for low sodium referral placed   [place]

## 2018-10-24 ENCOUNTER — Telehealth: Payer: Self-pay | Admitting: Internal Medicine

## 2018-10-24 ENCOUNTER — Telehealth: Payer: Self-pay

## 2018-10-24 ENCOUNTER — Other Ambulatory Visit: Payer: Self-pay | Admitting: Internal Medicine

## 2018-10-24 DIAGNOSIS — L231 Allergic contact dermatitis due to adhesives: Secondary | ICD-10-CM

## 2018-10-24 MED ORDER — TRIAMCINOLONE ACETONIDE 0.1 % EX CREA: 1.0000 "application " | TOPICAL_CREAM | Freq: Two times a day (BID) | CUTANEOUS | 0 refills | Status: DC

## 2018-10-24 NOTE — Telephone Encounter (Signed)
Copied from [place] [id number]. Topic: General - Other >> Oct 24, 2018  1:12 PM Ivar Drape wrote: Reason for CRM:  The patient cloNIDine (CATAPRES - DOSED IN MG/24 HR) 0.1 mg/24hr patch is casuing a rash under the patch and it itches.  She has been putting medicated lotion on it, and it takes care of the itch, but the rash stays.  Please advise.

## 2018-10-24 NOTE — Telephone Encounter (Signed)
Does she want to try the clonidine pill daily?   Sent steroid cream to use on area where patch was   [place] when is renal appt?

## 2018-10-24 NOTE — Telephone Encounter (Unsigned)
Copied from [place] [phone] [id number]. Topic: General - Other >> Oct 24, 2018  1:12 PM Ivar Drape wrote: Reason for CRM:  The patient cloNIDine (CATAPRES - DOSED IN MG/24 HR) 0.1 mg/24hr patch is casuing a rash under the patch and it itches.  She has been putting medicated lotion on it, and it takes care of the itch, but the rash stays.  Please advise.

## 2018-10-25 ENCOUNTER — Other Ambulatory Visit: Payer: Self-pay | Admitting: Internal Medicine

## 2018-10-25 DIAGNOSIS — I10 Essential (primary) hypertension: Secondary | ICD-10-CM

## 2018-10-25 MED ORDER — CLONIDINE HCL 0.1 MG PO TABS: 0.1000 mg | ORAL_TABLET | Freq: Every day | ORAL | 3 refills | Status: DC

## 2018-10-25 NOTE — Telephone Encounter (Signed)
Patient is ok with medication change and the steroid cream

## 2018-10-25 NOTE — Telephone Encounter (Signed)
She was scheduled with [facility] on 9/8. I just sent her new referral to them yesterday so it has not been scheduled yet.

## 2018-10-29 ENCOUNTER — Telehealth: Payer: Self-pay

## 2018-10-29 ENCOUNTER — Other Ambulatory Visit: Payer: Self-pay | Admitting: Internal Medicine

## 2018-10-29 DIAGNOSIS — I10 Essential (primary) hypertension: Secondary | ICD-10-CM

## 2018-10-29 NOTE — Telephone Encounter (Signed)
Copied from [place] [id number]. Topic: General - Other >> Oct 29, 2018  4:36 PM Sheran Luz wrote: Patient requesting call back from [place] to discuss referral to [facility]. She would like to know more information about the tests she is to have done and why.

## 2018-10-30 NOTE — Telephone Encounter (Signed)
rec due to low sodium cardiology rec as well  Has appt been scheduled kidney doctor?   [place]

## 2018-10-31 ENCOUNTER — Telehealth: Payer: Self-pay | Admitting: Internal Medicine

## 2018-10-31 NOTE — Telephone Encounter (Signed)
Not sure why keep getting refills from [facility] I have sent all her meds I.e coreg to [facility] for a year supply   Which pharmacy is she using either 1 or the other ?  [place]

## 2018-10-31 NOTE — Telephone Encounter (Signed)
Yes, 10/20

## 2018-10-31 NOTE — Telephone Encounter (Signed)
She uses both. [facility], [facility]

## 2018-11-19 ENCOUNTER — Telehealth: Payer: Self-pay | Admitting: Cardiovascular Disease

## 2018-11-19 DIAGNOSIS — Z8542 Personal history of malignant neoplasm of other parts of uterus: Secondary | ICD-10-CM | POA: Insufficient documentation

## 2018-11-19 NOTE — Telephone Encounter (Signed)
Virtual Visit Pre-Appointment Phone Call  "(Name), I am calling you today to discuss your upcoming appointment. We are currently trying to limit exposure to the virus that causes COVID-19 by seeing patients at home rather than in the office."  1. "What is the BEST phone number to call the day of the visit?" - include this in appointment notes  2. Do you have or have access to (through a family member/friend) a smartphone with video capability that we can use for your visit?" a. If yes - list this number in appt notes as cell (if different from BEST phone #) and list the appointment type as a VIDEO visit in appointment notes b. If no - list the appointment type as a PHONE visit in appointment notes  3. Confirm consent - "In the setting of the current Covid19 crisis, you are scheduled for a (phone or video) visit with your provider on (date) at (time).  Just as we do with many in-office visits, in order for you to participate in this visit, we must obtain consent.  If you'd like, I can send this to your mychart (if signed up) or email for you to review.  Otherwise, I can obtain your verbal consent now.  All virtual visits are billed to your insurance company just like a normal visit would be.  By agreeing to a virtual visit, we'd like you to understand that the technology does not allow for your provider to perform an examination, and thus may limit your provider's ability to fully assess your condition. If your provider identifies any concerns that need to be evaluated in person, we will make arrangements to do so.  Finally, though the technology is pretty good, we cannot assure that it will always work on either your or our end, and in the setting of a video visit, we may have to convert it to a phone-only visit.  In either situation, we cannot ensure that we have a secure connection.  Are you willing to proceed?" STAFF: Did the patient verbally acknowledge consent to telehealth visit? Document  YES/NO here: YES   4. Advise patient to be prepared - "Two hours prior to your appointment, go ahead and check your blood pressure, pulse, oxygen saturation, and your weight (if you have the equipment to check those) and write them all down. When your visit starts, your provider will ask you for this information. If you have an Apple Watch or Kardia device, please plan to have heart rate information ready on the day of your appointment. Please have a pen and paper handy nearby the day of the visit as well."  5. Give patient instructions for MyChart download to smartphone OR Doximity/Doxy.me as below if video visit (depending on what platform provider is using)  6. Inform patient they will receive a phone call 15 minutes prior to their appointment time (may be from unknown caller ID) so they should be prepared to answer    TELEPHONE CALL NOTE  Jill Stanley has been deemed a candidate for a follow-up tele-health visit to limit community exposure during the Covid-19 pandemic. I spoke with the patient via phone to ensure availability of phone/video source, confirm preferred email & phone number, and discuss instructions and expectations.  I reminded Jill Stanley to be prepared with any vital sign and/or heart rhythm information that could potentially be obtained via home monitoring, at the time of her visit. I reminded Jill Stanley to expect a phone call prior  to her visit.  Caryl Pina Gerringer 11/19/2018 10:14 AM   INSTRUCTIONS FOR DOWNLOADING THE MYCHART APP TO SMARTPHONE  - The patient must first make sure to have activated MyChart and know their login information - If Apple, go to [employer] and type in MyChart in the search bar and download the app. If Android, ask patient to go to [employer] and type in [place] in the search bar and download the app. The app is free but as with any other app downloads, their phone may require them to verify saved payment information or  Apple/Android password.  - The patient will need to then log into the app with their MyChart username and password, and select [facility] as their healthcare provider to link the account. When it is time for your visit, go to the MyChart app, find appointments, and click Begin Video Visit. Be sure to Select Allow for your device to access the Microphone and Camera for your visit. You will then be connected, and your provider will be with you shortly.  **If they have any issues connecting, or need assistance please contact MyChart service desk [phone])**  **If using a computer, in order to ensure the best quality for their visit they will need to use either of the following Internet Browsers: [employer], or Google Chrome**  IF USING DOXIMITY or DOXY.ME - The patient will receive a link just prior to their visit by text.     FULL LENGTH CONSENT FOR TELE-HEALTH VISIT   I hereby voluntarily request, consent and authorize [facility] and its employed or contracted physicians, physician assistants, nurse practitioners or other licensed health care professionals (the Practitioner), to provide me with telemedicine health care services (the Services") as deemed necessary by the treating Practitioner. I acknowledge and consent to receive the Services by the Practitioner via telemedicine. I understand that the telemedicine visit will involve communicating with the Practitioner through live audiovisual communication technology and the disclosure of certain medical information by electronic transmission. I acknowledge that I have been given the opportunity to request an in-person assessment or other available alternative prior to the telemedicine visit and am voluntarily participating in the telemedicine visit.  I understand that I have the right to withhold or withdraw my consent to the use of telemedicine in the course of my care at any time, without affecting my right to future care  or treatment, and that the Practitioner or I may terminate the telemedicine visit at any time. I understand that I have the right to inspect all information obtained and/or recorded in the course of the telemedicine visit and may receive copies of available information for a reasonable fee.  I understand that some of the potential risks of receiving the Services via telemedicine include:   Delay or interruption in medical evaluation due to technological equipment failure or disruption;  Information transmitted may not be sufficient (e.g. poor resolution of images) to allow for appropriate medical decision making by the Practitioner; and/or   In rare instances, security protocols could fail, causing a breach of personal health information.  Furthermore, I acknowledge that it is my responsibility to provide information about my medical history, conditions and care that is complete and accurate to the best of my ability. I acknowledge that Practitioner's advice, recommendations, and/or decision may be based on factors not within their control, such as incomplete or inaccurate data provided by me or distortions of diagnostic images or specimens that may result from electronic transmissions. I  understand that the practice of medicine is not an exact science and that Practitioner makes no warranties or guarantees regarding treatment outcomes. I acknowledge that I will receive a copy of this consent concurrently upon execution via email to the email address I last provided but may also request a printed copy by calling the office of [facility].    I understand that my insurance will be billed for this visit.   I have read or had this consent read to me.  I understand the contents of this consent, which adequately explains the benefits and risks of the Services being provided via telemedicine.   I have been provided ample opportunity to ask questions regarding this consent and the Services and have had  my questions answered to my satisfaction.  I give my informed consent for the services to be provided through the use of telemedicine in my medical care  By participating in this telemedicine visit I agree to the above.

## 2018-12-04 DIAGNOSIS — E871 Hypo-osmolality and hyponatremia: Secondary | ICD-10-CM | POA: Diagnosis not present

## 2018-12-06 ENCOUNTER — Ambulatory Visit: Payer: Medicare PPO | Admitting: Cardiovascular Disease

## 2018-12-13 ENCOUNTER — Ambulatory Visit: Payer: Medicare PPO | Admitting: Internal Medicine

## 2018-12-19 ENCOUNTER — Other Ambulatory Visit: Payer: Self-pay

## 2018-12-19 ENCOUNTER — Encounter: Payer: Self-pay | Admitting: Internal Medicine

## 2018-12-19 ENCOUNTER — Ambulatory Visit (INDEPENDENT_AMBULATORY_CARE_PROVIDER_SITE_OTHER): Payer: Medicare PPO | Admitting: Internal Medicine

## 2018-12-19 VITALS — BP 119/66 | HR 69 | Ht 59.0 in | Wt 160.0 lb

## 2018-12-19 DIAGNOSIS — I10 Essential (primary) hypertension: Secondary | ICD-10-CM | POA: Diagnosis not present

## 2018-12-19 DIAGNOSIS — E559 Vitamin D deficiency, unspecified: Secondary | ICD-10-CM | POA: Diagnosis not present

## 2018-12-19 DIAGNOSIS — K219 Gastro-esophageal reflux disease without esophagitis: Secondary | ICD-10-CM

## 2018-12-19 DIAGNOSIS — E785 Hyperlipidemia, unspecified: Secondary | ICD-10-CM | POA: Diagnosis not present

## 2018-12-19 MED ORDER — CLONIDINE HCL 0.1 MG PO TABS: 0.1000 mg | ORAL_TABLET | Freq: Every day | ORAL | 3 refills | Status: DC

## 2018-12-19 NOTE — Progress Notes (Signed)
Telephone Note  I connected with Jill Stanley  on [id number] at  9:30 AM EST by telephone and verified that I am speaking with the correct person using two identifiers.  Location patient: home Location provider:work or home office Persons participating in the virtual visit: patient, provider  I discussed the limitations of evaluation and management by telemedicine and the availability of in person appointments. The patient expressed understanding and agreed to proceed.   HPI: HTN controlled 107-120s/60s on clonidine 0.1 qhs, lis 40 mg qd, spironlactone 25 mg qd, coreg 3.125 bid   hld on zetia 10   gerd on nexium 20 mg qd    ROS: See pertinent positives and negatives per HPI.  Past Medical History:  Diagnosis Date  . Arthritis   . Chronic low back pain   . Complication of anesthesia    "All my muscles were sore all over my body was sore the next day.. due to mixture of gases"  . Endometrial cancer ([place])   . GERD (gastroesophageal reflux disease)   . Heart murmur 2018   tiney murmur, no treatment needed  . Hypertension   . Hyponatremia    chronic   . Osteopenia   . Prediabetes   . RLS (restless legs syndrome)   . Ulcer of the stomach caused by bacteria (H. pylori)    history of    Past Surgical History:  Procedure Laterality Date  . ABDOMINAL HYSTERECTOMY  1992  . BACK SURGERY  1989   L3, 4 and 5  . DILATION AND CURETTAGE OF UTERUS  1992  . KNEE ARTHROPLASTY Left 08/09/2016   Procedure: COMPUTER ASSISTED TOTAL KNEE ARTHROPLASTY;  Surgeon: Dereck Leep, MD;  Location: [facility] ORS;  Service: Orthopedics;  Laterality: Left;  . KNEE ARTHROSCOPY W/ MENISCAL REPAIR Left     Family History  Problem Relation Age of Onset  . Breast cancer Daughter 43  . Aneurysm Grandson        brain age 77 y.o   . Alzheimer's disease Father     SOCIAL HX:  Married lives with husband Pieper Rolling who is DPR  03/2019 married 65 years    Current Outpatient Medications:  .   acetaminophen (TYLENOL) 650 MG CR tablet, Take 1,300 mg by mouth every 8 (eight) hours as needed for pain., Disp: , Rfl:  .  Calcium Carbonate Antacid (TUMS PO), Take by mouth., Disp: , Rfl:  .  Calcium Carbonate-Vitamin D3 (CALCIUM 600-D) 600-400 MG-UNIT TABS, Take 2 tablets by mouth 2 (two) times a day. , Disp: , Rfl:  .  calcium elemental as carbonate (TUMS ULTRA 1000) 400 MG chewable tablet, Chew 1,000 mg by mouth as needed for heartburn., Disp: , Rfl:  .  carvedilol (COREG) 3.125 MG tablet, Take 1 tablet (3.125 mg total) by mouth 2 (two) times daily with a meal., Disp: 180 tablet, Rfl: 3 .  cloNIDine (CATAPRES) 0.1 MG tablet, Take 1 tablet (0.1 mg total) by mouth daily., Disp: 90 tablet, Rfl: 3 .  esomeprazole (NEXIUM) 20 MG capsule, Take 1 capsule (20 mg total) by mouth daily at 12 noon., Disp: 90 capsule, Rfl: 3 .  ezetimibe (ZETIA) 10 MG tablet, Take 1 tablet (10 mg total) by mouth daily., Disp: 90 tablet, Rfl: 3 .  lisinopril (ZESTRIL) 40 MG tablet, Take 1 tablet (40 mg total) by mouth daily., Disp: 90 tablet, Rfl: 3 .  meloxicam (MOBIC) 15 MG tablet, Take 15 mg by mouth daily., Disp: , Rfl:  .  methylcellulose  oral powder, Take 1 packet by mouth 2 (two) times daily., Disp: , Rfl:  .  sodium chloride (OCEAN) 0.65 % SOLN nasal spray, Place 2 sprays into both nostrils as needed for congestion., Disp: , Rfl:  .  spironolactone (ALDACTONE) 25 MG tablet, Take 1 tablet (25 mg total) by mouth daily., Disp: 90 tablet, Rfl: 3 .  triamcinolone cream (KENALOG) 0.1 %, Apply 1 application topically 2 (two) times daily. Arm prn x 7-14 days, Disp: 30 g, Rfl: 0  EXAM:  VITALS per patient if applicable:  GENERAL: alert, oriented, appears well and in no acute distress  PSYCH/NEURO: pleasant and cooperative, no obvious depression or anxiety, speech and thought processing grossly intact  ASSESSMENT AND PLAN:  Discussed the following assessment and plan:  Hyperlipidemia, unspecified hyperlipidemia  type - Plan: Lipid panel Cont zetia 10   Essential hypertension - Plan: cloNIDine (CATAPRES) 0.1 MG tablet with other meds BP controlled monitor sodium   CBC with Differential/Platelet  Gastroesophageal reflux disease, unspecified whether esophagitis present nexium 20 mg qd is helping  If flares again consider referral back [facility]   Vitamin D deficiency - Plan: Vitamin D (25 hydroxy)  HM Flu shot utd  Tdap had 10/18/10  Consider shingrix  Given Rx has note had had zostervax  prevnar x 1 today pna 23 in 1 year 09/12/19  Osteopenia 12/31/14 dexa consider repeat in future Mammogram 10/15/2018 negative  Colonoscopy dr. Tiffany Kocher h/o polyps and mom FH colon cancer  -does not qualify cologuard Skin normal previously   K[facility]ney Dr. Wallace Keller appt 12/30/2018  Dr. Fletcher Anon cards  Of note husband has tongue cancer has G tube, trach, tongue graft as of 12/19/2018   -we discussed possible serious and likely etiologies, options for evaluation and workup, limitations of telemedicine visit vs in person visit, treatment, treatment risks and precautions. Pt prefers to treat via telemedicine empirically rather then risking or undertaking an in person visit at this moment. Patient agrees to seek prompt in person care if worsening, new symptoms arise, or if is not improving with treatment.   I discussed the assessment and treatment plan with the patient. The patient was provided an opportunity to ask questions and all were answered. The patient agreed with the plan and demonstrated an understanding of the instructions.   The patient was advised to call back or seek an in-person evaluation if the symptoms worsen or if the condition fails to improve as anticipated.  Time spent 15 minutes  Delorise Jackson, MD

## 2018-12-30 DIAGNOSIS — E871 Hypo-osmolality and hyponatremia: Secondary | ICD-10-CM | POA: Diagnosis not present

## 2018-12-30 DIAGNOSIS — I10 Essential (primary) hypertension: Secondary | ICD-10-CM | POA: Diagnosis not present

## 2019-01-02 ENCOUNTER — Telehealth: Payer: Medicare PPO | Admitting: Cardiovascular Disease

## 2019-03-13 ENCOUNTER — Other Ambulatory Visit: Payer: Self-pay | Admitting: Internal Medicine

## 2019-03-13 ENCOUNTER — Other Ambulatory Visit (INDEPENDENT_AMBULATORY_CARE_PROVIDER_SITE_OTHER): Payer: Medicare PPO

## 2019-03-13 ENCOUNTER — Other Ambulatory Visit: Payer: Self-pay

## 2019-03-13 DIAGNOSIS — I10 Essential (primary) hypertension: Secondary | ICD-10-CM

## 2019-03-13 DIAGNOSIS — E785 Hyperlipidemia, unspecified: Secondary | ICD-10-CM

## 2019-03-13 DIAGNOSIS — E559 Vitamin D deficiency, unspecified: Secondary | ICD-10-CM | POA: Diagnosis not present

## 2019-03-13 DIAGNOSIS — E871 Hypo-osmolality and hyponatremia: Secondary | ICD-10-CM | POA: Diagnosis not present

## 2019-03-13 LAB — CBC WITH DIFFERENTIAL/PLATELET
Basophils Absolute: 0 10*3/uL (ref 0.0–0.1)
Basophils Relative: 0.4 % (ref 0.0–3.0)
Eosinophils Absolute: 0.1 10*3/uL (ref 0.0–0.7)
Eosinophils Relative: 0.8 % (ref 0.0–5.0)
HCT: 42.1 % (ref 36.0–46.0)
Hemoglobin: 14 g/dL (ref 12.0–15.0)
Lymphocytes Relative: 39 % (ref 12.0–46.0)
Lymphs Abs: 2.9 10*3/uL (ref 0.7–4.0)
MCHC: 33.3 g/dL (ref 30.0–36.0)
MCV: 91.5 fl (ref 78.0–100.0)
Monocytes Absolute: 0.5 10*3/uL (ref 0.1–1.0)
Monocytes Relative: 6.3 % (ref 3.0–12.0)
Neutro Abs: 4 10*3/uL (ref 1.4–7.7)
Neutrophils Relative %: 53.5 % (ref 43.0–77.0)
Platelets: 246 10*3/uL (ref 150.0–400.0)
RBC: 4.6 Mil/uL (ref 3.87–5.11)
RDW: 13.1 % (ref 11.5–15.5)
WBC: 7.5 10*3/uL (ref 4.0–10.5)

## 2019-03-13 LAB — LIPID PANEL
Cholesterol: 193 mg/dL (ref 0–200)
HDL: 64 mg/dL (ref >39.00)
LDL Cholesterol: 105 mg/dL — ABNORMAL HIGH (ref 0–99)
NonHDL: 128.66
Total CHOL/HDL Ratio: 3
Triglycerides: 120 mg/dL (ref 0.0–149.0)
VLDL: 24 mg/dL (ref 0.0–40.0)

## 2019-03-13 LAB — VITAMIN D 25 HYDROXY (VIT D DEFICIENCY, FRACTURES): VITD: 35.83 ng/mL (ref 30.00–100.00)

## 2019-03-13 LAB — COMPREHENSIVE METABOLIC PANEL
ALT: 12 U/L (ref 0–35)
AST: 19 U/L (ref 0–37)
Albumin: 4.1 g/dL (ref 3.5–5.2)
Alkaline Phosphatase: 65 U/L (ref 39–117)
BUN: 17 mg/dL (ref 6–23)
CO2: 27 mEq/L (ref 19–32)
Calcium: 9.8 mg/dL (ref 8.4–10.5)
Chloride: 96 mEq/L (ref 96–112)
Creatinine, Ser: 0.77 mg/dL (ref 0.40–1.20)
GFR: 71.28 mL/min (ref >60.00)
Glucose, Bld: 88 mg/dL (ref 70–99)
Potassium: 4.1 mEq/L (ref 3.5–5.1)
Sodium: 131 mEq/L — ABNORMAL LOW (ref 135–145)
Total Bilirubin: 0.5 mg/dL (ref 0.2–1.2)
Total Protein: 6.9 g/dL (ref 6.0–8.3)

## 2019-03-14 NOTE — Progress Notes (Signed)
Left message to return call 

## 2019-03-18 ENCOUNTER — Encounter: Payer: Self-pay | Admitting: Internal Medicine

## 2019-03-20 ENCOUNTER — Ambulatory Visit (INDEPENDENT_AMBULATORY_CARE_PROVIDER_SITE_OTHER): Payer: Medicare PPO

## 2019-03-20 ENCOUNTER — Other Ambulatory Visit: Payer: Self-pay

## 2019-03-20 VITALS — Ht 59.0 in | Wt 160.0 lb

## 2019-03-20 DIAGNOSIS — Z0000 Encounter for general adult medical examination without abnormal findings: Secondary | ICD-10-CM

## 2019-03-20 NOTE — Progress Notes (Signed)
Subjective:   Jill Stanley is a 86 y.o. female who presents for an Initial Medicare Annual Wellness Visit.  Review of Systems    No ROS.  Medicare Wellness Virtual Visit.  Visual/audio telehealth visit, UTA vital signs.   Ht/Wt provided. See social history for additional risk factors.  Cardiac Risk Factors include: advanced age (>41men, >72 women);hypertension     Objective:    Today's Vitals   03/20/19 1001  Weight: 160 lb (72.6 kg)  Height: 4\' 11"  (1.499 m)   Body mass index is 32.32 kg/m.  Advanced Directives 03/20/2019 05/06/2018 08/09/2016 08/09/2016 07/26/2016  Does Patient Have a Medical Advance Directive? Yes No No Yes Yes  Type of Paramedic of [place];Living will - [place];Living will [place];Living will Living will;Healthcare Power of Attorney  Does patient want to make changes to medical advance directive? No - Patient declined - No - Patient declined - No - Patient declined  Copy of [place] in Chart? Yes - validated most recent copy scanned in chart (See row information) - Yes Yes No - copy requested  Would patient like information on creating a medical advance directive? - No - Patient declined No - Patient declined - -    Current Medications (verified) Outpatient Encounter Medications as of 03/20/2019  Medication Sig   acetaminophen (TYLENOL) 650 MG CR tablet Take 1,300 mg by mouth every 8 (eight) hours as needed for pain.   Calcium Carbonate Antacid (TUMS PO) Take by mouth.   Calcium Carbonate-Vitamin D3 (CALCIUM 600-D) 600-400 MG-UNIT TABS Take 2 tablets by mouth daily.    calcium elemental as carbonate (TUMS ULTRA 1000) 400 MG chewable tablet Chew 1,000 mg by mouth as needed for heartburn.   carvedilol (COREG) 3.125 MG tablet Take 1 tablet (3.125 mg total) by mouth 2 (two) times daily with a meal.   cloNIDine (CATAPRES) 0.1 MG tablet Take 1 tablet (0.1 mg total) by  mouth daily.   esomeprazole (NEXIUM) 20 MG capsule Take 1 capsule (20 mg total) by mouth daily at 12 noon.   ezetimibe (ZETIA) 10 MG tablet Take 1 tablet (10 mg total) by mouth daily.   lisinopril (ZESTRIL) 40 MG tablet Take 1 tablet (40 mg total) by mouth daily.   meloxicam (MOBIC) 15 MG tablet Take 15 mg by mouth daily.   methylcellulose oral powder Take 1 packet by mouth 2 (two) times daily.   sodium chloride (OCEAN) 0.65 % SOLN nasal spray Place 2 sprays into both nostrils as needed for congestion.   spironolactone (ALDACTONE) 25 MG tablet Take 1 tablet (25 mg total) by mouth daily.   triamcinolone cream (KENALOG) 0.1 % Apply 1 application topically 2 (two) times daily. Arm prn x 7-14 days   VITAMIN D PO Take 4,000 Units by mouth daily.   No facility-administered encounter medications on file as of 03/20/2019.    Allergies (verified) Amlodipine, Hydralazine, Hydralazine hcl, Norvasc [amlodipine besylate], Epinephrine, Ibuprofen, Lovastatin, Morphine, Tramadol, and Zocor [simvastatin]   History: Past Medical History:  Diagnosis Date   Arthritis    Chronic low back pain    Complication of anesthesia    "All my muscles were sore all over my body was sore the next day.. due to mixture of gases"   Endometrial cancer (HCC)    GERD (gastroesophageal reflux disease)    Heart murmur 2018   tiney murmur, no treatment needed   Hypertension    Hyponatremia  chronic    Osteopenia    Prediabetes    RLS (restless legs syndrome)    Ulcer of the stomach caused by bacteria (H. pylori)    history of   Past Surgical History:  Procedure Laterality Date   ABDOMINAL HYSTERECTOMY  1992   BACK SURGERY  1989   L3, 4 and 5   DILATION AND CURETTAGE OF UTERUS  1992   KNEE ARTHROPLASTY Left 08/09/2016   Procedure: COMPUTER ASSISTED TOTAL KNEE ARTHROPLASTY;  Surgeon: Dereck Leep, MD;  Location: [facility] ORS;  Service: Orthopedics;  Laterality: Left;   KNEE ARTHROSCOPY W/  MENISCAL REPAIR Left    Family History  Problem Relation Age of Onset   Breast cancer Daughter 32   Aneurysm 44        brain age 37 y.o    Alzheimer's disease Father    Social History   Socioeconomic History   Marital status: Married    Spouse name: Not on file   Number of children: Not on file   Years of education: Not on file   Highest education level: Not on file  Occupational History   Not on file  Tobacco Use   Smoking status: Never Smoker   Smokeless tobacco: Never Used  Substance and Sexual Activity   Alcohol use: Yes    Comment: a glass of wine 2-3 times a year   Drug use: No   Sexual activity: Not Currently  Other Topics Concern   Not on file  Social History Narrative   Married lives with husband Jill Stanley who is DPR    03/2019 married 24 years    Social Determinants of Radio broadcast assistant Strain: Low Risk    Difficulty of Paying Living Expenses: Not hard at all  Food Insecurity: No Food Insecurity   Worried About Charity fundraiser in the Last Year: Never true   Arboriculturist in the Last Year: Never true  Transportation Needs: No Transportation Needs   Lack of Transportation (Medical): No   Lack of Transportation (Non-Medical): No  Physical Activity:    Days of Exercise per Week: Not on file   Minutes of Exercise per Session: Not on file  Stress: No Stress Concern Present   Feeling of Stress : Only a little  Social Connections: Unknown   Frequency of Communication with Friends and Family: More than three times a week   Frequency of Social Gatherings with Friends and Family: Not on file   Attends Religious Services: Not on Electrical engineer or Organizations: Not on file   Attends Archivist Meetings: Not on file   Marital Status: Married    Tobacco Counseling Counseling given: Not Answered   Clinical Intake:  Pre-visit preparation completed: Yes        Diabetes:  No  How often do you need to have someone help you when you read instructions, pamphlets, or other written materials from your doctor or pharmacy?: 1 - Never         Activities of Daily Living In your present state of health, do you have any difficulty performing the following activities: 03/20/2019  Hearing? N  Vision? N  Difficulty concentrating or making decisions? N  Walking or climbing stairs? N  Dressing or bathing? N  Doing errands, shopping? N  Preparing Food and eating ? N  Using the Toilet? N  In the past six months, have you accidently leaked urine? Darreld Mclean  Comment Managed with pad as needed  Do you have problems with loss of bowel control? N  Managing your Medications? N  Managing your Finances? N  Housekeeping or managing your Housekeeping? Y  Comment Husband assists as needed  Some recent data might be hidden     Immunizations and Health Maintenance Immunization History  Administered Date(s) Administered   Influenza, High Dose Seasonal PF 11/29/2016, 10/17/2018   Influenza,inj,Quad PF,6+ Mos 10/29/2017   Pneumococcal Conjugate-13 09/12/2018   Tdap 10/18/2010   Zoster 05/01/2012   Health Maintenance Due  Topic Date Due   DEXA SCAN  05/27/1999    Patient Care Team: McLean-Scocuzza, Nino Glow, MD as PCP - General (Internal Medicine)  Indicate any recent Medical Services you may have received from other than [facility] providers in the past year (date may be approximate).     Assessment:   This is a routine wellness examination for Jill Stanley.   Nurse connected with patient 03/20/19 at 10:00 AM EST by a telephone enabled telemedicine application and verified that I am speaking with the correct person using two identifiers. Patient stated full name and DOB. Patient gave permission to continue with virtual visit. Patient's location was at home and Nurse's location was at [place] office.   Patient is alert and oriented x3. Patient denies difficulty focusing or  concentrating. Patient likes to read, use the computer games and complete crossword puzzles for brain stimulation.   Health Maintenance Due: -Dexa Scan- postponed per patient preference. See completed HM at the end of note.   Eye: Visual acuity not assessed. Virtual visit. Followed by their ophthalmologist.  Dental: UTD  Hearing: Hearing aids- yes  Safety:  Patient feels safe at home- yes Patient does have smoke detectors at home- yes Patient does wear sunscreen or protective clothing when in direct sunlight - yes Patient does wear seat belt when in a moving vehicle - yes Patient drives- yes Adequate lighting in walkways free from debris- yes Grab bars and handrails used as appropriate- yes Ambulates with an assistive device- no Cell phone on person when ambulating outside of the home- yes  Social: Alcohol intake - yes, social      Smoking history- never   Smokers in home? none Illicit drug use? none  Medication: Taking as directed and without issues.  Pill box in use -yes  Self managed - yes  Covid-19: Precautions and sickness symptoms discussed. Wears mask, social distancing, hand hygiene as appropriate.   Activities of Daily Living Patient denies needing assistance with: household chores, feeding themselves, getting from bed to chair, getting to the toilet, bathing/showering, dressing, managing money, or preparing meals.   Discussed the importance of a healthy diet, water intake and the benefits of aerobic exercise.  Physical activity- active around the home. Plans to resume TOPS program when able.   Diet:  Regular Water: monitored by kidney specialist; fluid restriction to 32 ounces daily. Caffeine: 2 cups of tea  Other Providers Patient Care Team: McLean-Scocuzza, Nino Glow, MD as PCP - General (Internal Medicine) Hearing/Vision screen  Hearing Screening   125Hz  250Hz  500Hz  1000Hz  2000Hz  3000Hz  4000Hz  6000Hz  8000Hz   Right ear:           Left ear:            Comments: Patient is able to hear conversational tones without difficulty.  No issues reported.  Vision Screening Comments: Visual acuity not assessed, virtual visit.  They have seen their ophthalmologist.     Dietary issues and exercise  activities discussed: Current Exercise Habits: Home exercise routine  Goals     Weight (lb) < 160 lb (72.6 kg)     I want to lose 4lb with portion control, and reducing sugar intake.      Depression Screen PHQ 2/9 Scores 03/20/2019 12/19/2018 09/12/2018  PHQ - 2 Score 0 0 0    Fall Risk Fall Risk  03/20/2019 12/19/2018 09/12/2018  Falls in the past year? 0 0 0  Number falls in past yr: - - 0  Follow up Falls evaluation completed - -    Timed Get Up and Go Performed no, virtual visit  Cognitive Function:     6CIT Screen 03/20/2019  What Year? 0 points  What month? 0 points  What time? 0 points  Count back from 20 0 points  Months in reverse 0 points  Repeat phrase 0 points  Total Score 0    Screening Tests Health Maintenance  Topic Date Due   DEXA SCAN  05/27/1999   PNA vac Low Risk Adult (2 of 2 - PPSV23) 09/12/2019   TETANUS/TDAP  10/17/2020   INFLUENZA VACCINE  Completed        Plan:    Keep all routine maintenance appointments.   Follow up 03/28/19 @ 9:30. Discuss Dexa Scan.  Medicare Attestation I have personally reviewed: The patient's medical and social history Their use of alcohol, tobacco or illicit drugs Their current medications and supplements The patient's functional ability including ADLs,fall risks, home safety risks, cognitive, and hearing and visual impairment Diet and physical activities Evidence for depression   I have reviewed and discussed with patient certain preventive protocols, quality metrics, and best practice recommendations.     Varney Biles, LPN   [id number]

## 2019-03-20 NOTE — Patient Instructions (Addendum)
  Jill Stanley , Thank you for taking time to come for your Medicare Wellness Visit. I appreciate your ongoing commitment to your health goals. Please review the following plan we discussed and let me know if I can assist you in the future.   These are the goals we discussed: Goals    . Weight (lb) < 160 lb (72.6 kg)     I want to lose 4lb with portion control, and reducing sugar intake.       This is a list of the screening recommended for you and due dates:  Health Maintenance  Topic Date Due  . DEXA scan (bone density measurement)  05/27/1999  . Pneumonia vaccines (2 of 2 - PPSV23) 09/12/2019  . Tetanus Vaccine  10/17/2020  . Flu Shot  Completed

## 2019-03-21 ENCOUNTER — Telehealth: Payer: Self-pay | Admitting: Internal Medicine

## 2019-03-21 NOTE — Telephone Encounter (Signed)
Spoke with patient and clarified her dose. She will supplement with her current combination pill to reach a total of 4000 IU.

## 2019-03-21 NOTE — Telephone Encounter (Signed)
Total all added together rec 4000 IU daily  So vitamin D and calcium and may need to supplement with extra

## 2019-03-21 NOTE — Telephone Encounter (Signed)
Patient called in stating she was confused about her vitamin d dose.  Patient saw the mychart message from her lab results stating she needs to take 4000 iu daily of vitamin D.   Patient currently takes a combination Calcium Vit D 600-400 mg pill. Patient would like to know how much Vit D she needs to take along with the combination pill?   Please advise

## 2019-03-25 DIAGNOSIS — M79675 Pain in left toe(s): Secondary | ICD-10-CM | POA: Diagnosis not present

## 2019-03-25 DIAGNOSIS — L600 Ingrowing nail: Secondary | ICD-10-CM | POA: Diagnosis not present

## 2019-03-25 DIAGNOSIS — M79674 Pain in right toe(s): Secondary | ICD-10-CM | POA: Diagnosis not present

## 2019-03-27 ENCOUNTER — Other Ambulatory Visit: Payer: Self-pay

## 2019-03-28 ENCOUNTER — Telehealth: Payer: Self-pay | Admitting: Internal Medicine

## 2019-03-28 ENCOUNTER — Encounter: Payer: Self-pay | Admitting: Internal Medicine

## 2019-03-28 ENCOUNTER — Ambulatory Visit: Payer: Medicare PPO | Admitting: Internal Medicine

## 2019-03-28 VITALS — BP 142/82 | HR 64 | Temp 97.0°F | Ht 59.0 in | Wt 164.2 lb

## 2019-03-28 DIAGNOSIS — I10 Essential (primary) hypertension: Secondary | ICD-10-CM | POA: Diagnosis not present

## 2019-03-28 DIAGNOSIS — M79671 Pain in right foot: Secondary | ICD-10-CM

## 2019-03-28 DIAGNOSIS — M8580 Other specified disorders of bone density and structure, unspecified site: Secondary | ICD-10-CM | POA: Diagnosis not present

## 2019-03-28 DIAGNOSIS — E871 Hypo-osmolality and hyponatremia: Secondary | ICD-10-CM

## 2019-03-28 DIAGNOSIS — K6289 Other specified diseases of anus and rectum: Secondary | ICD-10-CM | POA: Diagnosis not present

## 2019-03-28 DIAGNOSIS — K648 Other hemorrhoids: Secondary | ICD-10-CM

## 2019-03-28 HISTORY — DX: Other specified diseases of anus and rectum: K62.89

## 2019-03-28 HISTORY — DX: Pain in right foot: M79.671

## 2019-03-28 NOTE — Progress Notes (Addendum)
Chief Complaint  Patient presents with  . Follow-up    Discuss need for DEXA scan    F/u  1. HTN doing better at home 130s/70s sl elevated today coreg 3.125 mg bid, clonidine 0.1 mg qd, lis 40 mg qd, spironolactone 25 mg qd and tolerating  2. Osteopenia wants DEXA  3.stress husband has to feed via G tube Q3 hours and had tongue cancer 4. C/o anal pain intermittently and uses prep H supposotories and helps x 1 month pain is intermittent does not want to see GI for now   Will get copy last colonoscopy [facility] Dr. Tiffany Kocher to check hemorrhoids 5. Right foot pain and deformity and fungus saw Dr. Elvina Mattes 03/27/19 and has topical solution to use    Review of Systems  Constitutional: Negative for weight loss.  HENT: Negative for hearing loss.   Eyes: Negative for blurred vision.  Respiratory: Negative for shortness of breath.   Cardiovascular: Negative for chest pain.  Gastrointestinal:       Anal pain at times   Musculoskeletal: Positive for joint pain.  Skin: Negative for rash.  Neurological: Negative for headaches.  Psychiatric/Behavioral: Negative for depression.       +stress    Past Medical History:  Diagnosis Date  . Arthritis   . Chronic low back pain   . Complication of anesthesia    "All my muscles were sore all over my body was sore the next day.. due to mixture of gases"  . Endometrial cancer ([place])   . GERD (gastroesophageal reflux disease)   . Heart murmur 2018   tiney murmur, no treatment needed  . Hypertension   . Hyponatremia    chronic   . Osteopenia   . Prediabetes   . RLS (restless legs syndrome)   . Ulcer of the stomach caused by bacteria (H. pylori)    history of   Past Surgical History:  Procedure Laterality Date  . ABDOMINAL HYSTERECTOMY  1992  . BACK SURGERY  1989   L3, 4 and 5  . DILATION AND CURETTAGE OF UTERUS  1992  . KNEE ARTHROPLASTY Left 08/09/2016   Procedure: COMPUTER ASSISTED TOTAL KNEE ARTHROPLASTY;  Surgeon: Dereck Leep, MD;   Location: [facility] ORS;  Service: Orthopedics;  Laterality: Left;  . KNEE ARTHROSCOPY W/ MENISCAL REPAIR Left    Family History  Problem Relation Age of Onset  . Breast cancer Daughter 34  . Aneurysm Grandson        brain age 2 y.o   . Alzheimer's disease Father    Social History   Socioeconomic History  . Marital status: Married    Spouse name: Not on file  . Number of children: Not on file  . Years of education: Not on file  . Highest education level: Not on file  Occupational History  . Not on file  Tobacco Use  . Smoking status: Never Smoker  . Smokeless tobacco: Never Used  Substance and Sexual Activity  . Alcohol use: Yes    Comment: a glass of wine 2-3 times a year  . Drug use: No  . Sexual activity: Not Currently  Other Topics Concern  . Not on file  Social History Narrative   Married lives with husband Chasney Winstel who is DPR    03/2019 married 53 years    Social Determinants of Health   Financial Resource Strain: Low Risk   . Difficulty of Paying Living Expenses: Not hard at all  Food Insecurity: No Food  Insecurity  . Worried About Charity fundraiser in the Last Year: Never true  . Ran Out of Food in the Last Year: Never true  Transportation Needs: No Transportation Needs  . Lack of Transportation (Medical): No  . Lack of Transportation (Non-Medical): No  Physical Activity:   . Days of Exercise per Week: Not on file  . Minutes of Exercise per Session: Not on file  Stress: No Stress Concern Present  . Feeling of Stress : Only a little  Social Connections: Unknown  . Frequency of Communication with Friends and Family: More than three times a week  . Frequency of Social Gatherings with Friends and Family: Not on file  . Attends Religious Services: Not on file  . Active Member of Clubs or Organizations: Not on file  . Attends Archivist Meetings: Not on file  . Marital Status: Married  Human resources officer Violence: Not At Risk  . Fear of Current  or Ex-Partner: No  . Emotionally Abused: No  . Physically Abused: No  . Sexually Abused: No   Current Meds  Medication Sig  . acetaminophen (TYLENOL) 650 MG CR tablet Take 1,300 mg by mouth every 8 (eight) hours as needed for pain.  . Calcium Carbonate Antacid (TUMS PO) Take by mouth.  . Calcium Carbonate-Vitamin D3 (CALCIUM 600-D) 600-400 MG-UNIT TABS Take 2 tablets by mouth daily.   . calcium elemental as carbonate (TUMS ULTRA 1000) 400 MG chewable tablet Chew 1,000 mg by mouth as needed for heartburn.  . carvedilol (COREG) 3.125 MG tablet Take 1 tablet (3.125 mg total) by mouth 2 (two) times daily with a meal.  . cloNIDine (CATAPRES) 0.1 MG tablet Take 1 tablet (0.1 mg total) by mouth daily.  Marland Kitchen esomeprazole (NEXIUM) 20 MG capsule Take 1 capsule (20 mg total) by mouth daily at 12 noon.  . ezetimibe (ZETIA) 10 MG tablet Take 1 tablet (10 mg total) by mouth daily.  Marland Kitchen lisinopril (ZESTRIL) 40 MG tablet Take 1 tablet (40 mg total) by mouth daily.  . meloxicam (MOBIC) 15 MG tablet Take 15 mg by mouth daily.  . sodium chloride (OCEAN) 0.65 % SOLN nasal spray Place 2 sprays into both nostrils as needed for congestion.  Marland Kitchen spironolactone (ALDACTONE) 25 MG tablet Take 25 mg by mouth daily. 8 am  . triamcinolone cream (KENALOG) 0.1 % Apply 1 application topically 2 (two) times daily. Arm prn x 7-14 days  . VITAMIN D PO Take 2,000 Units by mouth daily.    Allergies  Allergen Reactions  . Amlodipine Swelling  . Hydralazine     Rash  . Hydralazine Hcl     Rash   . Norvasc [Amlodipine Besylate]     5 mg leg swelling    . Epinephrine Anxiety    Hyperactivity  . Ibuprofen Other (See Comments)    Cannot take due to GI ulcer  . Lovastatin Diarrhea, Nausea Only and Nausea And Vomiting  . Morphine Nausea Only  . Tramadol Nausea Only    Caused nausea, "felt awful all over", and med did not help with pain "Felt awful all over", and med did not help with pain  . Zocor [Simvastatin] Diarrhea,  Nausea And Vomiting and Nausea Only    Nausea and vomiting     Recent Results (from the past 2160 hour(s))  CBC with Differential/Platelet     Status: None   Collection Time: 03/13/19  8:07 AM  Result Value Ref Range   WBC 7.5 4.0 -  10.5 K/uL   RBC 4.60 3.87 - 5.11 Mil/uL   Hemoglobin 14.0 12.0 - 15.0 g/dL   HCT 42.1 36.0 - 46.0 %   MCV 91.5 78.0 - 100.0 fl   MCHC 33.3 30.0 - 36.0 g/dL   RDW 13.1 11.5 - 15.5 %   Platelets 246.0 150.0 - 400.0 K/uL   Neutrophils Relative % 53.5 43.0 - 77.0 %   Lymphocytes Relative 39.0 12.0 - 46.0 %   Monocytes Relative 6.3 3.0 - 12.0 %   Eosinophils Relative 0.8 0.0 - 5.0 %   Basophils Relative 0.4 0.0 - 3.0 %   Neutro Abs 4.0 1.4 - 7.7 K/uL   Lymphs Abs 2.9 0.7 - 4.0 K/uL   Monocytes Absolute 0.5 0.1 - 1.0 K/uL   Eosinophils Absolute 0.1 0.0 - 0.7 K/uL   Basophils Absolute 0.0 0.0 - 0.1 K/uL  Vitamin D (25 hydroxy)     Status: None   Collection Time: 03/13/19  8:07 AM  Result Value Ref Range   VITD 35.83 30.00 - 100.00 ng/mL  Lipid panel     Status: Abnormal   Collection Time: 03/13/19  8:07 AM  Result Value Ref Range   Cholesterol 193 0 - 200 mg/dL    Comment: ATP III Classification       Desirable:  < 200 mg/dL               Borderline High:  200 - 239 mg/dL          High:  > = 240 mg/dL   Triglycerides 120.0 0.0 - 149.0 mg/dL    Comment: Normal:  <150 mg/dLBorderline High:  150 - 199 mg/dL   HDL 64.00 >39.00 mg/dL   VLDL 24.0 0.0 - 40.0 mg/dL   LDL Cholesterol 105 (H) 0 - 99 mg/dL   Total CHOL/HDL Ratio 3     Comment:                Men          Women1/2 Average Risk     3.4          3.3Average Risk          5.0          4.42X Average Risk          9.6          7.13X Average Risk          15.0          11.0                       NonHDL 128.66     Comment: NOTE:  Non-HDL goal should be 30 mg/dL higher than patient's LDL goal (i.e. LDL goal of < 70 mg/dL, would have non-HDL goal of < 100 mg/dL)  Comprehensive metabolic panel     Status:  Abnormal   Collection Time: 03/13/19  2:24 PM  Result Value Ref Range   Sodium 131 (L) 135 - 145 mEq/L   Potassium 4.1 3.5 - 5.1 mEq/L   Chloride 96 96 - 112 mEq/L   CO2 27 19 - 32 mEq/L   Glucose, Bld 88 70 - 99 mg/dL   BUN 17 6 - 23 mg/dL   Creatinine, Ser 0.77 0.40 - 1.20 mg/dL   Total Bilirubin 0.5 0.2 - 1.2 mg/dL   Alkaline Phosphatase 65 39 - 117 U/L   AST 19 0 - 37 U/L   ALT 12  0 - 35 U/L   Total Protein 6.9 6.0 - 8.3 g/dL   Albumin 4.1 3.5 - 5.2 g/dL   GFR 71.28 >60.00 mL/min   Calcium 9.8 8.4 - 10.5 mg/dL   Objective  Body mass index is 33.16 kg/m. Wt Readings from Last 3 Encounters:  03/28/19 164 lb 3.2 oz (74.5 kg)  03/20/19 160 lb (72.6 kg)  12/19/18 160 lb (72.6 kg)   Temp Readings from Last 3 Encounters:  03/28/19 (!) 97 F (36.1 C) (Temporal)  09/12/18 (!) 97.1 F (36.2 C) (Oral)  09/02/18 98 F (36.7 C)   BP Readings from Last 3 Encounters:  03/28/19 (!) 142/82  12/19/18 119/66  09/12/18 130/80   Pulse Readings from Last 3 Encounters:  03/28/19 64  12/19/18 69  09/12/18 65    Physical Exam Vitals and nursing note reviewed.  Constitutional:      Appearance: Normal appearance. She is well-developed and well-groomed. She is obese.  HENT:     Head: Normocephalic and atraumatic.  Eyes:     Conjunctiva/sclera: Conjunctivae normal.     Pupils: Pupils are equal, round, and reactive to light.  Cardiovascular:     Rate and Rhythm: Normal rate and regular rhythm.     Heart sounds: Normal heart sounds. No murmur.  Pulmonary:     Effort: Pulmonary effort is normal.     Breath sounds: Normal breath sounds.  Abdominal:     General: Abdomen is flat. Bowel sounds are normal.     Tenderness: There is no abdominal tenderness.  Neurological:     General: No focal deficit present.     Mental Status: She is alert and oriented to person, place, and time. Mental status is at baseline.     Gait: Gait normal.  Psychiatric:        Attention and Perception:  Attention and perception normal.        Mood and Affect: Mood and affect normal.        Speech: Speech normal.        Behavior: Behavior normal. Behavior is cooperative.        Thought Content: Thought content normal.        Cognition and Memory: Cognition and memory normal.        Judgment: Judgment normal.     Assessment  Plan  Essential hypertension Cont meds doing well reviewed labs hyponatremia stable  Osteopenia, unspecified location - Plan: DG Bone Density rec D3 2000 IU   Right foot pain F/u podiatry Dr. Elvina Mattes   Anal pain  -get copy of colonoscopy report [facility] Dr. Tiffany Kocher  Declines GI for now   HM Flu shot utd  Tdap had 10/18/10  Consider shingrixGiven Rx has note had had zostervax  prevnar x 1 today pna 23 in 1 year8/13/21 covid 1/2 moderna 2nd dose due 04/23/19   Osteopenia 12/31/14 dexaconsider repeat in future ordered  Mammogram 10/15/2018 negative  Colonoscopydr. Tiffany Kocher h/o polyps and mom FH colon cancer  -does not qualifycologuard -Obtained [facility] records to see if hemorrhoids Dr Tiffany Kocher 08/20/91 colonoscopy polyp sigmoid colon and IH with tubular adenoma path  08/20/91 EGD gastritis bx active chronic gastritis neg H pylori  09/14/94 colonoscopy normal except IH  Colonoscopy 01/20/00 polyp/colitis adenomatous polyp  03/26/03 colonoscopy h/o polyps 3 sessile polyps melanotic mucosa ileocecal valve path adenomatous and melanosis coli  EGD 12/21/06 12 mm sessile polyp gastric antrum normal esophagus path moderate inactive chronic gastritis neg H pylori  09/07/06 colonoscopy IH,  sessile polyp cecum 3 mm IH, small +, diverticulosis +tubular adenoma  EGD 09/07/06 normal esophagus gastric ulcers with clean base, gastric polyp, gastritis path chronic inflammation chronic gastritis   06/29/09 EGD HH, gastritis superficial vascular congestion neg H pylori   12/01/11 stomach bx antral mucosa with foveolar hyperplasia and mild superficial vascular congestion neg bx    11/30/11 colonoscopy IH small otherwise normal  egd 11/30/11 normal enlarged gastric folds neg bx   Skinnormal previously   [facility] Dr. Wallace Keller appt 12/30/2018  Dr. Fletcher Anon cards  Of note husband has tongue cancer has G tube, trach, tongue graft as of 12/19/2018   [facility]  Provider: Dr. Olivia Mackie McLean-Scocuzza-[facility]

## 2019-03-28 NOTE — Telephone Encounter (Signed)
Faxed request for patient's colonoscopy record to [facility].

## 2019-04-02 ENCOUNTER — Telehealth: Payer: Self-pay | Admitting: Internal Medicine

## 2019-04-02 NOTE — Telephone Encounter (Signed)
Pt needs clarification about bone density test. Please call pt to discuss

## 2019-04-02 NOTE — Telephone Encounter (Signed)
Spoke with [facility] to inquire about this request. They states they did not receive request. Verified fax number and re-sent this request.

## 2019-04-02 NOTE — Telephone Encounter (Signed)
Patient states that when calling [facility] to schedule her DEXA they informed her that this test would need to take the place of her previous scan as her baseline scan.   [facility] states that their machine is different from the one at [facility] where she had her last scan done. Therefore the results would not be able to be compared to one another.   Informed the patient that it is okay for her to schedule this as we can not order another scan through [facility] a [facility] doctor would have to do that. Patient states she only see Ortho at [facility] and they will not order the scan.     Patient will schedule with [facility] for DEXA.

## 2019-04-03 ENCOUNTER — Ambulatory Visit
Admission: RE | Admit: 2019-04-03 | Discharge: 2019-04-03 | Disposition: A | Discharge status: Home / Self Care | Payer: Medicare PPO | Source: Ambulatory Visit | Attending: Internal Medicine | Admitting: Internal Medicine

## 2019-04-03 DIAGNOSIS — M8580 Other specified disorders of bone density and structure, unspecified site: Secondary | ICD-10-CM | POA: Diagnosis present

## 2019-04-03 DIAGNOSIS — M8589 Other specified disorders of bone density and structure, multiple sites: Secondary | ICD-10-CM | POA: Diagnosis not present

## 2019-04-04 ENCOUNTER — Other Ambulatory Visit: Payer: Self-pay

## 2019-04-04 DIAGNOSIS — I10 Essential (primary) hypertension: Secondary | ICD-10-CM

## 2019-04-04 MED ORDER — CLONIDINE HCL 0.1 MG PO TABS: 0.1000 mg | ORAL_TABLET | Freq: Every day | ORAL | 3 refills | Status: DC

## 2019-04-07 DIAGNOSIS — K648 Other hemorrhoids: Secondary | ICD-10-CM | POA: Insufficient documentation

## 2019-04-07 NOTE — Telephone Encounter (Signed)
Records received and placed on your desk.

## 2019-04-08 NOTE — Telephone Encounter (Signed)
For your information  

## 2019-04-10 NOTE — Telephone Encounter (Signed)
Can we schedule bone density for [facility]?  Order is in can it be faxed to [facility]?   [place]

## 2019-04-28 ENCOUNTER — Encounter: Payer: Self-pay | Admitting: Internal Medicine

## 2019-07-23 DIAGNOSIS — K635 Polyp of colon: Secondary | ICD-10-CM | POA: Insufficient documentation

## 2019-07-25 DIAGNOSIS — I10 Essential (primary) hypertension: Secondary | ICD-10-CM | POA: Diagnosis not present

## 2019-07-25 DIAGNOSIS — E871 Hypo-osmolality and hyponatremia: Secondary | ICD-10-CM | POA: Diagnosis not present

## 2019-07-29 DIAGNOSIS — E871 Hypo-osmolality and hyponatremia: Secondary | ICD-10-CM | POA: Diagnosis not present

## 2019-07-29 DIAGNOSIS — I10 Essential (primary) hypertension: Secondary | ICD-10-CM | POA: Diagnosis not present

## 2019-08-07 ENCOUNTER — Other Ambulatory Visit: Payer: Self-pay

## 2019-08-07 DIAGNOSIS — I10 Essential (primary) hypertension: Secondary | ICD-10-CM

## 2019-08-07 MED ORDER — CARVEDILOL 3.125 MG PO TABS: 3.1250 mg | ORAL_TABLET | Freq: Two times a day (BID) | ORAL | 1 refills | Status: DC

## 2019-08-14 DIAGNOSIS — M4310 Spondylolisthesis, site unspecified: Secondary | ICD-10-CM | POA: Diagnosis not present

## 2019-08-14 DIAGNOSIS — M48061 Spinal stenosis, lumbar region without neurogenic claudication: Secondary | ICD-10-CM | POA: Diagnosis not present

## 2019-08-15 ENCOUNTER — Other Ambulatory Visit: Payer: Self-pay | Admitting: Orthopedic Surgery

## 2019-08-15 DIAGNOSIS — M48061 Spinal stenosis, lumbar region without neurogenic claudication: Secondary | ICD-10-CM

## 2019-09-02 ENCOUNTER — Other Ambulatory Visit: Payer: Self-pay

## 2019-09-02 ENCOUNTER — Ambulatory Visit
Admission: RE | Admit: 2019-09-02 | Discharge: 2019-09-02 | Disposition: A | Discharge status: Home / Self Care | Payer: Medicare PPO | Source: Ambulatory Visit | Attending: Orthopedic Surgery | Admitting: Orthopedic Surgery

## 2019-09-02 DIAGNOSIS — M48061 Spinal stenosis, lumbar region without neurogenic claudication: Secondary | ICD-10-CM | POA: Diagnosis not present

## 2019-09-02 DIAGNOSIS — M545 Low back pain: Secondary | ICD-10-CM | POA: Diagnosis not present

## 2019-09-11 DIAGNOSIS — M5416 Radiculopathy, lumbar region: Secondary | ICD-10-CM | POA: Diagnosis not present

## 2019-09-11 DIAGNOSIS — M48061 Spinal stenosis, lumbar region without neurogenic claudication: Secondary | ICD-10-CM | POA: Diagnosis not present

## 2019-09-15 DIAGNOSIS — M5416 Radiculopathy, lumbar region: Secondary | ICD-10-CM | POA: Diagnosis not present

## 2019-09-19 ENCOUNTER — Other Ambulatory Visit: Payer: Self-pay | Admitting: Internal Medicine

## 2019-09-19 DIAGNOSIS — K219 Gastro-esophageal reflux disease without esophagitis: Secondary | ICD-10-CM

## 2019-09-19 DIAGNOSIS — I10 Essential (primary) hypertension: Secondary | ICD-10-CM

## 2019-09-19 DIAGNOSIS — E785 Hyperlipidemia, unspecified: Secondary | ICD-10-CM

## 2019-09-19 MED ORDER — ESOMEPRAZOLE MAGNESIUM 20 MG PO CPDR: 20.0000 mg | DELAYED_RELEASE_CAPSULE | Freq: Every day | ORAL | 3 refills | Status: DC

## 2019-09-19 MED ORDER — LISINOPRIL 40 MG PO TABS: 40.0000 mg | ORAL_TABLET | Freq: Every day | ORAL | 3 refills | Status: DC

## 2019-09-19 MED ORDER — EZETIMIBE 10 MG PO TABS: 10.0000 mg | ORAL_TABLET | Freq: Every day | ORAL | 3 refills | Status: DC

## 2019-10-02 DIAGNOSIS — Z96652 Presence of left artificial knee joint: Secondary | ICD-10-CM | POA: Diagnosis not present

## 2019-10-14 DIAGNOSIS — E871 Hypo-osmolality and hyponatremia: Secondary | ICD-10-CM | POA: Diagnosis not present

## 2019-10-21 DIAGNOSIS — I10 Essential (primary) hypertension: Secondary | ICD-10-CM | POA: Diagnosis not present

## 2019-10-21 DIAGNOSIS — E871 Hypo-osmolality and hyponatremia: Secondary | ICD-10-CM | POA: Diagnosis not present

## 2019-11-11 ENCOUNTER — Other Ambulatory Visit: Payer: Self-pay

## 2019-11-11 MED ORDER — SPIRONOLACTONE 25 MG PO TABS
25.0000 mg | ORAL_TABLET | Freq: Every day | ORAL | 1 refills | Status: DC
Start: 2019-11-11 — End: 2020-03-22

## 2019-11-14 ENCOUNTER — Ambulatory Visit: Payer: Medicare PPO | Admitting: Internal Medicine

## 2019-11-14 ENCOUNTER — Encounter: Payer: Self-pay | Admitting: Internal Medicine

## 2019-11-14 ENCOUNTER — Other Ambulatory Visit: Payer: Self-pay

## 2019-11-14 VITALS — BP 138/86 | HR 64 | Temp 98.4°F | Ht 59.0 in | Wt 161.2 lb

## 2019-11-14 DIAGNOSIS — E538 Deficiency of other specified B group vitamins: Secondary | ICD-10-CM

## 2019-11-14 DIAGNOSIS — E611 Iron deficiency: Secondary | ICD-10-CM

## 2019-11-14 DIAGNOSIS — Z1231 Encounter for screening mammogram for malignant neoplasm of breast: Secondary | ICD-10-CM | POA: Diagnosis not present

## 2019-11-14 DIAGNOSIS — R5383 Other fatigue: Secondary | ICD-10-CM

## 2019-11-14 DIAGNOSIS — L03031 Cellulitis of right toe: Secondary | ICD-10-CM | POA: Diagnosis not present

## 2019-11-14 DIAGNOSIS — I10 Essential (primary) hypertension: Secondary | ICD-10-CM | POA: Diagnosis not present

## 2019-11-14 LAB — VITAMIN B12: Vitamin B-12: 286 pg/mL (ref 211–911)

## 2019-11-14 MED ORDER — CLONIDINE HCL 0.1 MG PO TABS
0.1000 mg | ORAL_TABLET | Freq: Two times a day (BID) | ORAL | 3 refills | Status: DC
Start: 2019-11-14 — End: 2020-01-27

## 2019-11-14 MED ORDER — CLOTRIMAZOLE 1 % EX OINT: 1.0000 "application " | TOPICAL_OINTMENT | Freq: Two times a day (BID) | CUTANEOUS | 2 refills | Status: DC | PRN

## 2019-11-14 MED ORDER — HYDROCORTISONE 2.5 % EX OINT: TOPICAL_OINTMENT | Freq: Two times a day (BID) | CUTANEOUS | 11 refills | Status: DC

## 2019-11-14 MED ORDER — CLONIDINE HCL 0.1 MG PO TABS
0.1000 mg | ORAL_TABLET | Freq: Two times a day (BID) | ORAL | 3 refills | Status: DC
Start: 2019-11-14 — End: 2019-11-14

## 2019-11-14 MED ORDER — MUPIROCIN 2 % EX OINT: 1.0000 "application " | TOPICAL_OINTMENT | Freq: Two times a day (BID) | CUTANEOUS | 3 refills | Status: DC

## 2019-11-14 NOTE — Patient Instructions (Addendum)
D3 2000 IU daily total max   moderna booster-[facility]    Fatigue If you have fatigue, you feel tired all the time and have a lack of energy or a lack of motivation. Fatigue may make it difficult to start or complete tasks because of exhaustion. In general, occasional or mild fatigue is often a normal response to activity or life. However, long-lasting (chronic) or extreme fatigue may be a symptom of a medical condition. Follow these instructions at home: General instructions  Watch your fatigue for any changes.  Go to bed and get up at the same time every day.  Avoid fatigue by pacing yourself during the day and getting enough sleep at night.  Maintain a healthy weight. Medicines  Take over-the-counter and prescription medicines only as told by your health care provider.  Take a multivitamin, if told by your health care provider.  Do not use herbal or dietary supplements unless they are approved by your health care provider. Activity   Exercise regularly, as told by your health care provider.  Use or practice techniques to help you relax, such as yoga, tai chi, meditation, or massage therapy. Eating and drinking   Avoid heavy meals in the evening.  Eat a well-balanced diet, which includes lean proteins, whole grains, plenty of fruits and vegetables, and low-fat dairy products.  Avoid consuming too much caffeine.  Avoid the use of alcohol.  Drink enough fluid to keep your urine pale yellow. Lifestyle  Change situations that cause you stress. Try to keep your work and personal schedule in balance.  Do not use any products that contain nicotine or tobacco, such as cigarettes and e-cigarettes. If you need help quitting, ask your health care provider.  Do not use drugs. Contact a health care provider if:  Your fatigue does not get better.  You have a fever.  You suddenly lose or gain weight.  You have headaches.  You have trouble falling  asleep or sleeping through the night.  You feel angry, guilty, anxious, or sad.  You are unable to have a bowel movement (constipation).  Your skin is dry.  You have swelling in your legs or another part of your body. Get help right away if:  You feel confused.  Your vision is blurry.  You feel faint or you pass out.  You have a severe headache.  You have severe pain in your abdomen, your back, or the area between your waist and hips (pelvis).  You have chest pain, shortness of breath, or an irregular or fast heartbeat.  You are unable to urinate, or you urinate less than normal.  You have abnormal bleeding, such as bleeding from the rectum, vagina, nose, lungs, or nipples.  You vomit blood.  You have thoughts about hurting yourself or others. If you ever feel like you may hurt yourself or others, or have thoughts about taking your own life, get help right away. You can go to your nearest emergency department or call:  Your local emergency services (911 in the [place]  A suicide crisis helpline, such as the [place] at (917)777-2686. This is open 24 hours a day. Summary  If you have fatigue, you feel tired all the time and have a lack of energy or a lack of motivation.  Fatigue may make it difficult to start or complete tasks because of exhaustion.  Long-lasting (chronic) or extreme fatigue may be a symptom of a medical condition.  Exercise regularly, as told by your  health care provider.  Change situations that cause you stress. Try to keep your work and personal schedule in balance. This information is not intended to replace advice given to you by your health care provider. Make sure you discuss any questions you have with your health care provider. Document Revised: 08/07/2018 Document Reviewed: 10/11/2016 Elsevier Patient Education  2020 [place] is an infection of the skin that surrounds a nail. It  usually affects the skin around a fingernail, but it may also occur near a toenail. It often causes pain and swelling around the nail. In some cases, a collection of pus (abscess) can form near or under the nail.  This condition may develop suddenly, or it may develop gradually over a longer period. In most cases, paronychia is not serious, and it will clear up with treatment. What are the causes? This condition may be caused by bacteria or a fungus. These germs can enter the body through an opening in the skin, such as a cut or a hangnail. What increases the risk? This condition is more likely to develop in people who:  Get their hands wet often, such as those who work as Designer, industrial/product, bartenders, or nurses.  Bite their fingernails or suck their thumbs.  Trim their nails very short.  Have hangnails or injured fingertips.  Get manicures.  Have diabetes. What are the signs or symptoms? Symptoms of this condition include:  Redness and swelling of the skin near the nail.  Tenderness around the nail when you touch the area.  Pus-filled bumps under the skin at the base and sides of the nail (cuticle).  Fluid or pus under the nail.  Throbbing pain in the area. How is this diagnosed? This condition is diagnosed with a physical exam. In some cases, a sample of pus may be tested to determine what type of bacteria or fungus is causing the condition. How is this treated? Treatment depends on the cause and severity of your condition. If your condition is mild, it may clear up on its own in a few days or after soaking in warm water. If needed, treatment may include:  Antibiotic medicine, if your infection is caused by bacteria.  Antifungal medicine, if your infection is caused by a fungus.  A procedure to drain pus from an abscess.  Anti-inflammatory medicine (corticosteroids). Follow these instructions at home: Wound care  Keep the affected area clean.  Soak the affected area in  warm water, if told to do so by your health care provider. You may be told to do this for 20 minutes, 2-3 times a day.  Keep the area dry when you are not soaking it.  Do not try to drain an abscess yourself.  Follow instructions from your health care provider about how to take care of the affected area. Make sure you: ? Wash your hands with soap and water before you change your bandage (dressing). If soap and water are not available, use hand sanitizer. ? Change your dressing as told by your health care provider.  If you had an abscess drained, check the area every day for signs of infection. Check for: ? Redness, swelling, or pain. ? Fluid or blood. ? Warmth. ? Pus or a bad smell. Medicines   Take over-the-counter and prescription medicines only as told by your health care provider.  If you were prescribed an antibiotic medicine, take it as told by your health care provider. Do not stop taking the antibiotic even if  you start to feel better. General instructions  Avoid contact with harsh chemicals.  Do not pick at the affected area. Prevention  To prevent this condition from happening again: ? Wear rubber gloves when washing dishes or doing other tasks that require your hands to get wet. ? Wear gloves if your hands might come in contact with cleaners or other chemicals. ? Avoid injuring your nails or fingertips. ? Do not bite your nails or tear hangnails. ? Do not cut your nails very short. ? Do not cut your cuticles. ? Use clean nail clippers or scissors when trimming nails. Contact a health care provider if:  Your symptoms get worse or do not improve with treatment.  You have continued or increased fluid, blood, or pus coming from the affected area.  Your finger or knuckle becomes swollen or difficult to move. Get help right away if you have:  A fever or chills.  Redness spreading away from the affected area.  Joint or muscle pain. Summary  Paronychia is an  infection of the skin that surrounds a nail. It often causes pain and swelling around the nail. In some cases, a collection of pus (abscess) can form near or under the nail.  This condition may be caused by bacteria or a fungus. These germs can enter the body through an opening in the skin, such as a cut or a hangnail.  If your condition is mild, it may clear up on its own in a few days. If needed, treatment may include medicine or a procedure to drain pus from an abscess.  To prevent this condition from happening again, wear gloves if doing tasks that require your hands to get wet or to come in contact with chemicals. Also avoid injuring your nails or fingertips. This information is not intended to replace advice given to you by your health care provider. Make sure you discuss any questions you have with your health care provider. Document Revised: 02/02/2017 Document Reviewed: 01/29/2017 Elsevier Patient Education  2020 [employer].

## 2019-11-14 NOTE — Progress Notes (Signed)
Chief Complaint  Patient presents with   Follow-up   Toe Pain    right side big toe    F/u  1. HTN on clonidine 0.1 bid, coreg 3.125 mg bid, lis 40 mg qd, spironolactone 25 when went down to 1/2 dose BP 11/10/19 went up to 170s/89 with HR 53 and she had h/a which woke her up at 2:30 in the am  2. Fatigue but waking up Q1 hr to use restroom and also husband unable to sleep due to secretions due to throat cancer and paralysis of vocal cords   Review of Systems  Constitutional: Positive for malaise/fatigue.  HENT: Negative for hearing loss.   Eyes: Negative for blurred vision.  Respiratory: Negative for shortness of breath.   Cardiovascular: Negative for chest pain.  Musculoskeletal: Positive for joint pain.       Right great toe pain around cuticle   Skin: Negative for rash.  Neurological: Positive for headaches.  Psychiatric/Behavioral: Negative for depression.   Past Medical History:  Diagnosis Date   Arthritis    Chronic low back pain    Complication of anesthesia    "All my muscles were sore all over my body was sore the next day.. due to mixture of gases"   Endometrial cancer (HCC)    GERD (gastroesophageal reflux disease)    Heart murmur 2018   tiney murmur, no treatment needed   Hypertension    Hyponatremia    chronic    Osteopenia    Prediabetes    RLS (restless legs syndrome)    Ulcer of the stomach caused by bacteria (H. pylori)    history of   Past Surgical History:  Procedure Laterality Date   ABDOMINAL HYSTERECTOMY  1992   BACK SURGERY  1989   L3, 4 and 5   DILATION AND CURETTAGE OF UTERUS  1992   KNEE ARTHROPLASTY Left 08/09/2016   Procedure: COMPUTER ASSISTED TOTAL KNEE ARTHROPLASTY;  Surgeon: Dereck Leep, MD;  Location: [facility] ORS;  Service: Orthopedics;  Laterality: Left;   KNEE ARTHROSCOPY W/ MENISCAL REPAIR Left    Family History  Problem Relation Age of Onset   Breast cancer Daughter 49   Aneurysm 53        brain  age 77 y.o died 09-08-19   Alzheimer's disease Father    Social History   Socioeconomic History   Marital status: Married    Spouse name: Not on file   Number of children: Not on file   Years of education: Not on file   Highest education level: Not on file  Occupational History   Not on file  Tobacco Use   Smoking status: Never Smoker   Smokeless tobacco: Never Used  Vaping Use   Vaping Use: Never used  Substance and Sexual Activity   Alcohol use: Yes    Comment: a glass of wine 2-3 times a year   Drug use: No   Sexual activity: Not Currently  Other Topics Concern   Not on file  Social History Narrative   Married lives with husband Bernadette Gores who is DPR    03/2019 married 82 years    Social Determinants of Radio broadcast assistant Strain: Low Risk    Difficulty of Paying Living Expenses: Not hard at all  Food Insecurity: No Food Insecurity   Worried About Charity fundraiser in the Last Year: Never true   [place] in the Last Year: Never true  Transportation Needs:  No Transportation Needs   Lack of Transportation (Medical): No   Lack of Transportation (Non-Medical): No  Physical Activity:    Days of Exercise per Week: Not on file   Minutes of Exercise per Session: Not on file  Stress: No Stress Concern Present   Feeling of Stress : Only a little  Social Connections: Unknown   Frequency of Communication with Friends and Family: More than three times a week   Frequency of Social Gatherings with Friends and Family: Not on file   Attends Religious Services: Not on Electrical engineer or Organizations: Not on file   Attends Archivist Meetings: Not on file   Marital Status: Married  Human resources officer Violence: Not At Risk   Fear of Current or Ex-Partner: No   Emotionally Abused: No   Physically Abused: No   Sexually Abused: No   Current Meds  Medication Sig   acetaminophen (TYLENOL) 650 MG CR tablet  Take 1,300 mg by mouth every 8 (eight) hours as needed for pain.   Calcium Carbonate Antacid (TUMS PO) Take by mouth.   Calcium Carbonate-Vitamin D3 (CALCIUM 600-D) 600-400 MG-UNIT TABS Take 2 tablets by mouth daily.    calcium elemental as carbonate (TUMS ULTRA 1000) 400 MG chewable tablet Chew 1,000 mg by mouth as needed for heartburn.   carvedilol (COREG) 3.125 MG tablet Take 1 tablet (3.125 mg total) by mouth 2 (two) times daily with a meal.   cloNIDine (CATAPRES) 0.1 MG tablet Take 1 tablet (0.1 mg total) by mouth in the morning and at bedtime.   esomeprazole (NEXIUM) 20 MG capsule Take 1 capsule (20 mg total) by mouth daily at 12 noon.   ezetimibe (ZETIA) 10 MG tablet Take 1 tablet (10 mg total) by mouth daily.   lisinopril (ZESTRIL) 40 MG tablet Take 1 tablet (40 mg total) by mouth daily.   meloxicam (MOBIC) 15 MG tablet Take 15 mg by mouth daily.   sodium chloride (OCEAN) 0.65 % SOLN nasal spray Place 2 sprays into both nostrils as needed for congestion.   spironolactone (ALDACTONE) 25 MG tablet Take 1 tablet (25 mg total) by mouth daily. 8 am   triamcinolone cream (KENALOG) 0.1 % Apply 1 application topically 2 (two) times daily. Arm prn x 7-14 days   VITAMIN D PO Take 2,000 Units by mouth daily.    [DISCONTINUED] cloNIDine (CATAPRES) 0.1 MG tablet Take 0.1 mg by mouth in the morning and at bedtime.   [DISCONTINUED] cloNIDine (CATAPRES) 0.1 MG tablet Take 1 tablet (0.1 mg total) by mouth in the morning and at bedtime.   Allergies  Allergen Reactions   Amlodipine Swelling   Hydralazine     Rash   Hydralazine Hcl     Rash    Norvasc [Amlodipine Besylate]     5 mg leg swelling     Epinephrine Anxiety    Hyperactivity   Ibuprofen Other (See Comments)    Cannot take due to GI ulcer   Lovastatin Diarrhea, Nausea Only and Nausea And Vomiting   Morphine Nausea Only   Tramadol Nausea Only    Caused nausea, "felt awful all over", and med did not help with  pain "Felt awful all over", and med did not help with pain   Zocor [Simvastatin] Diarrhea, Nausea And Vomiting and Nausea Only    Nausea and vomiting     No results found for this or any previous visit (from the past 2160 hour(s)). Objective  Body  mass index is 32.56 kg/m. Wt Readings from Last 3 Encounters:  11/14/19 161 lb 3.2 oz (73.1 kg)  03/28/19 164 lb 3.2 oz (74.5 kg)  03/20/19 160 lb (72.6 kg)   Temp Readings from Last 3 Encounters:  11/14/19 98.4 F (36.9 C) (Oral)  03/28/19 (!) 97 F (36.1 C) (Temporal)  09/12/18 (!) 97.1 F (36.2 C) (Oral)   BP Readings from Last 3 Encounters:  11/14/19 138/86  03/28/19 (!) 142/82  12/19/18 119/66   Pulse Readings from Last 3 Encounters:  11/14/19 64  03/28/19 64  12/19/18 69    Physical Exam Vitals and nursing note reviewed.  Constitutional:      Appearance: Normal appearance. She is well-developed and well-groomed. She is obese.  HENT:     Head: Normocephalic and atraumatic.  Eyes:     Conjunctiva/sclera: Conjunctivae normal.     Pupils: Pupils are equal, round, and reactive to light.  Cardiovascular:     Rate and Rhythm: Normal rate and regular rhythm.     Heart sounds: Normal heart sounds. No murmur heard.   Pulmonary:     Effort: Pulmonary effort is normal.     Breath sounds: Normal breath sounds.  Abdominal:     General: Bowel sounds are normal.     Tenderness: There is no abdominal tenderness.  Skin:    General: Skin is warm and dry.  Neurological:     General: No focal deficit present.     Mental Status: She is alert and oriented to person, place, and time. Mental status is at baseline.     Gait: Gait normal.  Psychiatric:        Attention and Perception: Attention and perception normal.        Mood and Affect: Mood and affect normal.        Speech: Speech normal.        Behavior: Behavior normal. Behavior is cooperative.        Thought Content: Thought content normal.        Cognition and Memory:  Cognition and memory normal.        Judgment: Judgment normal.     Assessment  Plan  Hypertension, unspecified type BP controlled on lis 40, coreg 3.125 bid monitor HR, clonidine 0.1 bid, spironolactone 25 mg qd  F/u Dr. Candiss Norse as well  Restricted 32 oz water daily due to hypona Monitor K   Paronychia of great toe, right - Plan: Clotrimazole 1 % OINT, hydrocortisone 2.5 % ointment, mupirocin ointment (BACTROBAN) 2   Fatigue, unspecified type likely due to lack of sleep check other factors- Plan: Iron, TIBC and Ferritin Panel, B12   HM Flu shot utd  Tdap had 10/18/10  Consider shingrixGiven Rxhas note hadhad zostervax  prevnar x 1 today pna 23 due 2021 consider at f/u  covid 2/2 moderna 2nd dose due 04/23/19   Osteopenia 12/31/14 dexaconsider repeat in future ordered  Mammogram9/15/2020 negativeordered Colonoscopydr. Tiffany Kocher h/o polyps and mom FH colon cancer  -does not qualifycologuard -Obtained [facility] to see if hemorrhoids Dr Tiffany Kocher 08/20/91 colonoscopy polyp sigmoid colon and IH with tubular adenoma path  08/20/91 EGD gastritis bx active chronic gastritis neg H pylori  09/14/94 colonoscopy normal except IH  Colonoscopy 01/20/00 polyp/colitis adenomatous polyp  03/26/03 colonoscopy h/o polyps 3 sessile polyps melanotic mucosa ileocecal valve path adenomatous and melanosis coli  EGD 12/21/06 12 mm sessile polyp gastric antrum normal esophagus path moderate inactive chronic gastritis neg H pylori  09/07/06 colonoscopy IH,  sessile polyp cecum 3 mm IH, small +, diverticulosis +tubular adenoma  EGD 09/07/06 normal esophagus gastric ulcers with clean base, gastric polyp, gastritis path chronic inflammation chronic gastritis   06/29/09 EGD HH, gastritis superficial vascular congestion neg H pylori   12/01/11 stomach bx antral mucosa with foveolar hyperplasia and mild superficial vascular congestion neg bx   11/30/11 colonoscopy IH small otherwise normal  egd 11/30/11 normal  enlarged gastric folds neg bx   Skinnormalpreviously  Kidney Dr. Wallace Keller appt 12/30/2018  Dr. Fletcher Anon cards [facility] podiatry Dr. Elvina Mattes retired 10/2019   Of note husband has tongue cancer has G tube, trach, tongue graft as of 12/19/2018  [facility]  Provider: Dr. Olivia Mackie McLean-Scocuzza-Internal Medicine

## 2019-11-15 LAB — IRON,TIBC AND FERRITIN PANEL
%SAT: 14 % (calc) — ABNORMAL LOW (ref 16–45)
Ferritin: 27 ng/mL (ref 16–288)
Iron: 52 ug/dL (ref 45–160)
TIBC: 365 mcg/dL (calc) (ref 250–450)

## 2019-11-20 ENCOUNTER — Telehealth: Payer: Self-pay | Admitting: Internal Medicine

## 2019-11-20 NOTE — Telephone Encounter (Signed)
Faxed to Dr.Singh (renal) noted faxed on 11-19-19

## 2019-12-16 DIAGNOSIS — E871 Hypo-osmolality and hyponatremia: Secondary | ICD-10-CM | POA: Diagnosis not present

## 2019-12-16 DIAGNOSIS — I10 Essential (primary) hypertension: Secondary | ICD-10-CM | POA: Diagnosis not present

## 2019-12-23 DIAGNOSIS — E871 Hypo-osmolality and hyponatremia: Secondary | ICD-10-CM | POA: Diagnosis not present

## 2019-12-23 DIAGNOSIS — I10 Essential (primary) hypertension: Secondary | ICD-10-CM | POA: Diagnosis not present

## 2020-01-01 ENCOUNTER — Encounter: Payer: Self-pay | Admitting: Internal Medicine

## 2020-01-21 DIAGNOSIS — B351 Tinea unguium: Secondary | ICD-10-CM | POA: Diagnosis not present

## 2020-01-21 DIAGNOSIS — L600 Ingrowing nail: Secondary | ICD-10-CM | POA: Diagnosis not present

## 2020-01-21 DIAGNOSIS — M79674 Pain in right toe(s): Secondary | ICD-10-CM | POA: Diagnosis not present

## 2020-01-27 ENCOUNTER — Telehealth: Payer: Self-pay

## 2020-01-27 MED ORDER — CLONIDINE HCL 0.1 MG PO TABS
0.1000 mg | ORAL_TABLET | Freq: Two times a day (BID) | ORAL | 3 refills | Status: DC
Start: 2020-01-27 — End: 2020-03-22

## 2020-01-27 NOTE — Telephone Encounter (Signed)
Pt said she needs clonidine resent to [facility]. She said they are telling her prescription is discontinued.

## 2020-02-12 ENCOUNTER — Other Ambulatory Visit: Payer: Self-pay

## 2020-02-12 DIAGNOSIS — I10 Essential (primary) hypertension: Secondary | ICD-10-CM

## 2020-02-12 MED ORDER — CARVEDILOL 3.125 MG PO TABS: 3.1250 mg | ORAL_TABLET | Freq: Two times a day (BID) | ORAL | 1 refills | Status: DC

## 2020-03-18 ENCOUNTER — Other Ambulatory Visit: Payer: Self-pay

## 2020-03-18 ENCOUNTER — Ambulatory Visit: Payer: Medicare PPO | Admitting: Internal Medicine

## 2020-03-18 ENCOUNTER — Encounter: Payer: Self-pay | Admitting: Internal Medicine

## 2020-03-18 VITALS — BP 146/82 | HR 73 | Temp 97.9°F | Ht 59.0 in | Wt 163.2 lb

## 2020-03-18 DIAGNOSIS — Z23 Encounter for immunization: Secondary | ICD-10-CM

## 2020-03-18 DIAGNOSIS — I10 Essential (primary) hypertension: Secondary | ICD-10-CM | POA: Diagnosis not present

## 2020-03-18 DIAGNOSIS — L821 Other seborrheic keratosis: Secondary | ICD-10-CM

## 2020-03-18 DIAGNOSIS — Z1283 Encounter for screening for malignant neoplasm of skin: Secondary | ICD-10-CM | POA: Diagnosis not present

## 2020-03-18 NOTE — Patient Instructions (Addendum)
Pneumococcal Polysaccharide Vaccine (PPSV23): What You Need to Know 1. Why get vaccinated? Pneumococcal polysaccharide vaccine (PPSV23) can prevent pneumococcal disease. Pneumococcal disease refers to any illness caused by pneumococcal bacteria. These bacteria can cause many types of illnesses, including pneumonia, which is an infection of the lungs. Pneumococcal bacteria are one of the most common causes of pneumonia. Besides pneumonia, pneumococcal bacteria can also cause:  Ear infections  Sinus infections  Meningitis (infection of the tissue covering the brain and spinal cord)  Bacteremia (bloodstream infection) Anyone can get pneumococcal disease, but children under 2 years of age, people with certain medical conditions, adults 65 years or older, and cigarette smokers are at the highest risk. Most pneumococcal infections are mild. However, some can result in long-term problems, such as brain damage or hearing loss. Meningitis, bacteremia, and pneumonia caused by pneumococcal disease can be fatal. 2. PPSV23 PPSV23 protects against 23 types of bacteria that cause pneumococcal disease. PPSV23 is recommended for:  All adults 65 years or older,  Anyone 2 years or older with certain medical conditions that can lead to an increased risk for pneumococcal disease. Most people need only one dose of PPSV23. A second dose of PPSV23, and another type of pneumococcal vaccine called PCV13, are recommended for certain high-risk groups. Your health care provider can give you more information. People 65 years or older should get a dose of PPSV23 even if they have already gotten one or more doses of the vaccine before they turned 65. 3. Talk with your health care provider Tell your vaccine provider if the person getting the vaccine:  Has had an allergic reaction after a previous dose of PPSV23, or has any severe, life-threatening allergies. In some cases, your health care provider may decide to postpone  PPSV23 vaccination to a future visit. People with minor illnesses, such as a cold, may be vaccinated. People who are moderately or severely ill should usually wait until they recover before getting PPSV23. Your health care provider can give you more information. 4. Risks of a vaccine reaction  Redness or pain where the shot is given, feeling tired, fever, or muscle aches can happen after PPSV23. People sometimes faint after medical procedures, including vaccination. Tell your provider if you feel dizzy or have vision changes or ringing in the ears. As with any medicine, there is a very remote chance of a vaccine causing a severe allergic reaction, other serious injury, or death. 5. What if there is a serious problem? An allergic reaction could occur after the vaccinated person leaves the clinic. If you see signs of a severe allergic reaction (hives, swelling of the face and throat, difficulty breathing, a fast heartbeat, dizziness, or weakness), call 9-1-1 and get the person to the nearest hospital. For other signs that concern you, call your health care provider. Adverse reactions should be reported to the Vaccine Adverse Event Reporting System (VAERS). Your health care provider will usually file this report, or you can do it yourself. Visit the VAERS website at www.vaers.hhs.gov or call 1-800-822-7967. VAERS is only for reporting reactions, and VAERS staff do not give medical advice. 6. How can I learn more?  Ask your health care provider.  Call your local or state health department.  Contact the Centers for Disease Control and Prevention (CDC): ? Call 1-800-232-4636 (1-800-CDC-INFO) or ? Visit CDC's website at www.cdc.gov/vaccines Vaccine Information Statement PPSV23 Vaccine (11/28/2017) This information is not intended to replace advice given to you by your health care provider. Make sure you discuss   any questions you have with your health care provider. Document Revised: 09/19/2019  Document Reviewed: 09/19/2019 Elsevier Patient Education  [employer].  Seborrheic Keratosis A seborrheic keratosis is a common, noncancerous (benign) skin growth. These growths are velvety, waxy, rough, tan, brown, or black spots that appear on the skin. These skin growths can be flat or raised, and scaly. What are the causes? The cause of this condition is not known. What increases the risk? You are more likely to develop this condition if you:  Have a family history of seborrheic keratosis.  Are 50 or older.  Are pregnant.  Have had estrogen replacement therapy. What are the signs or symptoms? Symptoms of this condition include growths on the face, chest, shoulders, back, or other areas. These growths:  Are usually painless, but may become irritated and itchy.  Can be yellow, brown, black, or other colors.  Are slightly raised or have a flat surface.  Are sometimes rough or wart-like in texture.  Are often velvety or waxy on the surface.  Are round or oval-shaped.  Often occur in groups, but may occur as a single growth.   How is this diagnosed? This condition is diagnosed with a medical history and physical exam.  A sample of the growth may be tested (skin biopsy).  You may need to see a skin specialist (dermatologist). How is this treated? Treatment is not usually needed for this condition, unless the growths are irritated or bleed often.  You may also choose to have the growths removed if you do not like their appearance. ? Most commonly, these growths are treated with a procedure in which liquid nitrogen is applied to "freeze" off the growth (cryosurgery). ? They may also be burned off with electricity (electrocautery) or removed by scraping (curettage). Follow these instructions at home:  Watch your growth for any changes.  Keep all follow-up visits as told by your health care provider. This is important.  Do not scratch or pick at the growth or  growths. This can cause them to become irritated or infected. Contact a health care provider if:  You suddenly have many new growths.  Your growth bleeds, itches, or hurts.  Your growth suddenly becomes larger or changes color. Summary  A seborrheic keratosis is a common, noncancerous (benign) skin growth.  Treatment is not usually needed for this condition, unless the growths are irritated or bleed often.  Watch your growth for any changes.  Contact a health care provider if you suddenly have many new growths or your growth suddenly becomes larger or changes color.  Keep all follow-up visits as told by your health care provider. This is important. This information is not intended to replace advice given to you by your health care provider. Make sure you discuss any questions you have with your health care provider. Document Revised: 05/31/2017 Document Reviewed: 05/31/2017 Elsevier Patient Education  2021 [employer].

## 2020-03-18 NOTE — Progress Notes (Signed)
Chief Complaint  Patient presents with  . Skin Problem    Raised area on scalp, itchy, looks as skin is crusted/dry. Pt says it has become darker with time.     F/u  1. Lesion left post scalp increasing in size and black more crusty than normal and other rough lesions in scalp and one to right forehead  Refer Dr. Evorn Gong  2. HTN uncontrolled at home sbp 130-145 on coreg 3.125 mg bid clonidine 0.1 bid and lis 40 mg qd and spironolactone 25 mg qd  3. Husband died 2020/02/14 would have been married 20 years April 13, 2020 and grandson died in 14-Sep-2019 GAD 7 score 1 and PHQ 9 score 4    Review of Systems  Constitutional: Negative for weight loss.  HENT: Negative for hearing loss.   Eyes: Negative for blurred vision.  Respiratory: Negative for shortness of breath.   Cardiovascular: Negative for chest pain.  Gastrointestinal: Negative for abdominal pain.  Musculoskeletal: Negative for falls and joint pain.  Skin: Negative for rash.  Neurological: Negative for headaches.  Psychiatric/Behavioral: Negative for depression.   Past Medical History:  Diagnosis Date  . Arthritis   . Chronic low back pain   . Complication of anesthesia    "All my muscles were sore all over my body was sore the next day.. due to mixture of gases"  . Endometrial cancer ([place])   . GERD (gastroesophageal reflux disease)   . Heart murmur 2018   tiney murmur, no treatment needed  . Hypertension   . Hyponatremia    chronic   . Osteopenia   . Prediabetes   . RLS (restless legs syndrome)   . Ulcer of the stomach caused by bacteria (H. pylori)    history of   Past Surgical History:  Procedure Laterality Date  . ABDOMINAL HYSTERECTOMY  1992  . BACK SURGERY  1989   L3, 4 and 5  . DILATION AND CURETTAGE OF UTERUS  1992  . KNEE ARTHROPLASTY Left 08/09/2016   Procedure: COMPUTER ASSISTED TOTAL KNEE ARTHROPLASTY;  Surgeon: Dereck Leep, MD;  Location: [facility] ORS;  Service: Orthopedics;  Laterality: Left;  . KNEE ARTHROSCOPY  W/ MENISCAL REPAIR Left    Family History  Problem Relation Age of Onset  . Breast cancer Daughter 49  . Aneurysm Grandson        brain age 59 y.o died September 14, 2019  . Alzheimer's disease Father    Social History   Socioeconomic History  . Marital status: Widowed    Spouse name: Not on file  . Number of children: Not on file  . Years of education: Not on file  . Highest education level: Not on file  Occupational History  . Not on file  Tobacco Use  . Smoking status: Never Smoker  . Smokeless tobacco: Never Used  Vaping Use  . Vaping Use: Never used  Substance and Sexual Activity  . Alcohol use: Yes    Comment: a glass of wine 2-3 times a year  . Drug use: No  . Sexual activity: Not Currently  Other Topics Concern  . Not on file  Social History Narrative   Married lives with husband Xuan Mateus who is DPR    Husband died 02-14-20   04/17/2019 married 73 years    Social Determinants of Health   Financial Resource Strain: [place]   . Difficulty of Paying Living Expenses: Not hard at all  Food Insecurity: No Food Insecurity  . Worried About Estate manager/land agent  of Food in the Last Year: Never true  . Ran Out of Food in the Last Year: Never true  Transportation Needs: No Transportation Needs  . Lack of Transportation (Medical): No  . Lack of Transportation (Non-Medical): No  Physical Activity: Not on file  Stress: No Stress Concern Present  . Feeling of Stress : Only a little  Social Connections: Unknown  . Frequency of Communication with Friends and Family: More than three times a week  . Frequency of Social Gatherings with Friends and Family: Not on file  . Attends Religious Services: Not on file  . Active Member of Clubs or Organizations: Not on file  . Attends Archivist Meetings: Not on file  . Marital Status: Married  Human resources officer Violence: Not At Risk  . Fear of Current or Ex-Partner: No  . Emotionally Abused: No  . Physically Abused: No  . Sexually  Abused: No   Current Meds  Medication Sig  . acetaminophen (TYLENOL) 650 MG CR tablet Take 1,300 mg by mouth every 8 (eight) hours as needed for pain.  . Calcium Carbonate Antacid (TUMS PO) Take by mouth.  . Calcium Carbonate-Vitamin D3 600-400 MG-UNIT TABS Take 2 tablets by mouth daily.   . calcium elemental as carbonate (BARIATRIC TUMS ULTRA) 400 MG chewable tablet Chew 1,000 mg by mouth as needed for heartburn.  . carvedilol (COREG) 3.125 MG tablet Take 1 tablet (3.125 mg total) by mouth 2 (two) times daily with a meal.  . cloNIDine (CATAPRES) 0.1 MG tablet Take 1 tablet (0.1 mg total) by mouth in the morning and at bedtime.  . Clotrimazole 1 % OINT Apply 1 application topically 2 (two) times daily as needed.  Marland Kitchen esomeprazole (NEXIUM) 20 MG capsule Take 1 capsule (20 mg total) by mouth daily at 12 noon.  . ezetimibe (ZETIA) 10 MG tablet Take 1 tablet (10 mg total) by mouth daily.  . hydrocortisone 2.5 % ointment Apply topically 2 (two) times daily. Right great toe prn  . lisinopril (ZESTRIL) 40 MG tablet Take 1 tablet (40 mg total) by mouth daily.  . meloxicam (MOBIC) 15 MG tablet Take 15 mg by mouth daily.  . mupirocin ointment (BACTROBAN) 2 % Apply 1 application topically 2 (two) times daily. Prn right great toe  . sodium chloride (OCEAN) 0.65 % SOLN nasal spray Place 2 sprays into both nostrils as needed for congestion.  Marland Kitchen spironolactone (ALDACTONE) 25 MG tablet Take 1 tablet (25 mg total) by mouth daily. 8 am  . triamcinolone cream (KENALOG) 0.1 % Apply 1 application topically 2 (two) times daily. Arm prn x 7-14 days  . VITAMIN D PO Take 2,000 Units by mouth daily.    Allergies  Allergen Reactions  . Amlodipine Swelling  . Hydralazine     Rash  . Hydralazine Hcl     Rash   . Norvasc [Amlodipine Besylate]     5 mg leg swelling    . Epinephrine Anxiety    Hyperactivity  . Ibuprofen Other (See Comments)    Cannot take due to GI ulcer  . Lovastatin Diarrhea, Nausea Only and  Nausea And Vomiting  . Morphine Nausea Only  . Tramadol Nausea Only    Caused nausea, "felt awful all over", and med did not help with pain "Felt awful all over", and med did not help with pain  . Zocor [Simvastatin] Diarrhea, Nausea And Vomiting and Nausea Only    Nausea and vomiting     No results found for  this or any previous visit (from the past 2160 hour(s)). Objective  Body mass index is 32.97 kg/m. Wt Readings from Last 3 Encounters:  03/18/20 163 lb 4 oz (74 kg)  11/14/19 161 lb 3.2 oz (73.1 kg)  03/28/19 164 lb 3.2 oz (74.5 kg)   Temp Readings from Last 3 Encounters:  03/18/20 97.9 F (36.6 C)  11/14/19 98.4 F (36.9 C) (Oral)  03/28/19 (!) 97 F (36.1 C) (Temporal)   BP Readings from Last 3 Encounters:  03/18/20 (!) 170/82  11/14/19 138/86  03/28/19 (!) 142/82   Pulse Readings from Last 3 Encounters:  03/18/20 73  11/14/19 64  03/28/19 64    Physical Exam Vitals and nursing note reviewed.  Constitutional:      Appearance: Normal appearance. She is well-developed and well-groomed. She is obese.  HENT:     Head: Normocephalic and atraumatic.  Eyes:     Conjunctiva/sclera: Conjunctivae normal.     Pupils: Pupils are equal, round, and reactive to light.  Cardiovascular:     Rate and Rhythm: Normal rate and regular rhythm.     Heart sounds: Normal heart sounds. No murmur heard.   Pulmonary:     Effort: Pulmonary effort is normal.     Breath sounds: Normal breath sounds.  Abdominal:     Tenderness: There is no abdominal tenderness.  Skin:    General: Skin is warm and dry.  Neurological:     General: No focal deficit present.     Mental Status: She is alert and oriented to person, place, and time. Mental status is at baseline.     Gait: Gait normal.  Psychiatric:        Attention and Perception: Attention and perception normal.        Mood and Affect: Mood and affect normal.        Speech: Speech normal.        Behavior: Behavior normal. Behavior  is cooperative.        Thought Content: Thought content normal.        Cognition and Memory: Cognition and memory normal.        Judgment: Judgment normal.     Assessment  Plan  Hypertension, unspecified type coreg 3.125 mg bid clonidine 0.1 bid and lis 40 mg qd and spironolactone 25 mg qd  Will see if cards/Dr. Candiss Norse agreeable to increase to coreg 6.25 mg bid and let pt know   Seborrheic keratosis - Plan: Ambulatory referral to Dermatology Skin cancer screening - Plan: Ambulatory referral to Dermatology   HM Flu shot utd  Tdap had 10/18/10  Consider shingrixGiven Rxhas note hadhad zostervax  prevnar x 1 utd and pna 23 vx today covid 2/2 moderna had 01/12/20   Osteopenia 12/31/14 dexaconsider repeat in futureordered 04/03/19 dexa osteopenia  Mammogram9/15/2020 negativeordered pt needs to schedule   Colonoscopydr. Tiffany Kocher h/o polyps and mom FH colon cancer  -does not qualifycologuard -[facility]sto see if hemorrhoidsDr Tiffany Kocher 08/20/91 colonoscopy polyp sigmoid colon and IH with tubular adenoma path  08/20/91 EGD gastritis bx active chronic gastritis neg H pylori  09/14/94 colonoscopy normal except IH  Colonoscopy 01/20/00 polyp/colitis adenomatous polyp  03/26/03 colonoscopy h/o polyps 3 sessile polyps melanotic mucosa ileocecal valve path adenomatous and melanosis coli  EGD 12/21/06 12 mm sessile polyp gastric antrum normal esophagus path moderate inactive chronic gastritis neg H pylori  09/07/06 colonoscopy IH, sessile polyp cecum 3 mm IH, small +, diverticulosis +tubular adenoma  EGD 09/07/06 normal esophagus gastric  ulcers with clean base, gastric polyp, gastritis path chronic inflammation chronic gastritis   06/29/09 EGD HH, gastritis superficial vascular congestion neg H pylori  12/01/11 stomach bx antral mucosa with foveolar hyperplasia and mild superficial vascular congestion neg bx   11/30/11 colonoscopy IH small otherwise normal  egd 11/30/11 normal  enlarged gastric folds neg bx  Skinnormalsee above referred Dr.Dasher  Kidney Dr. Wallace Keller appt 12/30/2018  Dr. Fletcher Anon cards [facility] podiatry Dr. Elvina Mattes retired 10/2019   [place] long term  Provider: Dr. Olivia Mackie McLean-Scocuzza-Internal Medicine

## 2020-03-18 NOTE — Addendum Note (Signed)
Addended by: Thressa Sheller on: 03/18/2020 01:46 PM   Modules accepted: Orders

## 2020-03-19 ENCOUNTER — Telehealth: Payer: Self-pay | Admitting: Internal Medicine

## 2020-03-19 NOTE — Telephone Encounter (Signed)
Hypertension on  coreg 3.125 mg bid clonidine 0.1 bid and lisinopril 40 mg qd and spironolactone 25 mg qd  Dr Candiss Norse was ok with BP top # systolic being 051T  If higher we can consider increase coreg to 6.25 mg 1x per day let me know please

## 2020-03-22 ENCOUNTER — Other Ambulatory Visit: Payer: Self-pay | Admitting: Internal Medicine

## 2020-03-22 ENCOUNTER — Ambulatory Visit (INDEPENDENT_AMBULATORY_CARE_PROVIDER_SITE_OTHER): Payer: Medicare PPO

## 2020-03-22 VITALS — Ht 59.0 in | Wt 163.0 lb

## 2020-03-22 DIAGNOSIS — Z0000 Encounter for general adult medical examination without abnormal findings: Secondary | ICD-10-CM | POA: Diagnosis not present

## 2020-03-22 DIAGNOSIS — I10 Essential (primary) hypertension: Secondary | ICD-10-CM

## 2020-03-22 MED ORDER — SPIRONOLACTONE 25 MG PO TABS
25.0000 mg | ORAL_TABLET | Freq: Every day | ORAL | 3 refills | Status: DC
Start: 2020-03-22 — End: 2020-07-29

## 2020-03-22 MED ORDER — LISINOPRIL 40 MG PO TABS: 40.0000 mg | ORAL_TABLET | Freq: Every day | ORAL | 3 refills | Status: DC

## 2020-03-22 MED ORDER — CLONIDINE HCL 0.1 MG PO TABS
0.1000 mg | ORAL_TABLET | Freq: Two times a day (BID) | ORAL | 3 refills | Status: DC
Start: 2020-03-22 — End: 2021-01-10

## 2020-03-22 NOTE — Progress Notes (Addendum)
Subjective:   EMYA PICADO is a 87 y.o. female who presents for Medicare Annual (Subsequent) preventive examination.  Review of Systems    No ROS.  Medicare Wellness Virtual Visit.   Cardiac Risk Factors include: advanced age (>10men, >59 women);hypertension     Objective:    Today's Vitals   03/22/20 0936  Weight: 163 lb (73.9 kg)  Height: 4\' 11"  (1.499 m)   Body mass index is 32.92 kg/m.  Advanced Directives 03/22/2020 03/20/2019 05/06/2018 08/09/2016 08/09/2016 07/26/2016  Does Patient Have a Medical Advance Directive? Yes Yes No No Yes Yes  Type of Paramedic of [place];Living will [place];Living will - [place];Living will [place];Living will Living will;Healthcare Power of Attorney  Does patient want to make changes to medical advance directive? No - Patient declined No - Patient declined - No - Patient declined - No - Patient declined  Copy of [place] in Chart? Yes - validated most recent copy scanned in chart (See row information) Yes - validated most recent copy scanned in chart (See row information) - Yes Yes No - copy requested  Would patient like information on creating a medical advance directive? - - No - Patient declined No - Patient declined - -    Current Medications (verified) Outpatient Encounter Medications as of 03/22/2020  Medication Sig  . acetaminophen (TYLENOL) 650 MG CR tablet Take 1,300 mg by mouth every 8 (eight) hours as needed for pain.  . Calcium Carbonate Antacid (TUMS PO) Take by mouth.  . Calcium Carbonate-Vitamin D3 600-400 MG-UNIT TABS Take 2 tablets by mouth daily.   . calcium elemental as carbonate (BARIATRIC TUMS ULTRA) 400 MG chewable tablet Chew 1,000 mg by mouth as needed for heartburn.  . carvedilol (COREG) 3.125 MG tablet Take 1 tablet (3.125 mg total) by mouth 2 (two) times daily with a meal.  . Clotrimazole 1 % OINT Apply 1  application topically 2 (two) times daily as needed.  Marland Kitchen esomeprazole (NEXIUM) 20 MG capsule Take 1 capsule (20 mg total) by mouth daily at 12 noon.  . ezetimibe (ZETIA) 10 MG tablet Take 1 tablet (10 mg total) by mouth daily.  . hydrocortisone 2.5 % ointment Apply topically 2 (two) times daily. Right great toe prn  . meloxicam (MOBIC) 15 MG tablet Take 15 mg by mouth daily.  . mupirocin ointment (BACTROBAN) 2 % Apply 1 application topically 2 (two) times daily. Prn right great toe  . sodium chloride (OCEAN) 0.65 % SOLN nasal spray Place 2 sprays into both nostrils as needed for congestion.  . triamcinolone cream (KENALOG) 0.1 % Apply 1 application topically 2 (two) times daily. Arm prn x 7-14 days  . VITAMIN D PO Take 2,000 Units by mouth daily.   . [DISCONTINUED] cloNIDine (CATAPRES) 0.1 MG tablet Take 1 tablet (0.1 mg total) by mouth in the morning and at bedtime.  . [DISCONTINUED] lisinopril (ZESTRIL) 40 MG tablet Take 1 tablet (40 mg total) by mouth daily.  . [DISCONTINUED] spironolactone (ALDACTONE) 25 MG tablet Take 1 tablet (25 mg total) by mouth daily. 8 am   No facility-administered encounter medications on file as of 03/22/2020.    Allergies (verified) Amlodipine, Hydralazine, Hydralazine hcl, Norvasc [amlodipine besylate], Epinephrine, Ibuprofen, Lovastatin, Morphine, Tramadol, and Zocor [simvastatin]   History: Past Medical History:  Diagnosis Date  . Arthritis   . Chronic low back pain   . Complication of anesthesia    "All  my muscles were sore all over my body was sore the next day.. due to mixture of gases"  . Endometrial cancer ([place])   . GERD (gastroesophageal reflux disease)   . Heart murmur 2018   tiney murmur, no treatment needed  . Hypertension   . Hyponatremia    chronic   . Osteopenia   . Prediabetes   . RLS (restless legs syndrome)   . Ulcer of the stomach caused by bacteria (H. pylori)    history of   Past Surgical History:  Procedure Laterality Date  .  ABDOMINAL HYSTERECTOMY  1992  . BACK SURGERY  1989   L3, 4 and 5  . DILATION AND CURETTAGE OF UTERUS  1992  . KNEE ARTHROPLASTY Left 08/09/2016   Procedure: COMPUTER ASSISTED TOTAL KNEE ARTHROPLASTY;  Surgeon: Dereck Leep, MD;  Location: [facility] ORS;  Service: Orthopedics;  Laterality: Left;  . KNEE ARTHROSCOPY W/ MENISCAL REPAIR Left    Family History  Problem Relation Age of Onset  . Breast cancer Daughter 71  . Aneurysm Grandson        brain age 66 y.o died 08/23/2019  . Alzheimer's disease Father    Social History   Socioeconomic History  . Marital status: Widowed    Spouse name: Not on file  . Number of children: Not on file  . Years of education: Not on file  . Highest education level: Not on file  Occupational History  . Not on file  Tobacco Use  . Smoking status: Never Smoker  . Smokeless tobacco: Never Used  Vaping Use  . Vaping Use: Never used  Substance and Sexual Activity  . Alcohol use: Yes    Comment: a glass of wine 2-3 times a year  . Drug use: No  . Sexual activity: Not Currently  Other Topics Concern  . Not on file  Social History Narrative   Married lives with husband Noriko Macari who is DPR    Husband died January 23, 2020   2019/03/26 married 80 years    Social Determinants of Health   Financial Resource Strain: [place]   . Difficulty of Paying Living Expenses: Not hard at all  Food Insecurity: No Food Insecurity  . Worried About Charity fundraiser in the Last Year: Never true  . Ran Out of Food in the Last Year: Never true  Transportation Needs: No Transportation Needs  . Lack of Transportation (Medical): No  . Lack of Transportation (Non-Medical): No  Physical Activity: Not on file  Stress: No Stress Concern Present  . Feeling of Stress : Only a little  Social Connections: Unknown  . Frequency of Communication with Friends and Family: More than three times a week  . Frequency of Social Gatherings with Friends and Family: More than three times a week   . Attends Religious Services: Not on file  . Active Member of Clubs or Organizations: Yes  . Attends Archivist Meetings: 1 to 4 times per year  . Marital Status: Widowed    Tobacco Counseling Counseling given: Not Answered   Clinical Intake:  Pre-visit preparation completed: Yes        Diabetes: No  How often do you need to have someone help you when you read instructions, pamphlets, or other written materials from your doctor or pharmacy?: 1 - Never  Interpreter Needed?: No      Activities of Daily Living In your present state of health, do you have any difficulty performing the following activities:  03/22/2020  Hearing? Y  Vision? N  Difficulty concentrating or making decisions? N  Walking or climbing stairs? N  Dressing or bathing? N  Doing errands, shopping? N  Preparing Food and eating ? N  Using the Toilet? N  In the past six months, have you accidently leaked urine? N  Comment Managed with daily liner. Described more of stress incontinence.  Do you have problems with loss of bowel control? N  Managing your Medications? N  Managing your Finances? N  Housekeeping or managing your Housekeeping? N  Some recent data might be hidden    Patient Care Team: McLean-Scocuzza, Nino Glow, MD as PCP - General (Internal Medicine)  Indicate any recent Medical Services you may have received from other than [facility] providers in the past year (date may be approximate).     Assessment:   This is a routine wellness examination for Tyaira.  I connected with Jennamarie today by telephone and verified that I am speaking with the correct person using two identifiers. Location patient: home Location provider: work Persons participating in the virtual visit: patient, Marine scientist.    I discussed the limitations, risks, security and privacy concerns of performing an evaluation and management service by telephone and the availability of in person appointments. The patient expressed  understanding and verbally consented to this telephonic visit.    Interactive audio and video telecommunications were attempted between this provider and patient, however failed, due to patient having technical difficulties OR patient did not have access to video capability.  We continued and completed visit with audio only.  Some vital signs may be absent or patient reported.   Hearing/Vision screen  Hearing Screening   125Hz  250Hz  500Hz  1000Hz  2000Hz  3000Hz  4000Hz  6000Hz  8000Hz   Right ear:           Left ear:           Comments: Hearing aid, bilateral  Followed by Judy Pimple, [facility]   Dietary issues and exercise activities discussed: Current Exercise Habits: Home exercise routine, Intensity: Mild  Healthy diet; monitors sweet intake Restricted fluid intake, 32 ounces  Goals    . Weight (lb) < 160 lb (72.6 kg)     I want to lose 4lb with portion control, and reducing sugar intake.      Depression Screen PHQ 2/9 Scores 03/22/2020 03/28/2019 03/20/2019 12/19/2018 09/12/2018  PHQ - 2 Score 0 0 0 0 0    Fall Risk Fall Risk  03/22/2020 11/14/2019 03/28/2019 03/20/2019 12/19/2018  Falls in the past year? 0 0 0 0 0  Number falls in past yr: 0 0 0 - -  Injury with Fall? 0 0 0 - -  Follow up Falls evaluation completed Falls evaluation completed Falls evaluation completed Falls evaluation completed -    FALL RISK PREVENTION PERTAINING TO THE HOME: Handrails in use when climbing stairs? Yes Home free of loose throw rugs in walkways, pet beds, electrical cords, etc? Yes  Adequate lighting in your home to reduce risk of falls? Yes   ASSISTIVE DEVICES UTILIZED TO PREVENT FALLS: Life alert? Yes   Elevator in residence? Yes  Use of a cane, walker or w/c? No  Grab bars in the bathroom? Yes  Shower chair or bench in shower? No  Elevated toilet seat or a handicapped toilet? No   TIMED UP AND GO: Was the test performed? No . Virtual visit.   Cognitive Function:  Patient is alert  and oriented x3.  Denies difficulty focusing, making  decisions, memory loss.  Enjoys crossword puzzles, often use of online system, Higher education careers adviser, and reading for brain health.  MMSE/6CIT deferred. Normal by direct communication/observation.    6CIT Screen 03/22/2020 03/20/2019  What Year? 0 points 0 points  What month? 0 points 0 points  What time? 0 points 0 points  Count back from 20 - 0 points  Months in reverse 0 points 0 points  Repeat phrase - 0 points  Total Score - 0    Immunizations Immunization History  Administered Date(s) Administered  . Influenza, High Dose Seasonal PF 11/29/2016, 10/17/2018  . Influenza,inj,Quad PF,6+ Mos 10/29/2017  . Influenza-Unspecified 11/12/2019  . Moderna Sars-Covid-2 Vaccination 03/26/2019, 04/23/2019, 01/12/2020  . Pneumococcal Conjugate-13 09/12/2018  . Pneumococcal Polysaccharide-23 03/18/2020  . Tdap 10/18/2010  . Zoster 05/01/2012    Health Maintenance There are no preventive care reminders to display for this patient. Health Maintenance  Topic Date Due  . COVID-19 Vaccine (4 - Booster for Moderna series) 07/12/2020  . TETANUS/TDAP  10/17/2020  . INFLUENZA VACCINE  Completed  . DEXA SCAN  Completed  . PNA vac Low Risk Adult  Completed   Colorectal cancer screening: No longer required.   Mammogram- plans to schedule  Bone Density status: Completed 03/2019. Results reflect: Bone density results: OSTEOPENIA. Repeat every 2 years. Calcium Carbonate-Vitamin D3 600-400 MG-UNIT TABS. VITAMIN D PO.  Lung Cancer Screening: (Low Dose CT Chest recommended if Age 16-80 years, 30 pack-year currently smoking OR have quit w/in 15years.) does not qualify.   Hepatitis C Screening: does not qualify.  Vision Screening: Recommended annual ophthalmology exams for early detection of glaucoma and other disorders of the eye. Is the patient up to date with their annual eye exam?  Yes   Dental Screening: Recommended annual dental exams for proper oral  hygiene.  Community Resource Referral / Chronic Care Management: CRR required this visit?  No   CCM required this visit?  No      Plan:   Keep all routine maintenance appointments.   Dermatology -scheduled with Dr. Evorn Gong 03/23/20  I have personally reviewed and noted the following in the patient's chart:   . Medical and social history . Use of alcohol, tobacco or illicit drugs  . Current medications and supplements- no opiod use . Functional ability and status . Nutritional status . Physical activity . Advanced directives . List of other physicians . Hospitalizations, surgeries, and ER visits in previous 12 months . Vitals . Screenings to include cognitive, depression, and falls . Referrals and appointments  In addition, I have reviewed and discussed with patient certain preventive protocols, quality metrics, and best practice recommendations. A written personalized care plan for preventive services as well as general preventive health recommendations were provided to patient via mychart.     Varney Biles, LPN   05/17/4079     Agree with plan. Mable Paris, NP

## 2020-03-22 NOTE — Telephone Encounter (Signed)
Patient informed and verbalized understanding.  confirmed medications she is taking.  States she will continue to monitor her BP but the number have been good at home. State she will write these down for 2 weeks and then send in on mychart.   Patient states that the mail order pharmacy gave her a script for one clonidine a day instead of two. Re-sent this electronically to her mail order pharmacy.

## 2020-03-22 NOTE — Patient Instructions (Addendum)
Jill Stanley , Thank you for taking time to come for your Medicare Wellness Visit. I appreciate your ongoing commitment to your health goals. Please review the following plan we discussed and let me know if I can assist you in the future.   These are the goals we discussed: Goals    . Weight (lb) < 160 lb (72.6 kg)     I want to lose 4lb with portion control, and reducing sugar intake.       This is a list of the screening recommended for you and due dates:  Health Maintenance  Topic Date Due  . COVID-19 Vaccine (4 - Booster for Moderna series) 07/12/2020  . Tetanus Vaccine  10/17/2020  . Flu Shot  Completed  . DEXA scan (bone density measurement)  Completed  . Pneumonia vaccines  Completed    Immunizations Immunization History  Administered Date(s) Administered  . Influenza, High Dose Seasonal PF 11/29/2016, 10/17/2018  . Influenza,inj,Quad PF,6+ Mos 10/29/2017  . Influenza-Unspecified 11/12/2019  . Moderna Sars-Covid-2 Vaccination 03/26/2019, 04/23/2019, 01/12/2020  . Pneumococcal Conjugate-13 09/12/2018  . Pneumococcal Polysaccharide-23 03/18/2020  . Tdap 10/18/2010  . Zoster 05/01/2012   Advanced directives: on file.   Conditions/risks identified: none new.  Follow up in one year for your annual wellness visit.   Preventive Care 85 Years and Older, Female Preventive care refers to lifestyle choices and visits with your health care provider that can promote health and wellness. What does preventive care include?  A yearly physical exam. This is also called an annual well check.  Dental exams once or twice a year.  Routine eye exams. Ask your health care provider how often you should have your eyes checked.  Personal lifestyle choices, including:  Daily care of your teeth and gums.  Regular physical activity.  Eating a healthy diet.  Avoiding tobacco and drug use.  Limiting alcohol use.  Practicing safe sex.  Taking low-dose aspirin every  day.  Taking vitamin and mineral supplements as recommended by your health care provider. What happens during an annual well check? The services and screenings done by your health care provider during your annual well check will depend on your age, overall health, lifestyle risk factors, and family history of disease. Counseling  Your health care provider may ask you questions about your:  Alcohol use.  Tobacco use.  Drug use.  Emotional well-being.  Home and relationship well-being.  Sexual activity.  Eating habits.  History of falls.  Memory and ability to understand (cognition).  Work and work Statistician.  Reproductive health. Screening  You may have the following tests or measurements:  Height, weight, and BMI.  Blood pressure.  Lipid and cholesterol levels. These may be checked every 5 years, or more frequently if you are over 85 years old.  Skin check.  Lung cancer screening. You may have this screening every year starting at age 85 if you have a 30-pack-year history of smoking and currently smoke or have quit within the past 15 years.  Fecal occult blood test (FOBT) of the stool. You may have this test every year starting at age 85.  Flexible sigmoidoscopy or colonoscopy. You may have a sigmoidoscopy every 5 years or a colonoscopy every 10 years starting at age 85.  Hepatitis C blood test.  Hepatitis B blood test.  Sexually transmitted disease (STD) testing.  Diabetes screening. This is done by checking your blood sugar (glucose) after you have not eaten for a while (fasting). You may have this  done every 1-3 years.  Bone density scan. This is done to screen for osteoporosis. You may have this done starting at age 85.  Mammogram. This may be done every 1-2 years. Talk to your health care provider about how often you should have regular mammograms. Talk with your health care provider about your test results, treatment options, and if necessary, the need  for more tests. Vaccines  Your health care provider may recommend certain vaccines, such as:  Influenza vaccine. This is recommended every year.  Tetanus, diphtheria, and acellular pertussis (Tdap, Td) vaccine. You may need a Td booster every 10 years.  Zoster vaccine. You may need this after age 26.  Pneumococcal 13-valent conjugate (PCV13) vaccine. One dose is recommended after age 85.  Pneumococcal polysaccharide (PPSV23) vaccine. One dose is recommended after age 85. Talk to your health care provider about which screenings and vaccines you need and how often you need them. This information is not intended to replace advice given to you by your health care provider. Make sure you discuss any questions you have with your health care provider. Document Released: 02/12/2015 Document Revised: 10/06/2015 Document Reviewed: 11/17/2014 Elsevier Interactive Patient Education  2017 [employer] Prevention in the Home Falls can cause injuries. They can happen to people of all ages. There are many things you can do to make your home safe and to help prevent falls. What can I do on the outside of my home?  Regularly fix the edges of walkways and driveways and fix any cracks.  Remove anything that might make you trip as you walk through a door, such as a raised step or threshold.  Trim any bushes or trees on the path to your home.  Use bright outdoor lighting.  Clear any walking paths of anything that might make someone trip, such as rocks or tools.  Regularly check to see if handrails are loose or broken. Make sure that both sides of any steps have handrails.  Any raised decks and porches should have guardrails on the edges.  Have any leaves, snow, or ice cleared regularly.  Use sand or salt on walking paths during winter.  Clean up any spills in your garage right away. This includes oil or grease spills. What can I do in the bathroom?  Use night lights.  Install grab bars  by the toilet and in the tub and shower. Do not use towel bars as grab bars.  Use non-skid mats or decals in the tub or shower.  If you need to sit down in the shower, use a plastic, non-slip stool.  Keep the floor dry. Clean up any water that spills on the floor as soon as it happens.  Remove soap buildup in the tub or shower regularly.  Attach bath mats securely with double-sided non-slip rug tape.  Do not have throw rugs and other things on the floor that can make you trip. What can I do in the bedroom?  Use night lights.  Make sure that you have a light by your bed that is easy to reach.  Do not use any sheets or blankets that are too big for your bed. They should not hang down onto the floor.  Have a firm chair that has side arms. You can use this for support while you get dressed.  Do not have throw rugs and other things on the floor that can make you trip. What can I do in the kitchen?  Clean up any spills  right away.  Avoid walking on wet floors.  Keep items that you use a lot in easy-to-reach places.  If you need to reach something above you, use a strong step stool that has a grab bar.  Keep electrical cords out of the way.  Do not use floor polish or wax that makes floors slippery. If you must use wax, use non-skid floor wax.  Do not have throw rugs and other things on the floor that can make you trip. What can I do with my stairs?  Do not leave any items on the stairs.  Make sure that there are handrails on both sides of the stairs and use them. Fix handrails that are broken or loose. Make sure that handrails are as long as the stairways.  Check any carpeting to make sure that it is firmly attached to the stairs. Fix any carpet that is loose or worn.  Avoid having throw rugs at the top or bottom of the stairs. If you do have throw rugs, attach them to the floor with carpet tape.  Make sure that you have a light switch at the top of the stairs and the  bottom of the stairs. If you do not have them, ask someone to add them for you. What else can I do to help prevent falls?  Wear shoes that:  Do not have high heels.  Have rubber bottoms.  Are comfortable and fit you well.  Are closed at the toe. Do not wear sandals.  If you use a stepladder:  Make sure that it is fully opened. Do not climb a closed stepladder.  Make sure that both sides of the stepladder are locked into place.  Ask someone to hold it for you, if possible.  Clearly mark and make sure that you can see:  Any grab bars or handrails.  First and last steps.  Where the edge of each step is.  Use tools that help you move around (mobility aids) if they are needed. These include:  Canes.  Walkers.  Scooters.  Crutches.  Turn on the lights when you go into a dark area. Replace any light bulbs as soon as they burn out.  Set up your furniture so you have a clear path. Avoid moving your furniture around.  If any of your floors are uneven, fix them.  If there are any pets around you, be aware of where they are.  Review your medicines with your doctor. Some medicines can make you feel dizzy. This can increase your chance of falling. Ask your doctor what other things that you can do to help prevent falls. This information is not intended to replace advice given to you by your health care provider. Make sure you discuss any questions you have with your health care provider. Document Released: 11/12/2008 Document Revised: 06/24/2015 Document Reviewed: 02/20/2014 Elsevier Interactive Patient Education  2017 [employer].

## 2020-03-22 NOTE — Addendum Note (Signed)
Addended by: Thressa Sheller on: 03/22/2020 10:49 AM   Modules accepted: Orders

## 2020-03-23 DIAGNOSIS — D2261 Melanocytic nevi of right upper limb, including shoulder: Secondary | ICD-10-CM | POA: Diagnosis not present

## 2020-03-23 DIAGNOSIS — L821 Other seborrheic keratosis: Secondary | ICD-10-CM | POA: Diagnosis not present

## 2020-03-23 DIAGNOSIS — L538 Other specified erythematous conditions: Secondary | ICD-10-CM | POA: Diagnosis not present

## 2020-03-23 DIAGNOSIS — D2262 Melanocytic nevi of left upper limb, including shoulder: Secondary | ICD-10-CM | POA: Diagnosis not present

## 2020-03-23 DIAGNOSIS — D2272 Melanocytic nevi of left lower limb, including hip: Secondary | ICD-10-CM | POA: Diagnosis not present

## 2020-03-23 DIAGNOSIS — B078 Other viral warts: Secondary | ICD-10-CM | POA: Diagnosis not present

## 2020-03-23 DIAGNOSIS — D2271 Melanocytic nevi of right lower limb, including hip: Secondary | ICD-10-CM | POA: Diagnosis not present

## 2020-03-23 DIAGNOSIS — D225 Melanocytic nevi of trunk: Secondary | ICD-10-CM | POA: Diagnosis not present

## 2020-05-21 DIAGNOSIS — M5416 Radiculopathy, lumbar region: Secondary | ICD-10-CM | POA: Diagnosis not present

## 2020-05-21 DIAGNOSIS — M5134 Other intervertebral disc degeneration, thoracic region: Secondary | ICD-10-CM | POA: Diagnosis not present

## 2020-05-21 DIAGNOSIS — M48061 Spinal stenosis, lumbar region without neurogenic claudication: Secondary | ICD-10-CM | POA: Diagnosis not present

## 2020-06-02 DIAGNOSIS — M5416 Radiculopathy, lumbar region: Secondary | ICD-10-CM | POA: Diagnosis not present

## 2020-06-14 DIAGNOSIS — L299 Pruritus, unspecified: Secondary | ICD-10-CM | POA: Diagnosis not present

## 2020-06-17 ENCOUNTER — Ambulatory Visit
Admission: RE | Admit: 2020-06-17 | Discharge: 2020-06-17 | Disposition: A | Discharge status: Home / Self Care | Payer: Medicare PPO | Source: Ambulatory Visit | Attending: Internal Medicine | Admitting: Internal Medicine

## 2020-06-17 ENCOUNTER — Other Ambulatory Visit: Payer: Self-pay

## 2020-06-17 DIAGNOSIS — I10 Essential (primary) hypertension: Secondary | ICD-10-CM | POA: Diagnosis not present

## 2020-06-17 DIAGNOSIS — E871 Hypo-osmolality and hyponatremia: Secondary | ICD-10-CM | POA: Diagnosis not present

## 2020-06-17 DIAGNOSIS — Z1231 Encounter for screening mammogram for malignant neoplasm of breast: Secondary | ICD-10-CM | POA: Insufficient documentation

## 2020-06-21 DIAGNOSIS — I10 Essential (primary) hypertension: Secondary | ICD-10-CM | POA: Diagnosis not present

## 2020-06-21 DIAGNOSIS — E871 Hypo-osmolality and hyponatremia: Secondary | ICD-10-CM | POA: Diagnosis not present

## 2020-06-25 DIAGNOSIS — M5416 Radiculopathy, lumbar region: Secondary | ICD-10-CM | POA: Diagnosis not present

## 2020-07-07 ENCOUNTER — Other Ambulatory Visit: Payer: Self-pay | Admitting: Internal Medicine

## 2020-07-07 DIAGNOSIS — E785 Hyperlipidemia, unspecified: Secondary | ICD-10-CM

## 2020-07-08 ENCOUNTER — Other Ambulatory Visit: Payer: Self-pay | Admitting: Internal Medicine

## 2020-07-08 DIAGNOSIS — I10 Essential (primary) hypertension: Secondary | ICD-10-CM

## 2020-07-16 ENCOUNTER — Ambulatory Visit: Payer: Medicare PPO | Admitting: Internal Medicine

## 2020-07-20 DIAGNOSIS — M19012 Primary osteoarthritis, left shoulder: Secondary | ICD-10-CM | POA: Diagnosis not present

## 2020-07-20 DIAGNOSIS — M25512 Pain in left shoulder: Secondary | ICD-10-CM | POA: Diagnosis not present

## 2020-07-28 ENCOUNTER — Other Ambulatory Visit: Payer: Self-pay | Admitting: Internal Medicine

## 2020-07-28 DIAGNOSIS — K219 Gastro-esophageal reflux disease without esophagitis: Secondary | ICD-10-CM

## 2020-07-29 ENCOUNTER — Ambulatory Visit: Payer: Medicare PPO | Admitting: Internal Medicine

## 2020-07-29 ENCOUNTER — Other Ambulatory Visit: Payer: Self-pay

## 2020-07-29 VITALS — BP 150/90 | HR 77 | Temp 97.8°F | Ht 59.0 in | Wt 162.2 lb

## 2020-07-29 DIAGNOSIS — Z13220 Encounter for screening for lipoid disorders: Secondary | ICD-10-CM | POA: Diagnosis not present

## 2020-07-29 DIAGNOSIS — Z1329 Encounter for screening for other suspected endocrine disorder: Secondary | ICD-10-CM | POA: Diagnosis not present

## 2020-07-29 DIAGNOSIS — K2950 Unspecified chronic gastritis without bleeding: Secondary | ICD-10-CM

## 2020-07-29 DIAGNOSIS — R1013 Epigastric pain: Secondary | ICD-10-CM | POA: Insufficient documentation

## 2020-07-29 DIAGNOSIS — E559 Vitamin D deficiency, unspecified: Secondary | ICD-10-CM | POA: Diagnosis not present

## 2020-07-29 DIAGNOSIS — H547 Unspecified visual loss: Secondary | ICD-10-CM

## 2020-07-29 DIAGNOSIS — M545 Low back pain, unspecified: Secondary | ICD-10-CM

## 2020-07-29 DIAGNOSIS — R519 Headache, unspecified: Secondary | ICD-10-CM

## 2020-07-29 DIAGNOSIS — H269 Unspecified cataract: Secondary | ICD-10-CM

## 2020-07-29 DIAGNOSIS — I10 Essential (primary) hypertension: Secondary | ICD-10-CM

## 2020-07-29 DIAGNOSIS — K219 Gastro-esophageal reflux disease without esophagitis: Secondary | ICD-10-CM

## 2020-07-29 DIAGNOSIS — E871 Hypo-osmolality and hyponatremia: Secondary | ICD-10-CM

## 2020-07-29 DIAGNOSIS — G8929 Other chronic pain: Secondary | ICD-10-CM

## 2020-07-29 DIAGNOSIS — M25512 Pain in left shoulder: Secondary | ICD-10-CM | POA: Insufficient documentation

## 2020-07-29 DIAGNOSIS — Z23 Encounter for immunization: Secondary | ICD-10-CM | POA: Diagnosis not present

## 2020-07-29 HISTORY — DX: Unspecified visual loss: H54.7

## 2020-07-29 HISTORY — DX: Unspecified chronic gastritis without bleeding: K29.50

## 2020-07-29 HISTORY — DX: Epigastric pain: R10.13

## 2020-07-29 MED ORDER — ESOMEPRAZOLE MAGNESIUM 20 MG PO CPDR: DELAYED_RELEASE_CAPSULE | ORAL | 3 refills | Status: DC

## 2020-07-29 MED ORDER — SPIRONOLACTONE 25 MG PO TABS: 25.0000 mg | ORAL_TABLET | Freq: Two times a day (BID) | ORAL | 3 refills | Status: DC

## 2020-07-29 MED ORDER — TETANUS-DIPHTH-ACELL PERTUSSIS 5-2.5-18.5 LF-MCG/0.5 IM SUSP
0.5000 mL | Freq: Once | INTRAMUSCULAR | 0 refills | Status: AC
Start: 2020-07-29 — End: 2020-07-29

## 2020-07-29 MED ORDER — SHINGRIX 50 MCG/0.5ML IM SUSR: 0.5000 mL | Freq: Once | INTRAMUSCULAR | 1 refills | Status: AC

## 2020-07-29 NOTE — Patient Instructions (Addendum)
Aspercream with lidocaine  Lidocaine pain patches   Call me back if GI referral to [facility] clinic is desired please

## 2020-07-29 NOTE — Progress Notes (Signed)
Pre visit review using our clinic review tool, if applicable. No additional management support is needed unless otherwise documented below in the visit note. 

## 2020-07-29 NOTE — Progress Notes (Signed)
Chief Complaint  Patient presents with   Follow-up   F/u  1. Htn elevated at times 140s-150s this am at home resting and relaxed 135/78 and hr in 57-60s on clonidine 0.1 bid, coreg 3.125 mg bid, spironolactone 25 mg qd   2. C/o right sided and h/a and reduced vision with reading will have her f/u with [facility] h/o cataract as well   3. H/o epigastric pain but resolved after taking nexium 20 mg with yogurt before dinner sx's resolved but if they return will refer to GI prev est [facility]  4. Chronic left shoulder ain with bone spur 07/13/20 with ortho had steroid inj and 1 month ago had back injection for chronic back pain  5. Chronic hypona sodium 131 06/17/20 with Dr. Candiss Norse  Review of Systems  Constitutional:  Negative for weight loss.  HENT:  Negative for hearing loss.   Eyes:        +reduced vision  Respiratory:  Negative for shortness of breath.   Cardiovascular:  Negative for chest pain.  Gastrointestinal:  Negative for abdominal pain.  Musculoskeletal:  Positive for back pain and joint pain. Negative for falls.  Skin:  Negative for rash.  Neurological:  Positive for headaches.  Psychiatric/Behavioral:  Negative for depression.   Past Medical History:  Diagnosis Date   Arthritis    Chronic low back pain    Complication of anesthesia    "All my muscles were sore all over my body was sore the next day.. due to mixture of gases"   Endometrial cancer (HCC)    GERD (gastroesophageal reflux disease)    Heart murmur 2018   tiney murmur, no treatment needed   Hypertension    Hyponatremia    chronic    Osteopenia    Prediabetes    RLS (restless legs syndrome)    Ulcer of the stomach caused by bacteria (H. pylori)    history of   Past Surgical History:  Procedure Laterality Date   ABDOMINAL HYSTERECTOMY  1992   BACK SURGERY  1989   L3, 4 and 5   DILATION AND CURETTAGE OF UTERUS  1992   KNEE ARTHROPLASTY Left 08/09/2016   Procedure: COMPUTER ASSISTED TOTAL KNEE  ARTHROPLASTY;  Surgeon: Dereck Leep, MD;  Location: [facility] ORS;  Service: Orthopedics;  Laterality: Left;   KNEE ARTHROSCOPY W/ MENISCAL REPAIR Left    Family History  Problem Relation Age of Onset   Breast cancer Daughter 40   Aneurysm 34        brain age 55 y.o died 2019/09/12   Alzheimer's disease Father    Social History   Socioeconomic History   Marital status: Widowed    Spouse name: Not on file   Number of children: Not on file   Years of education: Not on file   Highest education level: Not on file  Occupational History   Not on file  Tobacco Use   Smoking status: Never   Smokeless tobacco: Never  Vaping Use   Vaping Use: Never used  Substance and Sexual Activity   Alcohol use: Yes    Comment: a glass of wine 2-3 times a year   Drug use: No   Sexual activity: Not Currently  Other Topics Concern   Not on file  Social History Narrative   Married lives with husband Laketta Soderberg who is DPR    Husband died 2020-02-12   Apr 15, 2019 married 51 years    Social Determinants of Health  Financial Resource Strain: Low Risk    Difficulty of Paying Living Expenses: Not hard at all  Food Insecurity: No Food Insecurity   Worried About Charity fundraiser in the Last Year: Never true   Ran Out of Food in the Last Year: Never true  Transportation Needs: No Transportation Needs   Lack of Transportation (Medical): No   Lack of Transportation (Non-Medical): No  Physical Activity: Not on file  Stress: No Stress Concern Present   Feeling of Stress : Only a little  Social Connections: Unknown   Frequency of Communication with Friends and Family: More than three times a week   Frequency of Social Gatherings with Friends and Family: More than three times a week   Attends Religious Services: Not on file   Active Member of Clubs or Organizations: Yes   Attends Archivist Meetings: 1 to 4 times per year   Marital Status: Widowed  Human resources officer Violence: Not At Risk    Fear of Current or Ex-Partner: No   Emotionally Abused: No   Physically Abused: No   Sexually Abused: No   Current Meds  Medication Sig   acetaminophen (TYLENOL) 650 MG CR tablet Take 1,300 mg by mouth every 8 (eight) hours as needed for pain.   Calcium Carbonate Antacid (TUMS PO) Take by mouth.   Calcium Carbonate-Vitamin D3 600-400 MG-UNIT TABS Take 2 tablets by mouth daily.    calcium elemental as carbonate (BARIATRIC TUMS ULTRA) 400 MG chewable tablet Chew 1,000 mg by mouth as needed for heartburn.   carvedilol (COREG) 3.125 MG tablet TAKE 1 TABLET (3.125 MG TOTAL) BY MOUTH 2 (TWO) TIMES DAILY WITH A MEAL.   cloNIDine (CATAPRES) 0.1 MG tablet Take 1 tablet (0.1 mg total) by mouth in the morning and at bedtime.   Clotrimazole 1 % OINT Apply 1 application topically 2 (two) times daily as needed.   ezetimibe (ZETIA) 10 MG tablet TAKE 1 TABLET EVERY DAY   hydrocortisone 2.5 % ointment Apply topically 2 (two) times daily. Right great toe prn   lisinopril (ZESTRIL) 40 MG tablet Take 1 tablet (40 mg total) by mouth daily.   mupirocin ointment (BACTROBAN) 2 % Apply 1 application topically 2 (two) times daily. Prn right great toe   sodium chloride (OCEAN) 0.65 % SOLN nasal spray Place 2 sprays into both nostrils as needed for congestion.   Tdap (BOOSTRIX) 5-2.5-18.5 LF-MCG/0.5 injection Inject 0.5 mLs into the muscle once for 1 dose.   triamcinolone cream (KENALOG) 0.1 % Apply 1 application topically 2 (two) times daily. Arm prn x 7-14 days   VITAMIN D PO Take 2,000 Units by mouth daily.    Zoster Vaccine Adjuvanted [facility]) injection Inject 0.5 mLs into the muscle once for 1 dose. X 2 doses   [DISCONTINUED] esomeprazole (NEXIUM) 20 MG capsule TAKE 1 CAPSULE DAILY AT 12 NOON   [DISCONTINUED] spironolactone (ALDACTONE) 25 MG tablet Take 1 tablet (25 mg total) by mouth daily. 8 am   Allergies  Allergen Reactions   Amlodipine Swelling   Hydralazine     Rash   Hydralazine Hcl     Rash     Norvasc [Amlodipine Besylate]     5 mg leg swelling     Epinephrine Anxiety    Hyperactivity   Ibuprofen Other (See Comments)    Cannot take due to GI ulcer   Lovastatin Diarrhea, Nausea Only and Nausea And Vomiting   Morphine Nausea Only   Tramadol Nausea Only  Caused nausea, "felt awful all over", and med did not help with pain "Felt awful all over", and med did not help with pain   Zocor [Simvastatin] Diarrhea, Nausea And Vomiting and Nausea Only    Nausea and vomiting     No results found for this or any previous visit (from the past 2160 hour(s)). Objective  Body mass index is 32.76 kg/m. Wt Readings from Last 3 Encounters:  07/29/20 162 lb 3.2 oz (73.6 kg)  03/22/20 163 lb (73.9 kg)  03/18/20 163 lb 4 oz (74 kg)   Temp Readings from Last 3 Encounters:  07/29/20 97.8 F (36.6 C) (Skin)  03/18/20 97.9 F (36.6 C)  11/14/19 98.4 F (36.9 C) (Oral)   BP Readings from Last 3 Encounters:  07/29/20 (!) 150/90  03/18/20 (!) 146/82  11/14/19 138/86   Pulse Readings from Last 3 Encounters:  07/29/20 77  03/18/20 73  11/14/19 64    Physical Exam Vitals and nursing note reviewed.  Constitutional:      Appearance: Normal appearance. She is well-developed and well-groomed. She is obese.  HENT:     Head: Normocephalic and atraumatic.  Eyes:     Conjunctiva/sclera: Conjunctivae normal.     Pupils: Pupils are equal, round, and reactive to light.  Cardiovascular:     Rate and Rhythm: Normal rate and regular rhythm.     Heart sounds: Normal heart sounds. No murmur heard. Pulmonary:     Effort: Pulmonary effort is normal.     Breath sounds: Normal breath sounds.  Abdominal:     General: Abdomen is flat. Bowel sounds are normal.  Skin:    General: Skin is warm and dry.  Neurological:     General: No focal deficit present.     Mental Status: She is alert and oriented to person, place, and time. Mental status is at baseline.     Gait: Gait normal.  Psychiatric:         Attention and Perception: Attention and perception normal.        Mood and Affect: Mood and affect normal.        Speech: Speech normal.        Behavior: Behavior normal. Behavior is cooperative.        Thought Content: Thought content normal.        Cognition and Memory: Cognition and memory normal.        Judgment: Judgment normal.    Assessment  Plan  Hypertension, unspecified type - Plan: Lipid panel clonidine 0.1 bid, coreg 3.125 mg bid, spironolactone 25 mg qd can take another 25 mg if BP >140/>90 before 12 pm  Consider nifedipine xl in the future for better bp control if cant tolerate increased dose of diuretic   Gastroesophageal reflux disease - Plan: esomeprazole (NEXIUM) 20 MG capsule helping with h/o gastritis/h pylori  Epigastric pain  Consider ct abdomen in the future declines for now will let me know   Reduced vision ha and cataracts Plan: Ambulatory referral to Ophthalmology  Nonintractable episodic headache, unspecified headache type - Plan: Ambulatory referral to Ophthalmology Consider MRI brain in future right sided h/a pt wants to wait until eye appt  Chronic left shoulder pain Chronic low back pain, unspecified back pain laterality, unspecified whether sciatica present [facility]  Hyponatremia Monitor last renal panel 06/17/20 Dr. Candiss Norse will f/u in 6 months    HM Fasting labs cmet,lipid, tsh, vit D upcoming had labs 06/17/20 bmet, cbc  with renal   Flu shot utd Tdap had 10/18/10 Consider shingrix  Given Rx has note had had zostervax prevnar x 1 utd and pna 23 vx utd covid 4/4 moderna    Osteopenia 12/31/14 dexa consider repeat in future ordered  04/03/19 dexa osteopenia   Mammogram 06/17/20 negative   Colonoscopy dr. Tiffany Kocher h/o polyps and mom FH colon cancer -does not qualify cologuard -Obtained [facility] records to see if hemorrhoids Dr Tiffany Kocher 08/20/91 colonoscopy polyp sigmoid colon and IH with tubular adenoma path 08/20/91 EGD  gastritis bx active chronic gastritis neg H pylori 09/14/94 colonoscopy normal except IH Colonoscopy 01/20/00 polyp/colitis adenomatous polyp 03/26/03 colonoscopy h/o polyps 3 sessile polyps melanotic mucosa ileocecal valve path adenomatous and melanosis coli EGD 12/21/06 12 mm sessile polyp gastric antrum normal esophagus path moderate inactive chronic gastritis neg H pylori 09/07/06 colonoscopy IH, sessile polyp cecum 3 mm IH, small +, diverticulosis +tubular adenoma EGD 09/07/06 normal esophagus gastric ulcers with clean base, gastric polyp, gastritis path chronic inflammation chronic gastritis   06/29/09 EGD HH, gastritis superficial vascular congestion neg H pylori   12/01/11 stomach bx antral mucosa with foveolar hyperplasia and mild superficial vascular congestion neg bx   11/30/11 colonoscopy IH small otherwise normal egd 11/30/11 normal enlarged gastric folds neg bx    Skin normal see above referred Dr.Dasher   Kidney Dr. Wallace Keller appt 12/30/2018 Dr. Fletcher Anon cards [facility] podiatry Dr. Elvina Mattes retired 10/2019  [facility] for shoulder/back   [facility] long term [place] and [address] [facility]   Provider: Dr. Olivia Mackie McLean-Scocuzza-Internal Medicine

## 2020-08-03 ENCOUNTER — Other Ambulatory Visit: Payer: Self-pay | Admitting: Internal Medicine

## 2020-08-03 ENCOUNTER — Other Ambulatory Visit (INDEPENDENT_AMBULATORY_CARE_PROVIDER_SITE_OTHER): Payer: Medicare PPO

## 2020-08-03 ENCOUNTER — Other Ambulatory Visit: Payer: Self-pay

## 2020-08-03 DIAGNOSIS — E559 Vitamin D deficiency, unspecified: Secondary | ICD-10-CM

## 2020-08-03 DIAGNOSIS — I10 Essential (primary) hypertension: Secondary | ICD-10-CM | POA: Diagnosis not present

## 2020-08-03 DIAGNOSIS — Z1329 Encounter for screening for other suspected endocrine disorder: Secondary | ICD-10-CM

## 2020-08-03 DIAGNOSIS — E871 Hypo-osmolality and hyponatremia: Secondary | ICD-10-CM

## 2020-08-03 DIAGNOSIS — R1013 Epigastric pain: Secondary | ICD-10-CM | POA: Diagnosis not present

## 2020-08-03 DIAGNOSIS — Z13220 Encounter for screening for lipoid disorders: Secondary | ICD-10-CM | POA: Diagnosis not present

## 2020-08-03 LAB — LIPID PANEL
Cholesterol: 212 mg/dL — ABNORMAL HIGH (ref 0–200)
HDL: 63.8 mg/dL (ref >39.00)
NonHDL: 147.72
Total CHOL/HDL Ratio: 3
Triglycerides: 213 mg/dL — ABNORMAL HIGH (ref 0.0–149.0)
VLDL: 42.6 mg/dL — ABNORMAL HIGH (ref 0.0–40.0)

## 2020-08-03 LAB — COMPREHENSIVE METABOLIC PANEL
ALT: 16 U/L (ref 0–35)
AST: 20 U/L (ref 0–37)
Albumin: 4.4 g/dL (ref 3.5–5.2)
Alkaline Phosphatase: 60 U/L (ref 39–117)
BUN: 20 mg/dL (ref 6–23)
CO2: 25 mEq/L (ref 19–32)
Calcium: 10.4 mg/dL (ref 8.4–10.5)
Chloride: 93 mEq/L — ABNORMAL LOW (ref 96–112)
Creatinine, Ser: 0.81 mg/dL (ref 0.40–1.20)
GFR: 65.84 mL/min (ref >60.00)
Glucose, Bld: 86 mg/dL (ref 70–99)
Potassium: 5 mEq/L (ref 3.5–5.1)
Sodium: 127 mEq/L — ABNORMAL LOW (ref 135–145)
Total Bilirubin: 0.5 mg/dL (ref 0.2–1.2)
Total Protein: 7.3 g/dL (ref 6.0–8.3)

## 2020-08-03 LAB — TSH: TSH: 1.75 u[IU]/mL (ref 0.35–5.50)

## 2020-08-03 LAB — VITAMIN D 25 HYDROXY (VIT D DEFICIENCY, FRACTURES): VITD: 69.04 ng/mL (ref 30.00–100.00)

## 2020-08-03 LAB — LDL CHOLESTEROL, DIRECT: Direct LDL: 100 mg/dL

## 2020-08-03 MED ORDER — NIFEDIPINE ER OSMOTIC RELEASE 30 MG PO TB24: 30.0000 mg | ORAL_TABLET | Freq: Every day | ORAL | 0 refills | Status: DC

## 2020-08-03 MED ORDER — SPIRONOLACTONE 25 MG PO TABS: 25.0000 mg | ORAL_TABLET | Freq: Every day | ORAL | 3 refills | Status: DC

## 2020-08-04 ENCOUNTER — Other Ambulatory Visit: Payer: Self-pay | Admitting: Internal Medicine

## 2020-08-04 DIAGNOSIS — I10 Essential (primary) hypertension: Secondary | ICD-10-CM

## 2020-08-04 MED ORDER — NIFEDIPINE ER OSMOTIC RELEASE 30 MG PO TB24
30.0000 mg | ORAL_TABLET | Freq: Every day | ORAL | 0 refills | Status: DC
Start: 2020-08-04 — End: 2020-08-11

## 2020-08-10 ENCOUNTER — Telehealth: Payer: Self-pay | Admitting: Internal Medicine

## 2020-08-10 DIAGNOSIS — I10 Essential (primary) hypertension: Secondary | ICD-10-CM

## 2020-08-10 NOTE — Telephone Encounter (Signed)
Please see note below. 

## 2020-08-10 NOTE — Telephone Encounter (Signed)
Blood pressure perfect  Jill Stanley #[phone] RF x 3  Please rx procardia to mail order if tolerating

## 2020-08-10 NOTE — Telephone Encounter (Signed)
Patient called about her NIFEdipine (PROCARDIA-XL/NIFEDICAL-XL) 30 MG 24 hr tablet. She would like to know how low Dr Olivia Mackie wants her BP to be.  Patient's  bp this morning was 118/66, pulse 65., 1:30 pm it was 105/5/, pulse 71 and 2pm 116/59, pulse 71. Patient is feeling fine, not dizzy. Please call patient if she needs to do any changes.

## 2020-08-11 MED ORDER — NIFEDIPINE ER OSMOTIC RELEASE 30 MG PO TB24: 30.0000 mg | ORAL_TABLET | Freq: Every day | ORAL | 3 refills | Status: DC

## 2020-08-11 NOTE — Telephone Encounter (Signed)
Patient informed and verbalized understanding.  Medication sent in to preferred pharmacy per protocol.

## 2020-08-25 ENCOUNTER — Telehealth: Payer: Self-pay | Admitting: Internal Medicine

## 2020-08-25 NOTE — Telephone Encounter (Signed)
Will place readings on your desk. Please advise on taking tylenol

## 2020-08-25 NOTE — Telephone Encounter (Signed)
Patient dropped off Bp readings. Readings are up front in Dr Audrie Gallus color folder. Patient would like Arianna to call her, she would like to know if she can take Tylenol with her new BP medication.

## 2020-08-26 NOTE — Telephone Encounter (Signed)
Tylenol is ok  BP readings great continue to monitor take care

## 2020-08-27 NOTE — Telephone Encounter (Signed)
Patient informed and verbalized understanding

## 2020-10-05 DIAGNOSIS — Z96652 Presence of left artificial knee joint: Secondary | ICD-10-CM | POA: Diagnosis not present

## 2020-10-13 DIAGNOSIS — Z79891 Long term (current) use of opiate analgesic: Secondary | ICD-10-CM | POA: Diagnosis not present

## 2020-10-18 DIAGNOSIS — M48061 Spinal stenosis, lumbar region without neurogenic claudication: Secondary | ICD-10-CM | POA: Diagnosis not present

## 2020-10-18 DIAGNOSIS — G894 Chronic pain syndrome: Secondary | ICD-10-CM | POA: Diagnosis not present

## 2020-10-25 ENCOUNTER — Telehealth: Payer: Self-pay | Admitting: Internal Medicine

## 2020-10-25 NOTE — Telephone Encounter (Signed)
Call pt Triage for SOB, CP, leg redness or concerns for infection- all of which would warrant ED evaluation today  If bilateral and no acute concerns, she would need an appt with one of [place] here to address blood pressure, leg swelling  Please sch and let me know once scheduled

## 2020-10-25 NOTE — Telephone Encounter (Signed)
Patient state she is having bilateral feet and leg swelling. States this is worse in the afternoon. Onset of several weeks ago. A week after she started the NIFEdipine (PROCARDIA-XL/NIFEDICAL-XL) 30 MG 24 hr tablet.   Patient states this did not happen when she was on the spironolactone (ALDACTONE) 25 MG tablet but she was taken off due to her sodium dropping.   No pain, tight skin and swelling, no redness, no heat, no pitting edema.   Please advise

## 2020-10-25 NOTE — Telephone Encounter (Signed)
Spoken to patient, appointment has been scheduled 11-03-20 @1200 . Patient has no SX of SOB,CP,Headaches,Heart Palpitations, irregular heart beats, arm px, jaw px, and blurry vision. She stated since she changed medication she is starting to swell around lower legs and ankles, no redness. Sx are better at night and early morning but during the day she stated she retains fluid.

## 2020-10-26 NOTE — Telephone Encounter (Signed)
noted 

## 2020-11-03 ENCOUNTER — Ambulatory Visit: Payer: Medicare PPO | Admitting: Family

## 2020-11-04 DIAGNOSIS — M545 Low back pain, unspecified: Secondary | ICD-10-CM | POA: Diagnosis not present

## 2020-11-15 DIAGNOSIS — H2513 Age-related nuclear cataract, bilateral: Secondary | ICD-10-CM | POA: Diagnosis not present

## 2020-11-24 DIAGNOSIS — M47817 Spondylosis without myelopathy or radiculopathy, lumbosacral region: Secondary | ICD-10-CM | POA: Diagnosis not present

## 2020-11-30 DIAGNOSIS — M545 Low back pain, unspecified: Secondary | ICD-10-CM | POA: Diagnosis not present

## 2020-11-30 DIAGNOSIS — M47817 Spondylosis without myelopathy or radiculopathy, lumbosacral region: Secondary | ICD-10-CM | POA: Diagnosis not present

## 2020-12-08 ENCOUNTER — Other Ambulatory Visit: Payer: Self-pay

## 2020-12-08 ENCOUNTER — Encounter: Payer: Self-pay | Admitting: Internal Medicine

## 2020-12-08 ENCOUNTER — Ambulatory Visit: Payer: Medicare PPO | Admitting: Internal Medicine

## 2020-12-08 VITALS — BP 140/70 | HR 68 | Temp 97.8°F | Ht 59.0 in | Wt 168.2 lb

## 2020-12-08 DIAGNOSIS — H259 Unspecified age-related cataract: Secondary | ICD-10-CM | POA: Diagnosis not present

## 2020-12-08 DIAGNOSIS — E538 Deficiency of other specified B group vitamins: Secondary | ICD-10-CM | POA: Diagnosis not present

## 2020-12-08 DIAGNOSIS — E871 Hypo-osmolality and hyponatremia: Secondary | ICD-10-CM

## 2020-12-08 DIAGNOSIS — I10 Essential (primary) hypertension: Secondary | ICD-10-CM

## 2020-12-08 DIAGNOSIS — R600 Localized edema: Secondary | ICD-10-CM | POA: Diagnosis not present

## 2020-12-08 DIAGNOSIS — R0602 Shortness of breath: Secondary | ICD-10-CM | POA: Diagnosis not present

## 2020-12-08 MED ORDER — SPIRONOLACTONE 25 MG PO TABS: 25.0000 mg | ORAL_TABLET | Freq: Every day | ORAL | 3 refills | Status: DC

## 2020-12-08 MED ORDER — CYANOCOBALAMIN 1000 MCG/ML IJ SOLN: 1000.0000 ug | Freq: Once | INTRAMUSCULAR | Status: DC

## 2020-12-08 NOTE — Progress Notes (Signed)
Chief Complaint  Patient presents with   Edema   F/u  1. Htn sbp at home 120s on coreg 3.125 mg bid clonidine 0.1 bid lis 40 procardia 30 xl which caused leg swelling b/l L>R and stopped spironoactone 25 mg due to hypona Legs feel tight no pain  2. Chronic back arthritis had injection 11/24/20 and will have another 12/15/20 and considering burning of nerves injections last 2 hours 3. Resolved right ear pain, h/a and right lower mouth irritation after tooth pulled by endodontist and on Abx  Eye exam 10/2020 mild cataracts  4. C/o sob with exertion new has gained 7 lbs   Review of Systems  Constitutional:  Negative for weight loss.  HENT:  Positive for hearing loss.   Eyes:  Negative for blurred vision.  Respiratory:  Positive for shortness of breath.   Cardiovascular:  Positive for leg swelling. Negative for chest pain.  Musculoskeletal:  Negative for falls and joint pain.  Skin:  Negative for rash.  Neurological:  Negative for headaches.  Psychiatric/Behavioral:  Negative for memory loss.   Past Medical History:  Diagnosis Date   Arthritis    Chronic low back pain    Complication of anesthesia    "All my muscles were sore all over my body was sore the next day.. due to mixture of gases"   Endometrial cancer (HCC)    GERD (gastroesophageal reflux disease)    Heart murmur 2018   tiney murmur, no treatment needed   Hypertension    Hyponatremia    chronic    Osteopenia    Prediabetes    RLS (restless legs syndrome)    Ulcer of the stomach caused by bacteria (H. pylori)    history of   Past Surgical History:  Procedure Laterality Date   ABDOMINAL HYSTERECTOMY  1992   BACK SURGERY  1989   L3, 4 and 5   DILATION AND CURETTAGE OF UTERUS  1992   KNEE ARTHROPLASTY Left 08/09/2016   Procedure: COMPUTER ASSISTED TOTAL KNEE ARTHROPLASTY;  Surgeon: Dereck Leep, MD;  Location: [facility] ORS;  Service: Orthopedics;  Laterality: Left;   KNEE ARTHROSCOPY W/ MENISCAL REPAIR Left     Family History  Problem Relation Age of Onset   Breast cancer Daughter 26   Aneurysm 70        brain age 21 y.o died 08-20-19   Alzheimer's disease Father    Social History   Socioeconomic History   Marital status: Widowed    Spouse name: Not on file   Number of children: Not on file   Years of education: Not on file   Highest education level: Not on file  Occupational History   Not on file  Tobacco Use   Smoking status: Never   Smokeless tobacco: Never  Vaping Use   Vaping Use: Never used  Substance and Sexual Activity   Alcohol use: Yes    Comment: a glass of wine 2-3 times a year   Drug use: No   Sexual activity: Not Currently  Other Topics Concern   Not on file  Social History Narrative   Married lives with husband Nayara Taplin who is DPR    Husband died Jan 20, 2020   03/23/19 married 26 years    Social Determinants of Radio broadcast assistant Strain: Low Risk    Difficulty of Paying Living Expenses: Not hard at all  Food Insecurity: No Food Insecurity   Worried About Charity fundraiser in the Last  Year: Never true   [place] in the Last Year: Never true  Transportation Needs: No Transportation Needs   Lack of Transportation (Medical): No   Lack of Transportation (Non-Medical): No  Physical Activity: Not on file  Stress: No Stress Concern Present   Feeling of Stress : Only a little  Social Connections: Unknown   Frequency of Communication with Friends and Family: More than three times a week   Frequency of Social Gatherings with Friends and Family: More than three times a week   Attends Religious Services: Not on file   Active Member of Clubs or Organizations: Yes   Attends Archivist Meetings: 1 to 4 times per year   Marital Status: Widowed  Human resources officer Violence: Not At Risk   Fear of Current or Ex-Partner: No   Emotionally Abused: No   Physically Abused: No   Sexually Abused: No   Current Meds  Medication Sig    acetaminophen (TYLENOL) 650 MG CR tablet Take 1,300 mg by mouth every 8 (eight) hours as needed for pain.   Calcium Carbonate Antacid (TUMS PO) Take by mouth.   Calcium Carbonate-Vitamin D3 600-400 MG-UNIT TABS Take 2 tablets by mouth daily.    calcium elemental as carbonate (BARIATRIC TUMS ULTRA) 400 MG chewable tablet Chew 1,000 mg by mouth as needed for heartburn.   carvedilol (COREG) 3.125 MG tablet TAKE 1 TABLET (3.125 MG TOTAL) BY MOUTH 2 (TWO) TIMES DAILY WITH A MEAL.   cloNIDine (CATAPRES) 0.1 MG tablet Take 1 tablet (0.1 mg total) by mouth in the morning and at bedtime.   esomeprazole (NEXIUM) 20 MG capsule TAKE 1 CAPSULE DAILY AT 12 NOON   ezetimibe (ZETIA) 10 MG tablet TAKE 1 TABLET EVERY DAY   hydrocortisone 2.5 % ointment Apply topically 2 (two) times daily. Right great toe prn   lisinopril (ZESTRIL) 40 MG tablet Take 1 tablet (40 mg total) by mouth daily.   sodium chloride (OCEAN) 0.65 % SOLN nasal spray Place 2 sprays into both nostrils as needed for congestion.   VITAMIN D PO Take 2,000 Units by mouth daily.    [DISCONTINUED] NIFEdipine (PROCARDIA-XL/NIFEDICAL-XL) 30 MG 24 hr tablet Take 1 tablet (30 mg total) by mouth daily.   Current Facility-Administered Medications for the 12/08/20 encounter (Office Visit) with McLean-Scocuzza, Nino Glow, MD  Medication   cyanocobalamin ((VITAMIN B-12)) injection 1,000 mcg   Allergies  Allergen Reactions   Amlodipine Swelling   Hydralazine     Rash   Hydralazine Hcl     Rash    Norvasc [Amlodipine Besylate]     5 mg leg swelling     Procardia Xl [Nifedipine Er]     Leg swelling/ankle    Epinephrine Anxiety    Hyperactivity   Ibuprofen Other (See Comments)    Cannot take due to GI ulcer   Lovastatin Diarrhea, Nausea Only and Nausea And Vomiting   Morphine Nausea Only   Tramadol Nausea Only    Caused nausea, "felt awful all over", and med did not help with pain "Felt awful all over", and med did not help with pain   Zocor  [Simvastatin] Diarrhea, Nausea And Vomiting and Nausea Only    Nausea and vomiting     No results found for this or any previous visit (from the past 2160 hour(s)). Objective  Body mass index is 33.97 kg/m. Wt Readings from Last 3 Encounters:  12/08/20 168 lb 3.2 oz (76.3 kg)  07/29/20 162 lb 3.2 oz (  73.6 kg)  03/22/20 163 lb (73.9 kg)   Temp Readings from Last 3 Encounters:  12/08/20 97.8 F (36.6 C) (Temporal)  07/29/20 97.8 F (36.6 C) (Skin)  03/18/20 97.9 F (36.6 C)   BP Readings from Last 3 Encounters:  12/08/20 140/70  07/29/20 (!) 150/90  03/18/20 (!) 146/82   Pulse Readings from Last 3 Encounters:  12/08/20 68  07/29/20 77  03/18/20 73    Physical Exam Vitals and nursing note reviewed.  Constitutional:      Appearance: Normal appearance. She is well-developed and well-groomed.  HENT:     Head: Normocephalic and atraumatic.  Eyes:     Conjunctiva/sclera: Conjunctivae normal.     Pupils: Pupils are equal, round, and reactive to light.  Cardiovascular:     Rate and Rhythm: Normal rate and regular rhythm.     Heart sounds: Normal heart sounds. No murmur heard. Pulmonary:     Effort: Pulmonary effort is normal.     Breath sounds: Normal breath sounds.  Abdominal:     General: Abdomen is flat. Bowel sounds are normal.     Tenderness: There is no abdominal tenderness.  Musculoskeletal:        General: No tenderness.  Skin:    General: Skin is warm and dry.  Neurological:     General: No focal deficit present.     Mental Status: She is alert and oriented to person, place, and time. Mental status is at baseline.     Cranial Nerves: Cranial nerves 2-12 are intact.     Gait: Gait is intact.  Psychiatric:        Attention and Perception: Attention and perception normal.        Mood and Affect: Mood and affect normal.        Speech: Speech normal.        Behavior: Behavior normal. Behavior is cooperative.        Thought Content: Thought content normal.         Cognition and Memory: Cognition and memory normal.        Judgment: Judgment normal.    Assessment  Plan  Hypertension, lower reading at home sbp 120s Plan: spironolactone (ALDACTONE) 25 MG tablet, coreg 3.125 mg bid, clonidine 0.1 bid, lis 40 mg qd Consider increase coreg 6.25 mg bid ECHOCARDIOGRAM COMPLETE, Basic Metabolic Panel (BMET) f/u hyponatremia   B12 deficiency - Plan: cyanocobalamin ((VITAMIN B-12)) injection 1,000 mcg  Age-related cataract of both eyes, unspecified age-related cataract type Noted 10/2020   SOB (shortness of breath) on exertion - Plan: ECHOCARDIOGRAM COMPLETE  Bilateral leg edema  Will restart spironolactone 25 mg qd will not increase dose due to hyponatremia   HM Flu shot utd Tdap had 10/18/10 due Consider shingrix  Given Rx has note had had zostervax prevnar x 1 utd and pna 23 vx utd covid 4/4 moderna will get 5th dose    Osteopenia 12/31/14 dexa consider repeat in future ordered  04/03/19 dexa osteopenia   Mammogram 06/17/20 negative   Colonoscopy dr. Tiffany Kocher h/o polyps and mom FH colon cancer -does not qualify cologuard -Obtained [facility] records to see if hemorrhoids Dr Tiffany Kocher 08/20/91 colonoscopy polyp sigmoid colon and IH with tubular adenoma path 08/20/91 EGD gastritis bx active chronic gastritis neg H pylori 09/14/94 colonoscopy normal except IH Colonoscopy 01/20/00 polyp/colitis adenomatous polyp 03/26/03 colonoscopy h/o polyps 3 sessile polyps melanotic mucosa ileocecal valve path adenomatous and melanosis coli EGD 12/21/06 12 mm sessile polyp gastric antrum normal esophagus  path moderate inactive chronic gastritis neg H pylori 09/07/06 colonoscopy IH, sessile polyp cecum 3 mm IH, small +, diverticulosis +tubular adenoma EGD 09/07/06 normal esophagus gastric ulcers with clean base, gastric polyp, gastritis path chronic inflammation chronic gastritis   06/29/09 EGD HH, gastritis superficial vascular congestion neg H pylori   12/01/11 stomach bx  antral mucosa with foveolar hyperplasia and mild superficial vascular congestion neg bx   11/30/11 colonoscopy IH small otherwise normal egd 11/30/11 normal enlarged gastric folds neg bx    Skin normal see above referred Dr.Dasher   Kidney Dr. Wallace Keller appt 12/30/2018 Dr. Fletcher Anon cards [facility] podiatry Dr. Elvina Mattes retired 10/2019  [facility] for [facility]/back    [facility] term [place] [facility] [address] [facility]  Provider: Dr. Olivia Mackie McLean-Scocuzza-Internal Medicine

## 2020-12-08 NOTE — Progress Notes (Signed)
Medication entered in error on the wrong Patient due to Epic error in opening the wrong chart.   Patient not given B12 injection. Charges for medication and injection removed from her chart.

## 2020-12-08 NOTE — Patient Instructions (Addendum)
Consider 5th covid shot  Consider Tdap  Shingrix vaccine consider before 04/30/21    Hyponatremia Hyponatremia is when the amount of salt (sodium) in a person's blood is too low. When sodium levels are low, the cells absorb extra water, which causes them to swell. The swelling happens throughout the body, but it mostly affects the brain. What are the causes? This condition may be caused by: Certain medical conditions, such as: Heart, kidney, or liver problems. Thyroid problems. Adrenal gland problems. Metabolic conditions, such as Addison's disease or syndrome of inappropriate antidiuresis (SIAD). Excessive vomiting, diarrhea, or sweating. Certain medicines or illegal drugs. Fluids given through an IV. What increases the risk? You are more likely to develop this condition if you: Have certain medical conditions such as heart, kidney, or liver failure. Have a medical condition that causes frequent or excessive diarrhea. Participate in intense physical activities, such as marathon running. Take certain medicines that affect the sodium and fluid balance in the blood. Some of these medicine types include: Diuretics. NSAIDs, such as ibuprofen. Some opioid pain medicines. Some antidepressants. Some seizure prevention medicines. What are the signs or symptoms? Symptoms of this condition include: Headache. Nausea and vomiting. Being very tired (lethargic). Muscle weakness and cramping. Loss of appetite. Feeling weak or light-headed. Severe symptoms of this condition include: Confusion. Agitation. Having a rapid heart rate. Fainting. Seizures. Coma. How is this diagnosed? This condition is diagnosed based on: A physical exam. Your medical history. Tests, including: Blood tests. Urine tests. How is this treated? Treatment for this condition depends on the cause. Treatment may include: Getting fluids through an IV that is inserted into one of your veins. Medicines to correct  the sodium imbalance. If medicines are causing the condition, the medicines will need to be adjusted. Limiting your water or fluid intake to get the correct sodium balance, in certain cases. Monitoring in the hospital to closely watch your symptoms for improvement. Follow these instructions at home:  Take over-the-counter and prescription medicines only as told by your health care provider. Many medicines can make this condition worse. Talk with your health care provider about any medicines that you are currently taking. Do not drink alcohol. Keep all follow-up visits. This is important. Contact a health care provider if: You develop worsening nausea, fatigue, headache, confusion, or weakness. Your symptoms go away and then return. Get help right away if: You have a seizure. You faint. You have ongoing diarrhea or vomiting. Summary Hyponatremia is when the amount of salt (sodium) in your blood is too low. When sodium levels are low, your cells absorb extra water, which causes them to swell. The swelling happens throughout the body, but it mostly affects the brain. Treatment for this condition depends on the cause. It may include receiving IV fluids, taking or adjusting medicines, limiting fluid intake, and monitoring in the hospital. This information is not intended to replace advice given to you by your health care provider. Make sure you discuss any questions you have with your health care provider. Document Revised: 07/27/2020 Document Reviewed: 07/27/2020 Elsevier Patient Education  [place].

## 2020-12-09 LAB — BASIC METABOLIC PANEL
BUN: 23 mg/dL (ref 6–23)
CO2: 27 mEq/L (ref 19–32)
Calcium: 9.9 mg/dL (ref 8.4–10.5)
Chloride: 98 mEq/L (ref 96–112)
Creatinine, Ser: 0.9 mg/dL (ref 0.40–1.20)
GFR: 57.87 mL/min — ABNORMAL LOW (ref >60.00)
Glucose, Bld: 91 mg/dL (ref 70–99)
Potassium: 4.2 mEq/L (ref 3.5–5.1)
Sodium: 134 mEq/L — ABNORMAL LOW (ref 135–145)

## 2020-12-09 NOTE — Progress Notes (Signed)
You're welcome.  I will check account again later.  If it does drop, I will have it removed.  Thanks

## 2020-12-09 NOTE — Progress Notes (Signed)
Hi Dr McLean-Scocuzza,  The only charge pending for DOS 12/08/20 is an office visit.  The charge for a B12 injection did not drop.  Thanks, Sharyn Lull

## 2020-12-15 DIAGNOSIS — M47817 Spondylosis without myelopathy or radiculopathy, lumbosacral region: Secondary | ICD-10-CM | POA: Diagnosis not present

## 2020-12-20 DIAGNOSIS — M545 Low back pain, unspecified: Secondary | ICD-10-CM | POA: Diagnosis not present

## 2020-12-20 DIAGNOSIS — M5416 Radiculopathy, lumbar region: Secondary | ICD-10-CM | POA: Diagnosis not present

## 2020-12-20 DIAGNOSIS — M47817 Spondylosis without myelopathy or radiculopathy, lumbosacral region: Secondary | ICD-10-CM | POA: Diagnosis not present

## 2021-01-09 ENCOUNTER — Other Ambulatory Visit: Payer: Self-pay | Admitting: Internal Medicine

## 2021-01-09 DIAGNOSIS — I10 Essential (primary) hypertension: Secondary | ICD-10-CM

## 2021-01-28 ENCOUNTER — Other Ambulatory Visit: Payer: Self-pay

## 2021-01-28 ENCOUNTER — Ambulatory Visit: Payer: Medicare PPO | Admitting: Internal Medicine

## 2021-01-28 VITALS — BP 122/70 | HR 75 | Temp 97.9°F | Resp 16 | Ht 59.0 in | Wt 166.0 lb

## 2021-01-28 DIAGNOSIS — H9202 Otalgia, left ear: Secondary | ICD-10-CM | POA: Diagnosis not present

## 2021-01-28 DIAGNOSIS — N3281 Overactive bladder: Secondary | ICD-10-CM

## 2021-01-28 DIAGNOSIS — I10 Essential (primary) hypertension: Secondary | ICD-10-CM | POA: Diagnosis not present

## 2021-01-28 DIAGNOSIS — E785 Hyperlipidemia, unspecified: Secondary | ICD-10-CM | POA: Diagnosis not present

## 2021-01-28 DIAGNOSIS — R42 Dizziness and giddiness: Secondary | ICD-10-CM

## 2021-01-28 DIAGNOSIS — Z1231 Encounter for screening mammogram for malignant neoplasm of breast: Secondary | ICD-10-CM

## 2021-01-28 DIAGNOSIS — Z23 Encounter for immunization: Secondary | ICD-10-CM

## 2021-01-28 DIAGNOSIS — E611 Iron deficiency: Secondary | ICD-10-CM | POA: Diagnosis not present

## 2021-01-28 MED ORDER — TETANUS-DIPHTH-ACELL PERTUSSIS 5-2.5-18.5 LF-MCG/0.5 IM SUSP: 0.5000 mL | Freq: Once | INTRAMUSCULAR | 0 refills | Status: AC

## 2021-01-28 MED ORDER — CARVEDILOL 3.125 MG PO TABS
3.1250 mg | ORAL_TABLET | Freq: Two times a day (BID) | ORAL | 3 refills | Status: DC
Start: 2021-01-28 — End: 2021-07-29

## 2021-01-28 NOTE — Patient Instructions (Signed)
Dr. Richardson Landry -ENT  Phone Fax E-mail Address  [phone] Not available [place]   [place] [address]

## 2021-01-28 NOTE — Progress Notes (Signed)
Chief Complaint  Patient presents with   Follow-up   F/u 1. Htn controlled  spironolactone (ALDACTONE) 25 MG tablet, coreg 3.125 mg bid, clonidine 0.1 bid, lis 40 mg qd 2. Left pain and dizziness est Dr. Richardson Landry lying on the left side x 1 week    Review of Systems  Constitutional:  Negative for weight loss.  HENT:  Positive for ear pain. Negative for hearing loss.   Eyes:  Negative for blurred vision.  Respiratory:  Negative for shortness of breath.   Cardiovascular:  Negative for chest pain.  Gastrointestinal:  Negative for abdominal pain and blood in stool.  Genitourinary:  Negative for dysuria.  Musculoskeletal:  Negative for falls and joint pain.  Skin:  Negative for rash.  Neurological:  Positive for dizziness. Negative for headaches.  Psychiatric/Behavioral:  Negative for depression.   Past Medical History:  Diagnosis Date   Arthritis    Chronic low back pain    Complication of anesthesia    "All my muscles were sore all over my body was sore the next day.. due to mixture of gases"   Endometrial cancer (HCC)    GERD (gastroesophageal reflux disease)    Heart murmur 2018   tiney murmur, no treatment needed   Hypertension    Hyponatremia    chronic    Osteopenia    Prediabetes    RLS (restless legs syndrome)    Ulcer of the stomach caused by bacteria (H. pylori)    history of   Past Surgical History:  Procedure Laterality Date   ABDOMINAL HYSTERECTOMY  1992   BACK SURGERY  1989   L3, 4 and 5   DILATION AND CURETTAGE OF UTERUS  1992   KNEE ARTHROPLASTY Left 08/09/2016   Procedure: COMPUTER ASSISTED TOTAL KNEE ARTHROPLASTY;  Surgeon: Dereck Leep, MD;  Location: [facility] ORS;  Service: Orthopedics;  Laterality: Left;   KNEE ARTHROSCOPY W/ MENISCAL REPAIR Left    Family History  Problem Relation Age of Onset   Breast cancer Daughter 72   Aneurysm 73        brain age 64 y.o died August 22, 2019   Alzheimer's disease Father    Social History   Socioeconomic  History   Marital status: Widowed    Spouse name: Not on file   Number of children: Not on file   Years of education: Not on file   Highest education level: Not on file  Occupational History   Not on file  Tobacco Use   Smoking status: Never   Smokeless tobacco: Never  Vaping Use   Vaping Use: Never used  Substance and Sexual Activity   Alcohol use: Yes    Comment: a glass of wine 2-3 times a year   Drug use: No   Sexual activity: Not Currently  Other Topics Concern   Not on file  Social History Narrative   Married lives with husband Nike Southers who is DPR    Husband died 01-22-20   03-25-2019 married 14 years    Social Determinants of Radio broadcast assistant Strain: Low Risk    Difficulty of Paying Living Expenses: Not hard at all  Food Insecurity: No Food Insecurity   Worried About Charity fundraiser in the Last Year: Never true   Arboriculturist in the Last Year: Never true  Transportation Needs: No Transportation Needs   Lack of Transportation (Medical): No   Lack of Transportation (Non-Medical): No  Physical Activity: Not on file  Stress: No Stress Concern Present   Feeling of Stress : Only a little  Social Connections: Unknown   Frequency of Communication with Friends and Family: More than three times a week   Frequency of Social Gatherings with Friends and Family: More than three times a week   Attends Religious Services: Not on file   Active Member of Clubs or Organizations: Yes   Attends Archivist Meetings: 1 to 4 times per year   Marital Status: Widowed  Human resources officer Violence: Not At Risk   Fear of Current or Ex-Partner: No   Emotionally Abused: No   Physically Abused: No   Sexually Abused: No   Current Meds  Medication Sig   Tdap (BOOSTRIX) 5-2.5-18.5 LF-MCG/0.5 injection Inject 0.5 mLs into the muscle once for 1 dose.   Allergies  Allergen Reactions   Amlodipine Swelling   Hydralazine     Rash   Hydralazine Hcl     Rash     Norvasc [Amlodipine Besylate]     5 mg leg swelling     Procardia Xl [Nifedipine Er]     Leg swelling/ankle    Epinephrine Anxiety    Hyperactivity   Ibuprofen Other (See Comments)    Cannot take due to GI ulcer   Lovastatin Diarrhea, Nausea Only and Nausea And Vomiting   Morphine Nausea Only   Tramadol Nausea Only    Caused nausea, "felt awful all over", and med did not help with pain "Felt awful all over", and med did not help with pain   Zocor [Simvastatin] Diarrhea, Nausea And Vomiting and Nausea Only    Nausea and vomiting     Recent Results (from the past 2160 hour(s))  Basic Metabolic Panel (BMET)     Status: Abnormal   Collection Time: 12/08/20  4:36 PM  Result Value Ref Range   Sodium 134 (L) 135 - 145 mEq/L   Potassium 4.2 3.5 - 5.1 mEq/L   Chloride 98 96 - 112 mEq/L   CO2 27 19 - 32 mEq/L   Glucose, Bld 91 70 - 99 mg/dL   BUN 23 6 - 23 mg/dL   Creatinine, Ser 0.90 0.40 - 1.20 mg/dL   GFR 57.87 (L) >60.00 mL/min    Comment: Calculated using the CKD-EPI Creatinine Equation (2021)   Calcium 9.9 8.4 - 10.5 mg/dL   Objective  Body mass index is 33.53 kg/m. Wt Readings from Last 3 Encounters:  01/28/21 166 lb (75.3 kg)  12/08/20 168 lb 3.2 oz (76.3 kg)  07/29/20 162 lb 3.2 oz (73.6 kg)   Temp Readings from Last 3 Encounters:  01/28/21 97.9 F (36.6 C)  12/08/20 97.8 F (36.6 C) (Temporal)  07/29/20 97.8 F (36.6 C) (Skin)   BP Readings from Last 3 Encounters:  01/28/21 122/70  12/08/20 140/70  07/29/20 (!) 150/90   Pulse Readings from Last 3 Encounters:  01/28/21 75  12/08/20 68  07/29/20 77    Physical Exam Vitals and nursing note reviewed.  Constitutional:      Appearance: Normal appearance. She is well-developed and well-groomed.  HENT:     Head: Normocephalic and atraumatic.  Eyes:     Conjunctiva/sclera: Conjunctivae normal.     Pupils: Pupils are equal, round, and reactive to light.  Cardiovascular:     Rate and Rhythm: Normal rate and  regular rhythm.     Heart sounds: Normal heart sounds. No murmur heard. Pulmonary:     Effort: Pulmonary effort is normal.  Breath sounds: Normal breath sounds.  Abdominal:     General: Abdomen is flat. Bowel sounds are normal.     Tenderness: There is no abdominal tenderness.  Musculoskeletal:        General: No tenderness.  Skin:    General: Skin is warm and dry.  Neurological:     General: No focal deficit present.     Mental Status: She is alert and oriented to person, place, and time. Mental status is at baseline.     Cranial Nerves: Cranial nerves 2-12 are intact.     Gait: Gait is intact.  Psychiatric:        Attention and Perception: Attention and perception normal.        Mood and Affect: Mood and affect normal.        Speech: Speech normal.        Behavior: Behavior normal. Behavior is cooperative.        Thought Content: Thought content normal.        Cognition and Memory: Cognition and memory normal.        Judgment: Judgment normal.    Assessment  Plan    Hypertension, unspecified type - Plan: MR Brain Wo Contrast, Comprehensive metabolic panel, Lipid panel, CBC with Differential/Platelet spironolactone (ALDACTONE) 25 MG tablet, coreg 3.125 mg bid, clonidine 0.1 bid, lis 40 mg qd Consider echo in the future disc next fu   Dizziness - Plan: MR Brain Wo Contrast, Ambulatory referral to ENT Hyperlipidemia, unspecified hyperlipidemia type - Plan: Lipid panel  Overactive bladder - Plan: Urine Culture  Left ear pain - Plan: Ambulatory referral to ENT   HM Flu shot utd Tdap had 10/18/10 due rx today  Consider shingrix  Given Rx has note had had zostervax prevnar x 1 utd and pna 23 vx utd covid 5/5   Osteopenia 12/31/14 dexa consider repeat in future ordered  04/03/19 dexa osteopenia   Mammogram 06/17/20 negative   Colonoscopy dr. Tiffany Kocher h/o polyps and mom FH colon cancer -does not qualify cologuard -Obtained [facility] records to see if hemorrhoids Dr  Tiffany Kocher 08/20/91 colonoscopy polyp sigmoid colon and IH with tubular adenoma path 08/20/91 EGD gastritis bx active chronic gastritis neg H pylori 09/14/94 colonoscopy normal except IH Colonoscopy 01/20/00 polyp/colitis adenomatous polyp 03/26/03 colonoscopy h/o polyps 3 sessile polyps melanotic mucosa ileocecal valve path adenomatous and melanosis coli EGD 12/21/06 12 mm sessile polyp gastric antrum normal esophagus path moderate inactive chronic gastritis neg H pylori 09/07/06 colonoscopy IH, sessile polyp cecum 3 mm IH, small +, diverticulosis +tubular adenoma EGD 09/07/06 normal esophagus gastric ulcers with clean base, gastric polyp, gastritis path chronic inflammation chronic gastritis   06/29/09 EGD HH, gastritis superficial vascular congestion neg H pylori   12/01/11 stomach bx antral mucosa with foveolar hyperplasia and mild superficial vascular congestion neg bx   11/30/11 colonoscopy IH small otherwise normal egd 11/30/11 normal enlarged gastric folds neg bx    Skin normal see above referred Dr.Dasher   Kidney Dr. Wallace Keller appt 12/30/2018 Dr. Fletcher Anon cards [facility] podiatry Dr. Elvina Mattes retired 10/2019  [facility] for shoulder/back epidural x 4 and 02/14/21 [facility] will do nerve [facility]   [facility] long term [facility] [place] and [address] [facility]      Provider: Dr. Olivia Mackie McLean-Scocuzza-Internal Medicine

## 2021-02-09 DIAGNOSIS — M5416 Radiculopathy, lumbar region: Secondary | ICD-10-CM | POA: Diagnosis not present

## 2021-02-14 DIAGNOSIS — M47817 Spondylosis without myelopathy or radiculopathy, lumbosacral region: Secondary | ICD-10-CM | POA: Diagnosis not present

## 2021-02-14 DIAGNOSIS — M545 Low back pain, unspecified: Secondary | ICD-10-CM | POA: Diagnosis not present

## 2021-02-14 DIAGNOSIS — M5416 Radiculopathy, lumbar region: Secondary | ICD-10-CM | POA: Diagnosis not present

## 2021-02-15 ENCOUNTER — Other Ambulatory Visit: Payer: Self-pay

## 2021-02-15 ENCOUNTER — Ambulatory Visit
Admission: RE | Admit: 2021-02-15 | Discharge: 2021-02-15 | Disposition: A | Discharge status: Home / Self Care | Payer: Medicare PPO | Source: Ambulatory Visit | Attending: Internal Medicine | Admitting: Internal Medicine

## 2021-02-15 DIAGNOSIS — I10 Essential (primary) hypertension: Secondary | ICD-10-CM | POA: Insufficient documentation

## 2021-02-15 DIAGNOSIS — R42 Dizziness and giddiness: Secondary | ICD-10-CM | POA: Diagnosis not present

## 2021-02-15 DIAGNOSIS — G319 Degenerative disease of nervous system, unspecified: Secondary | ICD-10-CM | POA: Diagnosis not present

## 2021-02-16 ENCOUNTER — Telehealth: Payer: Self-pay

## 2021-02-16 NOTE — Telephone Encounter (Signed)
See result note.  

## 2021-02-16 NOTE — Telephone Encounter (Signed)
Patient called in returning call to Judson Roch, Please call patient [phone]

## 2021-02-16 NOTE — Telephone Encounter (Signed)
LMTCB for MRI results.

## 2021-02-23 ENCOUNTER — Telehealth: Payer: Self-pay | Admitting: Internal Medicine

## 2021-02-23 NOTE — Telephone Encounter (Signed)
Noted  

## 2021-02-23 NOTE — Telephone Encounter (Signed)
Rejection Reason - Other - pt cancelled" Tiera Trollinger said on Feb 23, 2021 1:55 PM  Pt appt was on 02/23/2021 at 2:15 pm  Msg from [place] ent

## 2021-03-03 ENCOUNTER — Other Ambulatory Visit: Payer: Self-pay

## 2021-03-03 ENCOUNTER — Other Ambulatory Visit (INDEPENDENT_AMBULATORY_CARE_PROVIDER_SITE_OTHER): Payer: Medicare PPO

## 2021-03-03 DIAGNOSIS — E785 Hyperlipidemia, unspecified: Secondary | ICD-10-CM | POA: Diagnosis not present

## 2021-03-03 DIAGNOSIS — N3281 Overactive bladder: Secondary | ICD-10-CM

## 2021-03-03 DIAGNOSIS — E611 Iron deficiency: Secondary | ICD-10-CM | POA: Diagnosis not present

## 2021-03-03 DIAGNOSIS — I10 Essential (primary) hypertension: Secondary | ICD-10-CM

## 2021-03-03 LAB — CBC WITH DIFFERENTIAL/PLATELET
Basophils Absolute: 0 10*3/uL (ref 0.0–0.1)
Basophils Relative: 0.5 % (ref 0.0–3.0)
Eosinophils Absolute: 0.1 10*3/uL (ref 0.0–0.7)
Eosinophils Relative: 1.7 % (ref 0.0–5.0)
HCT: 40.7 % (ref 36.0–46.0)
Hemoglobin: 13.5 g/dL (ref 12.0–15.0)
Lymphocytes Relative: 54.9 % — ABNORMAL HIGH (ref 12.0–46.0)
Lymphs Abs: 3.1 10*3/uL (ref 0.7–4.0)
MCHC: 33.1 g/dL (ref 30.0–36.0)
MCV: 93.2 fl (ref 78.0–100.0)
Monocytes Absolute: 0.4 10*3/uL (ref 0.1–1.0)
Monocytes Relative: 7.2 % (ref 3.0–12.0)
Neutro Abs: 2 10*3/uL (ref 1.4–7.7)
Neutrophils Relative %: 35.7 % — ABNORMAL LOW (ref 43.0–77.0)
Platelets: 230 10*3/uL (ref 150.0–400.0)
RBC: 4.36 Mil/uL (ref 3.87–5.11)
RDW: 12.9 % (ref 11.5–15.5)
WBC: 5.6 10*3/uL (ref 4.0–10.5)

## 2021-03-03 LAB — COMPREHENSIVE METABOLIC PANEL
ALT: 14 U/L (ref 0–35)
AST: 21 U/L (ref 0–37)
Albumin: 4.2 g/dL (ref 3.5–5.2)
Alkaline Phosphatase: 54 U/L (ref 39–117)
BUN: 22 mg/dL (ref 6–23)
CO2: 30 mEq/L (ref 19–32)
Calcium: 10.2 mg/dL (ref 8.4–10.5)
Chloride: 98 mEq/L (ref 96–112)
Creatinine, Ser: 0.86 mg/dL (ref 0.40–1.20)
GFR: 61.02 mL/min (ref >60.00)
Glucose, Bld: 87 mg/dL (ref 70–99)
Potassium: 4.5 mEq/L (ref 3.5–5.1)
Sodium: 134 mEq/L — ABNORMAL LOW (ref 135–145)
Total Bilirubin: 0.5 mg/dL (ref 0.2–1.2)
Total Protein: 6.6 g/dL (ref 6.0–8.3)

## 2021-03-03 LAB — IBC + FERRITIN
Ferritin: 104.5 ng/mL (ref 10.0–291.0)
Iron: 141 ug/dL (ref 42–145)
Saturation Ratios: 38.4 % (ref 20.0–50.0)
TIBC: 366.8 ug/dL (ref 250.0–450.0)
Transferrin: 262 mg/dL (ref 212.0–360.0)

## 2021-03-03 LAB — LIPID PANEL
Cholesterol: 197 mg/dL (ref 0–200)
HDL: 62.2 mg/dL (ref >39.00)
LDL Cholesterol: 102 mg/dL — ABNORMAL HIGH (ref 0–99)
NonHDL: 134.76
Total CHOL/HDL Ratio: 3
Triglycerides: 164 mg/dL — ABNORMAL HIGH (ref 0.0–149.0)
VLDL: 32.8 mg/dL (ref 0.0–40.0)

## 2021-03-04 LAB — URINE CULTURE
MICRO NUMBER:: 12955196
Result:: NO GROWTH
SPECIMEN QUALITY:: ADEQUATE

## 2021-03-10 ENCOUNTER — Ambulatory Visit: Payer: Medicare PPO | Admitting: Internal Medicine

## 2021-03-23 ENCOUNTER — Ambulatory Visit: Payer: Medicare PPO

## 2021-04-19 ENCOUNTER — Ambulatory Visit (INDEPENDENT_AMBULATORY_CARE_PROVIDER_SITE_OTHER): Payer: Medicare PPO

## 2021-04-19 VITALS — Ht 59.0 in | Wt 166.0 lb

## 2021-04-19 DIAGNOSIS — Z0000 Encounter for general adult medical examination without abnormal findings: Secondary | ICD-10-CM

## 2021-04-19 NOTE — Patient Instructions (Addendum)
?  Jill Stanley , ?Thank you for taking time to come for your Medicare Wellness Visit. I appreciate your ongoing commitment to your health goals. Please review the following plan we discussed and let me know if I can assist you in the future.  ? ?These are the goals we discussed: ? Goals   ? ?  Weight (lb) < 160 lb (72.6 kg)   ?  I want to lose 4lb with portion control, and reducing sugar intake ?  ? ?  ?  ?This is a list of the screening recommended for you and due dates:  ?Health Maintenance  ?Topic Date Due  ? Zoster (Shingles) Vaccine (2 of 2) 04/28/2021*  ? Tetanus Vaccine  04/20/2022*  ? Pneumonia Vaccine  Completed  ? Flu Shot  Completed  ? DEXA scan (bone density measurement)  Completed  ? COVID-19 Vaccine  Completed  ? HPV Vaccine  Aged Out  ?*Topic was postponed. The date shown is not the original due date.  ?  ?Call to schedule mammogram.  ?

## 2021-04-19 NOTE — Progress Notes (Signed)
Subjective:   Jill Stanley is a 88 y.o. female who presents for Medicare Annual (Subsequent) preventive examination.  Review of Systems    No ROS.  Medicare Wellness Virtual Visit.  Visual/audio telehealth visit, UTA vital signs.   See social history for additional risk factors.   Cardiac Risk Factors include: advanced age (>43men, >27 women)     Objective:    Today's Vitals   04/19/21 1042  Weight: 166 lb (75.3 kg)  Height: 4\' 11"  (1.499 m)   Body mass index is 33.53 kg/m.  Advanced Directives 04/19/2021 03/22/2020 03/20/2019 05/06/2018 08/09/2016 08/09/2016 07/26/2016  Does Patient Have a Medical Advance Directive? Yes Yes Yes No No Yes Yes  Type of Estate agent of [place];Living will Healthcare Power of [place];Living will Healthcare Power of [place];Living will - Healthcare Power of [place];Living will Healthcare Power of [place];Living will Living will;Healthcare Power of Attorney  Does patient want to make changes to medical advance directive? No - Patient declined No - Patient declined No - Patient declined - No - Patient declined - No - Patient declined  Copy of Healthcare Power of Attorney in Chart? No - copy requested Yes - validated most recent copy scanned in chart (See row information) Yes - validated most recent copy scanned in chart (See row information) - Yes Yes No - copy requested  Would patient like information on creating a medical advance directive? - - - No - Patient declined No - Patient declined - -    Current Medications (verified) Outpatient Encounter Medications as of 04/19/2021  Medication Sig   acetaminophen (TYLENOL) 650 MG CR tablet Take 1,300 mg by mouth every 8 (eight) hours as needed for pain.   Calcium Carbonate Antacid (TUMS PO) Take by mouth.   Calcium Carbonate-Vitamin D3 600-400 MG-UNIT TABS Take 2 tablets by mouth daily.    calcium elemental as carbonate (BARIATRIC TUMS ULTRA) 400 MG chewable tablet Chew 1,000 mg by  mouth as needed for heartburn.   carvedilol (COREG) 3.125 MG tablet Take 1 tablet (3.125 mg total) by mouth 2 (two) times daily with a meal.   cloNIDine (CATAPRES) 0.1 MG tablet TAKE 1 TABLET (0.1 MG TOTAL) BY MOUTH IN THE MORNING AND AT BEDTIME.   esomeprazole (NEXIUM) 20 MG capsule TAKE 1 CAPSULE DAILY AT 12 NOON   ezetimibe (ZETIA) 10 MG tablet TAKE 1 TABLET EVERY DAY   hydrocortisone 2.5 % ointment Apply topically 2 (two) times daily. Right great toe prn   lisinopril (ZESTRIL) 40 MG tablet TAKE 1 TABLET EVERY DAY   sodium chloride (OCEAN) 0.65 % SOLN nasal spray Place 2 sprays into both nostrils as needed for congestion.   spironolactone (ALDACTONE) 25 MG tablet Take 1 tablet (25 mg total) by mouth daily. In am See change in sig 1x per day   VITAMIN D PO Take 2,000 Units by mouth daily.    No facility-administered encounter medications on file as of 04/19/2021.    Allergies (verified) Amlodipine, Hydralazine, Hydralazine hcl, Norvasc [amlodipine besylate], Procardia xl [nifedipine er], Epinephrine, Ibuprofen, Lovastatin, Morphine, Tramadol, and Zocor [simvastatin]   History: Past Medical History:  Diagnosis Date   Arthritis    Chronic low back pain    Complication of anesthesia    "All my muscles were sore all over my body was sore the next day.. due to mixture of gases"   Endometrial cancer (HCC)    GERD (gastroesophageal reflux disease)    Heart murmur 2018   tiney murmur, no  treatment needed   Hypertension    Hyponatremia    chronic    Osteopenia    Prediabetes    RLS (restless legs syndrome)    Ulcer of the stomach caused by bacteria (H. pylori)    history of   Past Surgical History:  Procedure Laterality Date   ABDOMINAL HYSTERECTOMY  1992   BACK SURGERY  1989   L3, 4 and 5   DILATION AND CURETTAGE OF UTERUS  1992   KNEE ARTHROPLASTY Left 08/09/2016   Procedure: COMPUTER ASSISTED TOTAL KNEE ARTHROPLASTY;  Surgeon: Donato Heinz, MD;  Location: [facility] ORS;  Service:  Orthopedics;  Laterality: Left;   KNEE ARTHROSCOPY W/ MENISCAL REPAIR Left    Family History  Problem Relation Age of Onset   Breast cancer Daughter 33   Aneurysm Grandson        brain age 50 y.o died 09/13/19   Alzheimer's disease Father    Social History   Socioeconomic History   Marital status: Widowed    Spouse name: Not on file   Number of children: Not on file   Years of education: Not on file   Highest education level: Not on file  Occupational History   Not on file  Tobacco Use   Smoking status: Never   Smokeless tobacco: Never  Vaping Use   Vaping Use: Never used  Substance and Sexual Activity   Alcohol use: Yes    Comment: a glass of wine 2-3 times a year   Drug use: No   Sexual activity: Not Currently  Other Topics Concern   Not on file  Social History Narrative   Married lives with husband Alayja Arjona who is DPR    Husband died Feb 13, 2020   04/16/2019 married 65 years    Social Determinants of Corporate investment banker Strain: Low Risk    Difficulty of Paying Living Expenses: Not hard at all  Food Insecurity: No Food Insecurity   Worried About Programme researcher, broadcasting/film/video in the Last Year: Never true   Barista in the Last Year: Never true  Transportation Needs: No Transportation Needs   Lack of Transportation (Medical): No   Lack of Transportation (Non-Medical): No  Physical Activity: Not on file  Stress: No Stress Concern Present   Feeling of Stress : Not at all  Social Connections: Unknown   Frequency of Communication with Friends and Family: More than three times a week   Frequency of Social Gatherings with Friends and Family: More than three times a week   Attends Religious Services: Not on file   Active Member of Clubs or Organizations: Yes   Attends Banker Meetings: 1 to 4 times per year   Marital Status: Widowed    Tobacco Counseling Counseling given: Not Answered   Clinical Intake:  Pre-visit preparation completed: Yes         Diabetes: No  How often do you need to have someone help you when you read instructions, pamphlets, or other written materials from your doctor or pharmacy?: 1 - Never Interpreter Needed?: No    Activities of Daily Living In your present state of health, do you have any difficulty performing the following activities: 04/19/2021  Hearing? Y  Comment Hearing aid  Vision? N  Difficulty concentrating or making decisions? N  Walking or climbing stairs? N  Dressing or bathing? N  Doing errands, shopping? N  Preparing Food and eating ? N  Using the Toilet? N  In the past six months, have you accidently leaked urine? Y  Comment Managed with daily pad  Do you have problems with loss of bowel control? N  Managing your Medications? N  Managing your Finances? N  Housekeeping or managing your Housekeeping? N  Some recent data might be hidden    Patient Care Team: McLean-Scocuzza, Pasty Spillers, MD as PCP - General (Internal Medicine)  Indicate any recent Medical Services you may have received from other than [facility] providers in the past year (date may be approximate).     Assessment:   This is a routine wellness examination for Leslieann.  Virtual Visit via Telephone Note  I connected with  Dola Factor on 04/19/21 at 10:30 AM EDT by telephone and verified that I am speaking with the correct person using two identifiers.  Persons participating in the virtual visit: patient/Nurse Health Advisor   I discussed the limitations of performing an evaluation and management service by telehealth. The patient expressed understanding and agreed to proceed. We continued and completed visit with audio only.  Some vital signs may be absent or patient reported.   Hearing/Vision screen Hearing Screening - Comments:: Hearing aid, bilateral Followed by Leslie Dales, [facility] Vision Screening - Comments:: Wears corrective lenses They have seen their ophthalmologist in the last 12 months.    Dietary issues and exercise activities discussed: Current Exercise Habits: Home exercise routine, Type of exercise: walking, Intensity: Mild Regular diet Water restriction 32 ounces daily   Goals Addressed             This Visit's Progress    Weight (lb) < 160 lb (72.6 kg)   166 lb (75.3 kg)    I want to lose 4lb with portion control, and reducing sugar intake       Depression Screen PHQ 2/9 Scores 04/19/2021 07/29/2020 03/22/2020 03/28/2019 03/20/2019 12/19/2018 09/12/2018  PHQ - 2 Score 0 0 0 0 0 0 0    Fall Risk Fall Risk  04/19/2021 12/08/2020 07/29/2020 03/22/2020 11/14/2019  Falls in the past year? 0 0 1 0 0  Number falls in past yr: 0 0 1 0 0  Injury with Fall? - 0 1 0 0  Risk for fall due to : - No Fall Risks History of fall(s) - -  Follow up Falls evaluation completed Falls evaluation completed Falls evaluation completed Falls evaluation completed Falls evaluation completed    FALL RISK PREVENTION PERTAINING TO THE HOME: Home free of loose throw rugs in walkways, pet beds, electrical cords, etc? Yes  Adequate lighting in your home to reduce risk of falls? Yes   ASSISTIVE DEVICES UTILIZED TO PREVENT FALLS:  Life alert? Yes  Use of a cane, walker or w/c? No  Grab bars in the bathroom? Yes  Shower chair or bench in shower? No  Elevated toilet seat or a handicapped toilet? No   TIMED UP AND GO: Was the test performed? No .   Cognitive Function:  Patient is alert and oriented x3.  Normal cognitive status assessed by direct observation/communication. No abnormalities found.   6CIT Screen 03/22/2020 03/20/2019  What Year? 0 points 0 points  What month? 0 points 0 points  What time? 0 points 0 points  Count back from 20 - 0 points  Months in reverse 0 points 0 points  Repeat phrase - 0 points  Total Score - 0   Immunizations Immunization History  Administered Date(s) Administered   Influenza, High Dose Seasonal PF 11/29/2016, 10/17/2018  Influenza,inj,Quad PF,6+  Mos 10/29/2017   Influenza-Unspecified 11/12/2019, 10/30/2020   Moderna Covid-19 Vaccine Bivalent Booster 65yrs & up 12/17/2020   Moderna Sars-Covid-2 Vaccination 03/26/2019, 04/23/2019, 01/12/2020, 07/20/2020   Pneumococcal Conjugate-13 09/12/2018   Pneumococcal Polysaccharide-23 03/18/2020   Tdap 10/18/2010   Zoster Recombinat (Shingrix) 10/30/2020   Zoster, Live 05/01/2012   TDAP status: Due, Education has been provided regarding the importance of this vaccine. Advised may receive this vaccine at local pharmacy or Health Dept. Aware to provide a copy of the vaccination record if obtained from local pharmacy or Health Dept. Verbalized acceptance and understanding. Deferred.   Screening Tests Health Maintenance  Topic Date Due   Zoster Vaccines- Shingrix (2 of 2) 04/28/2021 (Originally 12/25/2020)   TETANUS/TDAP  04/20/2022 (Originally 10/17/2020)   Pneumonia Vaccine 62+ Years old  Completed   INFLUENZA VACCINE  Completed   DEXA SCAN  Completed   COVID-19 Vaccine  Completed   HPV VACCINES  Aged Out   Health Maintenance There are no preventive care reminders to display for this patient.  Mammogram- agrees to call [facility] to schedule. Number provided [phone].  Lung Cancer Screening: (Low Dose CT Chest recommended if Age 19-80 years, 30 pack-year currently smoking OR have quit w/in 15years.) does not qualify.   Hepatitis C Screening: does not qualify.  Vision Screening: Recommended annual ophthalmology exams for early detection of glaucoma and other disorders of the eye.  Dental Screening: Recommended annual dental exams for proper oral hygiene  Community Resource Referral / Chronic Care Management: CRR required this visit?  No   CCM required this visit?  No      Plan:   Keep all routine maintenance appointments.   I have personally reviewed and noted the following in the patient's chart:   Medical and social history Use of alcohol, tobacco or  illicit drugs  Current medications and supplements including opioid prescriptions.  Functional ability and status Nutritional status Physical activity Advanced directives List of other physicians Hospitalizations, surgeries, and ER visits in previous 12 months Vitals Screenings to include cognitive, depression, and falls Referrals and appointments  In addition, I have reviewed and discussed with patient certain preventive protocols, quality metrics, and best practice recommendations. A written personalized care plan for preventive services as well as general preventive health recommendations were provided to patient via mychart.     Ashok Pall, LPN   [id number]

## 2021-06-13 DIAGNOSIS — R42 Dizziness and giddiness: Secondary | ICD-10-CM | POA: Diagnosis not present

## 2021-06-20 ENCOUNTER — Ambulatory Visit
Admission: RE | Admit: 2021-06-20 | Discharge: 2021-06-20 | Disposition: A | Discharge status: Home / Self Care | Payer: Medicare PPO | Source: Ambulatory Visit | Attending: Internal Medicine | Admitting: Internal Medicine

## 2021-06-20 DIAGNOSIS — Z1231 Encounter for screening mammogram for malignant neoplasm of breast: Secondary | ICD-10-CM | POA: Insufficient documentation

## 2021-07-04 ENCOUNTER — Other Ambulatory Visit: Payer: Self-pay | Admitting: Internal Medicine

## 2021-07-04 DIAGNOSIS — E785 Hyperlipidemia, unspecified: Secondary | ICD-10-CM

## 2021-07-29 ENCOUNTER — Ambulatory Visit (INDEPENDENT_AMBULATORY_CARE_PROVIDER_SITE_OTHER): Payer: Medicare PPO | Admitting: Internal Medicine

## 2021-07-29 ENCOUNTER — Encounter: Payer: Self-pay | Admitting: Internal Medicine

## 2021-07-29 VITALS — BP 130/80 | HR 62 | Temp 98.0°F | Resp 14 | Ht 59.0 in | Wt 166.2 lb

## 2021-07-29 DIAGNOSIS — N3000 Acute cystitis without hematuria: Secondary | ICD-10-CM | POA: Diagnosis not present

## 2021-07-29 DIAGNOSIS — R32 Unspecified urinary incontinence: Secondary | ICD-10-CM | POA: Diagnosis not present

## 2021-07-29 DIAGNOSIS — K219 Gastro-esophageal reflux disease without esophagitis: Secondary | ICD-10-CM | POA: Diagnosis not present

## 2021-07-29 DIAGNOSIS — Z1231 Encounter for screening mammogram for malignant neoplasm of breast: Secondary | ICD-10-CM | POA: Diagnosis not present

## 2021-07-29 DIAGNOSIS — I700 Atherosclerosis of aorta: Secondary | ICD-10-CM | POA: Insufficient documentation

## 2021-07-29 DIAGNOSIS — I10 Essential (primary) hypertension: Secondary | ICD-10-CM

## 2021-07-29 MED ORDER — GEMTESA 75 MG PO TABS: 1.0000 | ORAL_TABLET | Freq: Every day | ORAL | 0 refills | Status: DC

## 2021-07-29 MED ORDER — SPIRONOLACTONE 25 MG PO TABS: 25.0000 mg | ORAL_TABLET | Freq: Every day | ORAL | 3 refills | Status: DC

## 2021-07-29 MED ORDER — LISINOPRIL 40 MG PO TABS: 40.0000 mg | ORAL_TABLET | Freq: Every day | ORAL | 3 refills | Status: DC

## 2021-07-29 MED ORDER — CLONIDINE HCL 0.1 MG PO TABS: 0.1000 mg | ORAL_TABLET | Freq: Two times a day (BID) | ORAL | 3 refills | Status: DC

## 2021-07-29 MED ORDER — ESOMEPRAZOLE MAGNESIUM 20 MG PO CPDR: DELAYED_RELEASE_CAPSULE | ORAL | 3 refills | Status: DC

## 2021-07-29 MED ORDER — CARVEDILOL 3.125 MG PO TABS: 3.1250 mg | ORAL_TABLET | Freq: Two times a day (BID) | ORAL | 3 refills | Status: DC

## 2021-07-29 NOTE — Progress Notes (Signed)
Chief Complaint  Patient presents with   Follow-up    6 mon, had back procedure since last visit and doing well. Had covid back in march and tooth extraction back on monday   F/u  1. Htn controlled on spironolactone 25 mg qd, coreg 3.125 mg bid, clonidine 0.1 bid, lis 40 mg qd 2. Overactive bladder wants to try medication     Review of Systems  Constitutional:  Negative for weight loss.  HENT:  Negative for hearing loss.   Eyes:  Negative for blurred vision.  Respiratory:  Negative for shortness of breath.   Cardiovascular:  Negative for chest pain.  Gastrointestinal:  Negative for abdominal pain and blood in stool.  Genitourinary:  Negative for dysuria.  Musculoskeletal:  Negative for falls and joint pain.  Skin:  Negative for rash.  Neurological:  Negative for headaches.  Psychiatric/Behavioral:  Negative for depression.    Past Medical History:  Diagnosis Date   Arthritis    Chronic low back pain    Complication of anesthesia    "All my muscles were sore all over my body was sore the next day.. due to mixture of gases"   COVID-19    03/2021   Endometrial cancer (HCC)    GERD (gastroesophageal reflux disease)    Heart murmur 2018   tiney murmur, no treatment needed   Hypertension    Hyponatremia    chronic    Osteopenia    Prediabetes    RLS (restless legs syndrome)    Ulcer of the stomach caused by bacteria (H. pylori)    history of   Past Surgical History:  Procedure Laterality Date   ABDOMINAL HYSTERECTOMY  1992   BACK SURGERY  1989   L3, 4 and 5   DILATION AND CURETTAGE OF UTERUS  1992   KNEE ARTHROPLASTY Left 08/09/2016   Procedure: COMPUTER ASSISTED TOTAL KNEE ARTHROPLASTY;  Surgeon: Dereck Leep, MD;  Location: [facility] ORS;  Service: Orthopedics;  Laterality: Left;   KNEE ARTHROSCOPY W/ MENISCAL REPAIR Left    Family History  Problem Relation Age of Onset   Breast cancer Daughter 66   Aneurysm 69        brain age 81 y.o died 08/24/2019    Alzheimer's disease Father    Social History   Socioeconomic History   Marital status: Widowed    Spouse name: Not on file   Number of children: Not on file   Years of education: Not on file   Highest education level: Not on file  Occupational History   Not on file  Tobacco Use   Smoking status: Never   Smokeless tobacco: Never  Vaping Use   Vaping Use: Never used  Substance and Sexual Activity   Alcohol use: Yes    Comment: a glass of wine 2-3 times a year   Drug use: No   Sexual activity: Not Currently  Other Topics Concern   Not on file  Social History Narrative   Married lives with husband Kaylyn Garrow who is DPR    Husband died 01-24-2020   03-27-19 married 24 years    Social Determinants of Health   Financial Resource Strain: [place]  (04/19/2021)   Overall Financial Resource Strain (CARDIA)    Difficulty of Paying Living Expenses: Not hard at all  Food Insecurity: No Food Insecurity (04/19/2021)   Hunger Vital Sign    Worried About Running Out of Food in the Last Year: Never true    Ran  Out of Food in the Last Year: Never true  Transportation Needs: No Transportation Needs (04/19/2021)   PRAPARE - Hydrologist (Medical): No    Lack of Transportation (Non-Medical): No  Physical Activity: Not on file  Stress: No Stress Concern Present (04/19/2021)   [place]    Feeling of Stress : Not at all  Social Connections: Unknown (04/19/2021)   Social Connection and Isolation Panel [NHANES]    Frequency of Communication with Friends and Family: More than three times a week    Frequency of Social Gatherings with Friends and Family: More than three times a week    Attends Religious Services: Not on file    Active Member of Clubs or Organizations: Yes    Attends Archivist Meetings: 1 to 4 times per year    Marital Status: Widowed  Intimate Partner Violence: Not At Risk  (04/19/2021)   Humiliation, Afraid, Rape, and Kick questionnaire    Fear of Current or Ex-Partner: No    Emotionally Abused: No    Physically Abused: No    Sexually Abused: No   Current Meds  Medication Sig   acetaminophen (TYLENOL) 650 MG CR tablet Take 1,300 mg by mouth every 8 (eight) hours as needed for pain.   Calcium Carbonate Antacid (TUMS PO) Take by mouth.   Calcium Carbonate-Vitamin D3 600-400 MG-UNIT TABS Take 2 tablets by mouth daily.    calcium elemental as carbonate (BARIATRIC TUMS ULTRA) 400 MG chewable tablet Chew 1,000 mg by mouth as needed for heartburn.   ezetimibe (ZETIA) 10 MG tablet TAKE 1 TABLET EVERY DAY   sodium chloride (OCEAN) 0.65 % SOLN nasal spray Place 2 sprays into both nostrils as needed for congestion.   Vibegron (GEMTESA) 75 MG TABS Take 1 tablet by mouth daily.   VITAMIN D PO Take 2,000 Units by mouth daily.    [DISCONTINUED] carvedilol (COREG) 3.125 MG tablet Take 1 tablet (3.125 mg total) by mouth 2 (two) times daily with a meal.   [DISCONTINUED] cloNIDine (CATAPRES) 0.1 MG tablet TAKE 1 TABLET (0.1 MG TOTAL) BY MOUTH IN THE MORNING AND AT BEDTIME.   [DISCONTINUED] esomeprazole (NEXIUM) 20 MG capsule TAKE 1 CAPSULE DAILY AT 12 NOON   [DISCONTINUED] lisinopril (ZESTRIL) 40 MG tablet TAKE 1 TABLET EVERY DAY   [DISCONTINUED] spironolactone (ALDACTONE) 25 MG tablet Take 1 tablet (25 mg total) by mouth daily. In am See change in sig 1x per day   Allergies  Allergen Reactions   Amlodipine Swelling   Hydralazine     Rash   Hydralazine Hcl     Rash    Norvasc [Amlodipine Besylate]     5 mg leg swelling     Procardia Xl [Nifedipine Er]     Leg swelling/ankle    Epinephrine Anxiety    Hyperactivity   Ibuprofen Other (See Comments)    Cannot take due to GI ulcer   Lovastatin Diarrhea, Nausea Only and Nausea And Vomiting   Morphine Nausea Only   Tramadol Nausea Only    Caused nausea, "felt awful all over", and med did not help with pain "Felt  awful all over", and med did not help with pain   Zocor [Simvastatin] Diarrhea, Nausea And Vomiting and Nausea Only    Nausea and vomiting     No results found for this or any previous visit (from the past 2160 hour(s)). Objective  Body mass index is 33.57  kg/m. Wt Readings from Last 3 Encounters:  07/29/21 166 lb 3.2 oz (75.4 kg)  04/19/21 166 lb (75.3 kg)  01/28/21 166 lb (75.3 kg)   Temp Readings from Last 3 Encounters:  07/29/21 98 F (36.7 C) (Oral)  01/28/21 97.9 F (36.6 C)  12/08/20 97.8 F (36.6 C) (Temporal)   BP Readings from Last 3 Encounters:  07/29/21 130/80  01/28/21 122/70  12/08/20 140/70   Pulse Readings from Last 3 Encounters:  07/29/21 62  01/28/21 75  12/08/20 68    Physical Exam Vitals and nursing note reviewed.  Constitutional:      Appearance: Normal appearance. She is well-developed and well-groomed.  HENT:     Head: Normocephalic and atraumatic.  Eyes:     Conjunctiva/sclera: Conjunctivae normal.     Pupils: Pupils are equal, round, and reactive to light.  Cardiovascular:     Rate and Rhythm: Normal rate and regular rhythm.     Heart sounds: Normal heart sounds. No murmur heard. Pulmonary:     Effort: Pulmonary effort is normal.     Breath sounds: Normal breath sounds.  Abdominal:     General: Abdomen is flat. Bowel sounds are normal.     Tenderness: There is no abdominal tenderness.  Musculoskeletal:        General: No tenderness.  Skin:    General: Skin is warm and dry.  Neurological:     General: No focal deficit present.     Mental Status: She is alert and oriented to person, place, and time. Mental status is at baseline.     Cranial Nerves: Cranial nerves 2-12 are intact.     Motor: Motor function is intact.     Coordination: Coordination is intact.     Gait: Gait is intact.  Psychiatric:        Attention and Perception: Attention and perception normal.        Mood and Affect: Mood and affect normal.        Speech: Speech  normal.        Behavior: Behavior normal. Behavior is cooperative.        Thought Content: Thought content normal.        Cognition and Memory: Cognition and memory normal.        Judgment: Judgment normal.     Assessment  Plan  Hypertension, controlled - Plan: spironolactone (ALDACTONE) 25 MG tablet, Comprehensive metabolic panel, Lipid panel, CBC with Differential/Platelet, TSH coreg 3.125 mg bid, clonidine 0.1 bid, lis 40 mg qd Long term htn consider echo  Gastroesophageal reflux disease - Plan: esomeprazole (NEXIUM) 20 MG capsule   Urinary incontinence, unspecified type - Plan: Urinalysis, Routine w reflex microscopic, Urine Culture, Vibegron (GEMTESA) 75 MG TABS qd trial  Acute cystitis without hematuria  spironolactone (ALDACTONE) 25 MG tablet, coreg 3.125 mg bid, clonidine 0.1 bid, lis 40 mg qd   HM Flu shot utd Tdap had 10/18/10 due rx today  Consider shingrix  Given Rx has note had had zostervax prevnar x 1 utd and pna 23 vx utd covid 5/5   Osteopenia 12/31/14 dexa consider repeat in future ordered  04/03/19 dexa osteopenia   Mammogram 06/20/21 negative ordered    Colonoscopy dr. Tiffany Kocher h/o polyps and mom FH colon cancer -does not qualify cologuard -Obtained [facility] records to see if hemorrhoids Dr Tiffany Kocher 08/20/91 colonoscopy polyp sigmoid colon and IH with tubular adenoma path 08/20/91 EGD gastritis bx active chronic gastritis neg H pylori 09/14/94 colonoscopy normal except IH  Colonoscopy 01/20/00 polyp/colitis adenomatous polyp 03/26/03 colonoscopy h/o polyps 3 sessile polyps melanotic mucosa ileocecal valve path adenomatous and melanosis coli EGD 12/21/06 12 mm sessile polyp gastric antrum normal esophagus path moderate inactive chronic gastritis neg H pylori 09/07/06 colonoscopy IH, sessile polyp cecum 3 mm IH, small +, diverticulosis +tubular adenoma EGD 09/07/06 normal esophagus gastric ulcers with clean base, gastric polyp, gastritis path chronic inflammation chronic  gastritis   06/29/09 EGD HH, gastritis superficial vascular congestion neg H pylori   12/01/11 stomach bx antral mucosa with foveolar hyperplasia and mild superficial vascular congestion neg bx   11/30/11 colonoscopy IH small otherwise normal egd 11/30/11 normal enlarged gastric folds neg bx    Skin normal see above referred Dr.Dasher   Kidney Dr. Wallace Keller appt 12/30/2018 Dr. Fletcher Anon cards [facility] podiatry Dr. Elvina Mattes retired 10/2019  [facility] for shoulder/back epidural x 4 and 02/14/21 [facility] will do nerve ablation x 3 nerves    [facility] long term [facility] [place] [address] short term    Provider: Dr. Olivia Mackie McLean-Scocuzza-Internal Medicine

## 2021-07-29 NOTE — Patient Instructions (Addendum)
Dr. Volanda Napoleon will be coming and your new PCP  Vibegron Tablets/GEMTESA What is this medication? VIBEGRON (vye BEG ron) treats symptoms of an overactive bladder, such as loss of bladder control or frequent need to urinate. It works by relaxing muscles in the bladder. It belongs to a group of medications called antispasmodics. This medicine may be used for other purposes; ask your health care provider or pharmacist if you have questions. COMMON BRAND NAME(S): GEMTESA What should I tell my care team before I take this medication? They need to know if you have any of these conditions: Kidney disease Liver disease Prostate disease Trouble passing urine An unusual or allergic reaction to vibegron, other medications, foods, dyes, or preservatives Pregnant or trying to get pregnant Breast-feeding How should I use this medication? Take this medication by mouth with water. Take it as directed on the prescription label at the same time every day. Swallow the tablets whole. You may crush the tablet and mix with 1 tablespoon (15 mL) of applesauce. Swallow the medication and applesauce right away. Keep taking it unless your care team tells you to stop. You can take it with or without food. If it upsets your stomach, take it with food. Talk to your care team about the use of this medication in children. Special care may be needed. Overdosage: If you think you have taken too much of this medicine contact a poison control center or emergency room at once. NOTE: This medicine is only for you. Do not share this medicine with others. What if I miss a dose? If you miss a dose, take it as soon as you can. If it is almost time for your next dose, take only that dose. Do not take double or extra doses. What may interact with this medication? This medication may interact with the following: Digoxin This list may not describe all possible interactions. Give your health care provider a list of all the medicines, herbs,  non-prescription drugs, or dietary supplements you use. Also tell them if you smoke, drink alcohol, or use illegal drugs. Some items may interact with your medicine. What should I watch for while using this medication? Visit your care team for regular checks on your progress. Tell your care team if your symptoms do not start to get better or if they get worse. You may need to limit your intake of tea, coffee, caffeinated drinks, or alcohol. These drinks may make your symptoms worse. What side effects may I notice from receiving this medication? Side effects that you should report to your care team as soon as possible: Allergic reactions--skin rash, itching, hives, swelling of the face, lips, tongue, or throat Trouble passing urine Side effects that usually do not require medical attention (report to your care team if they continue or are bothersome): Diarrhea Headache Nausea Runny or stuffy nose Sore throat This list may not describe all possible side effects. Call your doctor for medical advice about side effects. You may report side effects to FDA at 1-800-FDA-1088. Where should I keep my medication? Keep out of the reach of children and pets. Store at room temperature between 20 and 25 degrees C (68 and 77 degrees F). Get rid of any unused medication after the expiration date. To get rid of medications that are no longer needed or have expired: Take the medication to a medication take-back program. Check with your pharmacy or law enforcement to find a location. If you cannot return the medication, check the label or package insert  to see if the medication should be thrown out in the garbage or flushed down the toilet. If you are not sure, ask your care team. If it is safe to put it in the trash, empty the medication out of the container. Mix the medication with cat litter, dirt, coffee grounds, or other unwanted substance. Seal the mixture in a bag or container. Put it in the trash. NOTE:  This sheet is a summary. It may not cover all possible information. If you have questions about this medicine, talk to your doctor, pharmacist, or health care provider.  2023 Elsevier/Gold Standard (2021-02-07 00:00:00)

## 2021-08-01 ENCOUNTER — Telehealth: Payer: Self-pay | Admitting: Internal Medicine

## 2021-08-01 NOTE — Telephone Encounter (Signed)
Pt called stating vibegron is to expensive for her and want to know if there are any alternatives that are cheaper

## 2021-08-03 MED ORDER — TROSPIUM CHLORIDE 20 MG PO TABS: 20.0000 mg | ORAL_TABLET | Freq: Two times a day (BID) | ORAL | 3 refills | Status: DC

## 2021-08-03 NOTE — Telephone Encounter (Signed)
Will try sanctura 1-2 x per day For overactive bladder

## 2021-08-03 NOTE — Addendum Note (Signed)
Addended by: Orland Mustard on: 08/03/2021 02:02 PM   Modules accepted: Orders

## 2021-08-04 ENCOUNTER — Telehealth: Payer: Self-pay

## 2021-08-04 ENCOUNTER — Other Ambulatory Visit (INDEPENDENT_AMBULATORY_CARE_PROVIDER_SITE_OTHER): Payer: Medicare PPO

## 2021-08-04 DIAGNOSIS — R32 Unspecified urinary incontinence: Secondary | ICD-10-CM

## 2021-08-04 DIAGNOSIS — I10 Essential (primary) hypertension: Secondary | ICD-10-CM | POA: Diagnosis not present

## 2021-08-04 LAB — COMPREHENSIVE METABOLIC PANEL
ALT: 14 U/L (ref 0–35)
AST: 21 U/L (ref 0–37)
Albumin: 4.3 g/dL (ref 3.5–5.2)
Alkaline Phosphatase: 55 U/L (ref 39–117)
BUN: 23 mg/dL (ref 6–23)
CO2: 26 mEq/L (ref 19–32)
Calcium: 10.2 mg/dL (ref 8.4–10.5)
Chloride: 97 mEq/L (ref 96–112)
Creatinine, Ser: 0.81 mg/dL (ref 0.40–1.20)
GFR: 65.37 mL/min (ref >60.00)
Glucose, Bld: 91 mg/dL (ref 70–99)
Potassium: 4.9 mEq/L (ref 3.5–5.1)
Sodium: 131 mEq/L — ABNORMAL LOW (ref 135–145)
Total Bilirubin: 0.5 mg/dL (ref 0.2–1.2)
Total Protein: 6.7 g/dL (ref 6.0–8.3)

## 2021-08-04 LAB — LIPID PANEL
Cholesterol: 193 mg/dL (ref 0–200)
HDL: 58 mg/dL (ref >39.00)
LDL Cholesterol: 98 mg/dL (ref 0–99)
NonHDL: 134.63
Total CHOL/HDL Ratio: 3
Triglycerides: 182 mg/dL — ABNORMAL HIGH (ref 0.0–149.0)
VLDL: 36.4 mg/dL (ref 0.0–40.0)

## 2021-08-04 LAB — CBC WITH DIFFERENTIAL/PLATELET
Basophils Absolute: 0 10*3/uL (ref 0.0–0.1)
Basophils Relative: 0.6 % (ref 0.0–3.0)
Eosinophils Absolute: 0.1 10*3/uL (ref 0.0–0.7)
Eosinophils Relative: 0.8 % (ref 0.0–5.0)
HCT: 42 % (ref 36.0–46.0)
Hemoglobin: 13.8 g/dL (ref 12.0–15.0)
Lymphocytes Relative: 38.7 % (ref 12.0–46.0)
Lymphs Abs: 2.5 10*3/uL (ref 0.7–4.0)
MCHC: 32.9 g/dL (ref 30.0–36.0)
MCV: 95.3 fl (ref 78.0–100.0)
Monocytes Absolute: 0.4 10*3/uL (ref 0.1–1.0)
Monocytes Relative: 6.6 % (ref 3.0–12.0)
Neutro Abs: 3.4 10*3/uL (ref 1.4–7.7)
Neutrophils Relative %: 53.3 % (ref 43.0–77.0)
Platelets: 232 10*3/uL (ref 150.0–400.0)
RBC: 4.4 Mil/uL (ref 3.87–5.11)
RDW: 13 % (ref 11.5–15.5)
WBC: 6.4 10*3/uL (ref 4.0–10.5)

## 2021-08-04 LAB — TSH: TSH: 1.35 u[IU]/mL (ref 0.35–5.50)

## 2021-08-04 NOTE — Telephone Encounter (Signed)
PA has been started on 08/04/21 via covermymeds for pt's Trospium Chloride '20MG'$  tablets  Key: [facility]  PA Case [id number]  Rx #:[id number]  Awaiting denial or approval

## 2021-08-05 ENCOUNTER — Other Ambulatory Visit: Payer: Self-pay | Admitting: Internal Medicine

## 2021-08-05 ENCOUNTER — Telehealth: Payer: Self-pay

## 2021-08-05 DIAGNOSIS — N3281 Overactive bladder: Secondary | ICD-10-CM

## 2021-08-05 LAB — URINALYSIS, ROUTINE W REFLEX MICROSCOPIC
Bilirubin Urine: NEGATIVE
Glucose, UA: NEGATIVE
Hgb urine dipstick: NEGATIVE
Ketones, ur: NEGATIVE
Leukocytes,Ua: NEGATIVE
Nitrite: NEGATIVE
Protein, ur: NEGATIVE
Specific Gravity, Urine: 1.017 (ref 1.001–1.035)
pH: 7 (ref 5.0–8.0)

## 2021-08-05 LAB — URINE CULTURE
MICRO NUMBER:: 13612211
Result:: NO GROWTH
SPECIMEN QUALITY:: ADEQUATE

## 2021-08-05 MED ORDER — TOLTERODINE TARTRATE ER 2 MG PO CP24: 2.0000 mg | ORAL_CAPSULE | Freq: Every day | ORAL | 0 refills | Status: DC

## 2021-08-05 NOTE — Telephone Encounter (Signed)
PA has been denied for pt's Trospium Chloride '20MG'$  tablets on 08/04/21  Per Insurance company: The drug you asked for is not listed in your preferred drug list (formulary). The preferred drug(s), you may not have tried are: oxybutynin chloride ER tablet,extended release 24 hr, tolterodine ER capsule,extended release 24 hr AND fesoterodine ER tablet,extended release 24 hr. Your provider needs to give [place] medical reasons why the preferred drug(s) would not work for you and/or would have bad side effects. Sometimes a preferred drug needs more review for approval. Additionally, some preferred drugs listed may be the same drugs with different strengths or forms. [employer] may only require one strength or form of that drug to be tried. This decision was from [employer] and [employer] Non-Formulary Exceptions Coverage Policy.

## 2021-08-05 NOTE — Telephone Encounter (Signed)
Lvm for pt to return call in regards to lab results.  Per Dr.Tracy: Cholesterol triglycerides slightly elevated  Rec healthy diet and exercise goal <150  Sodium 131 not worried about this  Thyroid lab normal  Blood cts normal  Urine normal

## 2021-10-06 DIAGNOSIS — Z96652 Presence of left artificial knee joint: Secondary | ICD-10-CM | POA: Diagnosis not present

## 2021-11-15 ENCOUNTER — Ambulatory Visit (INDEPENDENT_AMBULATORY_CARE_PROVIDER_SITE_OTHER): Payer: Medicare PPO | Admitting: Family Medicine

## 2021-11-15 ENCOUNTER — Encounter: Payer: Self-pay | Admitting: Family Medicine

## 2021-11-15 VITALS — BP 132/84 | HR 75 | Temp 98.2°F | Ht 59.0 in | Wt 167.2 lb

## 2021-11-15 DIAGNOSIS — Z789 Other specified health status: Secondary | ICD-10-CM | POA: Diagnosis not present

## 2021-11-15 DIAGNOSIS — Z23 Encounter for immunization: Secondary | ICD-10-CM

## 2021-11-15 DIAGNOSIS — I10 Essential (primary) hypertension: Secondary | ICD-10-CM

## 2021-11-15 DIAGNOSIS — N3281 Overactive bladder: Secondary | ICD-10-CM | POA: Diagnosis not present

## 2021-11-15 MED ORDER — SOLIFENACIN SUCCINATE 5 MG PO TABS: 5.0000 mg | ORAL_TABLET | Freq: Every day | ORAL | 1 refills | Status: DC

## 2021-11-15 NOTE — Progress Notes (Signed)
    SUBJECTIVE:   CHIEF COMPLAINT / HPI: transfer care  Patient presents to clinic to transfer care  No acute concerns today  HTN Has had difficulty in the past controlling BP.  Reports having seen cardiology and has found that this current medication works for her.  Reports BP at home <120/80.  Denies any visual changes, headaches, chest pain, palpitations or lower extremity swelling.  Endorses shortness of breath with exertion and attributes this to deconditioning.  Taking Clonidine, Coreg, Lisinopril and Aldactone.  Overactive bladder Taking Detrol LA 2 mg and has been effective.  Insurance has stopped covering and needs to switch to different medication.   PERTINENT  PMH / PSH:  HTN Overactive bladder Hyponatremia, chronic GERD    OBJECTIVE:   BP 132/84 (BP Location: Left Arm, Patient Position: Sitting, Cuff Size: Normal)   Pulse 75   Temp 98.2 F (36.8 C) (Oral)   Ht '4\' 11"'$  (1.499 m)   Wt 167 lb 3.2 oz (75.8 kg)   SpO2 99%   BMI 33.77 kg/m    General: Alert, no acute distress Cardio: Normal S1 and S2, RRR, no r/m/g Pulm: CTAB, normal work of breathing Abdomen: Bowel sounds normal. Abdomen soft and non-tender.  Extremities: No peripheral edema.  Neuro: Cranial nerves grossly intact   ASSESSMENT/PLAN:   Hypertension Asymptomatic. At goal for age. Tolerating medications.  Current medications include: Clonidine 0.1 mg BID, Lisinopril 40 mg daily, Carvedilol 3.125 mg BID and Aldactone 25 mg daily. -Will continue current medications -Would like to wean Clonidine to off or lowest dose if possible given increase with advancing age. Will continue to monitor for hypotensive events  Statin intolerance Not on statin secondary to side effects diarrhea, nausea and vomiting.  Reviewed recent labs LDL <100 -Continue Zetia 10 mg daily -Continue current lifestyle management   Overactive bladder Controlled with Detrol XL 2 mg daily.  Insurance will not cover medication  now. -Discontinue Detrol -Start Solifenacin 5 mg daily -Follow up in 1-2 months, if not improved can increase dose  HCM Flu vaccine today  PDMP Reviewed  Carollee Leitz, MD

## 2021-11-15 NOTE — Patient Instructions (Signed)
It was a pleasure meeting you today. Thank you for allowing me to take part in your health care.  Our goals for today as we discussed include:  For your blood pressure Your blood pressure was a little high today, 142/84.  The repeat blood pressure was 132/84.  Would like your blood pressure less than 120/80. Continue Clonidine 0.1 mg twice a day, may need to decrease this in future Continue Carvedilol 3.125 mg twice a day Continue Aldactone 25 mg daily May need to increase one of the above in this in future Continue Lisinopril 40 mg at night Recommend increasing physical activity such as aqua therapy.  Continue TOPS for weight loss management.    For your overactive bladder Will discontinue Detrol XL.  You can finish off the current prescription Start Solifenacin 5 mg daily Follow up with PCP in 1-2 months   Your hemoglobin and ferritin is normal meaning that your iron level is normal.  Stop taking Multivitamins with iron as this increases constipation.  The calcium you are taking may not be absorbed properly with the Nexium.  We can discuss switching to a one that will be better absorbed at your next visit.  You received the High dose Flu vaccine today  Please follow-up with PCP in 1-2 months  If you have any questions or concerns, please do not hesitate to call the office at [phone].  I look forward to our next visit and until then take care and stay safe.  Regards,   Carollee Leitz, MD   [facility]

## 2021-11-17 DIAGNOSIS — H2513 Age-related nuclear cataract, bilateral: Secondary | ICD-10-CM | POA: Diagnosis not present

## 2021-11-17 DIAGNOSIS — Z0100 Encounter for examination of eyes and vision without abnormal findings: Secondary | ICD-10-CM | POA: Diagnosis not present

## 2021-11-17 DIAGNOSIS — H43813 Vitreous degeneration, bilateral: Secondary | ICD-10-CM | POA: Diagnosis not present

## 2021-11-17 DIAGNOSIS — D3132 Benign neoplasm of left choroid: Secondary | ICD-10-CM | POA: Diagnosis not present

## 2021-11-21 ENCOUNTER — Encounter: Payer: Self-pay | Admitting: Family Medicine

## 2021-11-21 DIAGNOSIS — Z789 Other specified health status: Secondary | ICD-10-CM | POA: Insufficient documentation

## 2021-11-21 DIAGNOSIS — N3281 Overactive bladder: Secondary | ICD-10-CM | POA: Insufficient documentation

## 2021-11-21 NOTE — Assessment & Plan Note (Addendum)
Not on statin secondary to side effects diarrhea, nausea and vomiting.  Reviewed recent labs LDL <100 -Continue Zetia 10 mg daily -Continue current lifestyle management

## 2021-11-21 NOTE — Assessment & Plan Note (Signed)
Asymptomatic. At goal for age. Tolerating medications.  Current medications include: Clonidine 0.1 mg BID, Lisinopril 40 mg daily, Carvedilol 3.125 mg BID and Aldactone 25 mg daily. -Will continue current medications -Would like to wean Clonidine to off or lowest dose if possible given increase with advancing age. Will continue to monitor for hypotensive events

## 2021-11-21 NOTE — Assessment & Plan Note (Signed)
Controlled with Detrol XL 2 mg daily.  Insurance will not cover medication now. -Discontinue Detrol -Start Solifenacin 5 mg daily -Follow up in 1-2 months, if not improved can increase dose

## 2021-11-28 ENCOUNTER — Ambulatory Visit
Admission: RE | Admit: 2021-11-28 | Discharge: 2021-11-28 | Disposition: A | Discharge status: Home / Self Care | Payer: Medicare PPO | Source: Ambulatory Visit | Attending: Internal Medicine | Admitting: Internal Medicine

## 2021-11-28 DIAGNOSIS — R0600 Dyspnea, unspecified: Secondary | ICD-10-CM | POA: Diagnosis not present

## 2021-11-28 DIAGNOSIS — I10 Essential (primary) hypertension: Secondary | ICD-10-CM

## 2021-11-28 DIAGNOSIS — R0602 Shortness of breath: Secondary | ICD-10-CM

## 2021-11-28 DIAGNOSIS — I083 Combined rheumatic disorders of mitral, aortic and tricuspid valves: Secondary | ICD-10-CM | POA: Diagnosis not present

## 2021-11-28 LAB — ECHOCARDIOGRAM COMPLETE
AR max vel: 2.08 cm2
AV Area VTI: 2.14 cm2
AV Area mean vel: 2.05 cm2
AV Mean grad: 3.5 mmHg
AV Peak grad: 6.4 mmHg
Ao pk vel: 1.27 m/s
Area-P 1/2: 2.04 cm2
P 1/2 time: 519 msec
S' Lateral: 2.7 cm

## 2021-11-28 NOTE — Progress Notes (Signed)
*  PRELIMINARY RESULTS* Echocardiogram 2D Echocardiogram has been performed.  Jill Stanley 11/28/2021, 10:36 AM

## 2022-01-17 ENCOUNTER — Encounter: Payer: Self-pay | Admitting: Family Medicine

## 2022-01-17 ENCOUNTER — Ambulatory Visit (INDEPENDENT_AMBULATORY_CARE_PROVIDER_SITE_OTHER): Payer: Medicare PPO | Admitting: Family Medicine

## 2022-01-17 VITALS — BP 134/78 | HR 59 | Temp 97.0°F | Ht 59.0 in | Wt 168.0 lb

## 2022-01-17 DIAGNOSIS — I10 Essential (primary) hypertension: Secondary | ICD-10-CM

## 2022-01-17 DIAGNOSIS — N3281 Overactive bladder: Secondary | ICD-10-CM | POA: Diagnosis not present

## 2022-01-17 NOTE — Patient Instructions (Signed)
It was a pleasure meeting you today. Thank you for allowing me to take part in your health care.  Our goals for today as we discussed include:  For your blood pressure You previously had an ultrasound of your kidneys that did not show any narrowing of the renal arteries.  You are previously seen by Dr. Fletcher Anon,  cardiology.  I think it is best if we resend referral to follow-up with him for continued blood pressure management.  Limit salt intake Continue activity and healthy choices.   If you have any questions or concerns, please do not hesitate to call the office at [phone].  I look forward to our next visit and until then take care and stay safe.  Regards,   Carollee Leitz, MD   [facility]

## 2022-01-17 NOTE — Assessment & Plan Note (Addendum)
Initially elevated 140/86 and slightly lowered on repeat check.  Currently asymptomatic.  Tolerating medications.  Current medications include: Clonidine 0.1 mg BID, Lisinopril 40 mg daily, Carvedilol 3.125 mg BID and Aldactone 25 mg daily.  Previous renal artery study evidence of renal artery stenosis. -Continue current medications -Refer to hypertensive clinic  -Would like to wean Clonidine to off or lowest dose if possible given increase with advancing age.

## 2022-01-17 NOTE — Assessment & Plan Note (Signed)
Doing well on new medication. Continue Vesicare 5 mg daily.

## 2022-01-17 NOTE — Progress Notes (Signed)
   SUBJECTIVE:   Chief Complaint  Patient presents with   Hypertension   HPI Presents for follow up for elevated blood pressure. Seen in clinic on 10/17 for transfer of care.  BP at that time 132/84.  We discussed weaning clonidine if possible however blood pressure remains elevated at home 130s over 80s.  Today blood pressure 140/86.  Denies any visual changes, headaches, chest pain or worsening shortness of breath.  Tries to remain active.  She recalls having seen a cardiologist in the past and nephrology.  Has not followed up with either.   Overactive bladder. Reports new medication working well.  Would like to continue at current dose.  PERTINENT PMH / PSH: Resistant hypertension Aortic atherosclerosis   OBJECTIVE:  BP 134/78   Pulse (!) 59   Temp (!) 97 F (36.1 C)   Ht '4\' 11"'$  (1.499 m)   Wt 168 lb (76.2 kg)   SpO2 99%   BMI 33.93 kg/m    Physical Exam Vitals reviewed.  Constitutional:      General: She is not in acute distress.    Appearance: Normal appearance. She is normal weight. She is not ill-appearing, toxic-appearing or diaphoretic.  Eyes:     General:        Right eye: No discharge.        Left eye: No discharge.     Conjunctiva/sclera: Conjunctivae normal.  Cardiovascular:     Rate and Rhythm: Regular rhythm. Bradycardia present.     Pulses: Normal pulses.     Heart sounds: Normal heart sounds.  Pulmonary:     Effort: Pulmonary effort is normal.     Breath sounds: Normal breath sounds.  Abdominal:     General: Bowel sounds are normal.  Musculoskeletal:        General: Normal range of motion.  Skin:    General: Skin is warm and dry.  Neurological:     General: No focal deficit present.     Mental Status: She is alert and oriented to person, place, and time. Mental status is at baseline.  Psychiatric:        Mood and Affect: Mood normal.        Behavior: Behavior normal.        Thought Content: Thought content normal.        Judgment: Judgment  normal.     ASSESSMENT/PLAN:  Primary hypertension Assessment & Plan: Initially elevated 140/86 and slightly lowered on repeat check.  Currently asymptomatic.  Tolerating medications.  Current medications include: Clonidine 0.1 mg BID, Lisinopril 40 mg daily, Carvedilol 3.125 mg BID and Aldactone 25 mg daily.  Previous renal artery study evidence of renal artery stenosis. -Continue current medications -Refer to hypertensive clinic  -Would like to wean Clonidine to off or lowest dose if possible given increase with advancing age.  Orders: -     AMB REFERRAL TO ADVANCED HTN CLINIC  Overactive bladder Assessment & Plan: Doing well on new medication. Continue Vesicare 5 mg daily.    PDMP reviewed  No follow-ups on file.  Carollee Leitz, MD

## 2022-01-27 ENCOUNTER — Other Ambulatory Visit: Payer: Self-pay

## 2022-01-27 ENCOUNTER — Telehealth: Payer: Self-pay | Admitting: Family Medicine

## 2022-01-27 MED ORDER — SOLIFENACIN SUCCINATE 5 MG PO TABS: 5.0000 mg | ORAL_TABLET | Freq: Every day | ORAL | 1 refills | Status: DC

## 2022-01-27 NOTE — Telephone Encounter (Signed)
Pt need refill on solifenacin sent to [facility]

## 2022-01-27 NOTE — Telephone Encounter (Signed)
Prescription sent to [facility]

## 2022-02-24 NOTE — Progress Notes (Signed)
Advanced Hypertension Clinic Initial Assessment:    Date:  02/27/2022   ID:  Jill Stanley, DOB 1932/07/05, MRN [id number]  PCP:  [name], MD  Cardiologist:  Chilton Si, MD  Nephrologist:  Referring MD: [name], MD   CC: Hypertension  History of Present Illness:    Jill Stanley is a 89 y.o. female with a hx of hypertension, hyperlipidemia, prediabetes, and aortic atherosclerosis, here to establish care in the Advanced Hypertension Clinic. She previously saw Dr. Kirke Corin for uncontrolled hypertension in 2020. Renal artery dopplers 07/2018 were normal. She did not tolerate amlodipine due to edema and HCTZ caused hyponatremia. She did not tolerate hydralazine. When she saw Dr. Kirke Corin she was on a clonidine patch, carvedilol, and lisinopril. He noted that her home blood pressures were always better than in the office. He added spironolactone. She had an echo 10/2021 that revealed LVEF 55-60% with grade 1 diastolic dysfunction. She saw her PCP 12/2021 and BP was 134/78 on repeat. She was tolerating her regimen well, but Dr. Clent Ridges wanted to work on weaning the clonidine due to her age, and referred her to Advanced Hypertension clinic.  Today, she states she is feeling better. Last Thursday she developed a discomfort in her throat with a dry cough and congestion. Otherwise she feels well aside from chronic orthopedic issues including back pain and arthritis. In clinic today her blood pressure is 173/79 (180/74 on recheck), which she would attribute to white coat hypertension. At home her blood pressures have been closer to 130/76 on average. Additionally she complains of dryness in her eyes and throat, although she notes this may be side effects of Vesicare which she recently started. She denies any side effects from her antihypertensives. For exercise she goes on walks; she has more difficulty with standing still due to her chronic pain. Regarding her diet she orders out maybe once a  week. She adds salt to her home-cooked meals. Normally 1 cup of coffee a day. Alcohol consumption is very rare, usually with special occasions. Previously her husband told her that she snored. Typically she wakes up around 7 AM and takes her medication with food. She then returns to sleep for about an hour. Once in a while if she is bored she may fall asleep. She denies any palpitations, chest pain, shortness of breath, or peripheral edema. No lightheadedness, headaches, syncope, orthopnea, or PND.  Previous antihypertensives: Hydralazine Amlodipine - edema HCTZ - hyponatremia Lisinopril/HCTZ  Past Medical History:  Diagnosis Date   Anal pain 03/28/2019   Arthritis    Chronic gastritis without bleeding 07/29/2020   Chronic low back pain    Complication of anesthesia    "All my muscles were sore all over my body was sore the next day.. due to mixture of gases"   COVID-19    03/2021   Endometrial cancer (HCC)    Epigastric pain 07/29/2020   GERD (gastroesophageal reflux disease)    Heart murmur 2018   tiney murmur, no treatment needed   Hypertension    Hyponatremia    chronic    NONSPECIFIC ABN FINDING RAD & OTH EXAM GI TRACT 07/06/2009   Qualifier: Diagnosis of  By: Melvyn Neth CMA (AAMA), Patty     Osteopenia    Prediabetes    Right foot pain 03/28/2019   RLS (restless legs syndrome)    S/P total knee arthroplasty 08/09/2016   Ulcer of the stomach caused by bacteria (H. pylori)    history of   Ulcer  of the stomach caused by bacteria (H. pylori)    history of    Past Surgical History:  Procedure Laterality Date   ABDOMINAL HYSTERECTOMY  1992   BACK SURGERY  1989   L3, 4 and 5   DILATION AND CURETTAGE OF UTERUS  1992   KNEE ARTHROPLASTY Left 08/09/2016   Procedure: COMPUTER ASSISTED TOTAL KNEE ARTHROPLASTY;  Surgeon: Donato Heinz, MD;  Location: [facility] ORS;  Service: Orthopedics;  Laterality: Left;   KNEE ARTHROSCOPY W/ MENISCAL REPAIR Left     Current Medications: Current  Meds  Medication Sig   acetaminophen (TYLENOL) 650 MG CR tablet Take 1,300 mg by mouth every 8 (eight) hours as needed for pain.   Calcium Carbonate Antacid (TUMS PO) Take by mouth.   Calcium Carbonate-Vitamin D3 600-400 MG-UNIT TABS Take 2 tablets by mouth daily.    calcium elemental as carbonate (BARIATRIC TUMS ULTRA) 400 MG chewable tablet Chew 1,000 mg by mouth as needed for heartburn.   carvedilol (COREG) 3.125 MG tablet Take 1 tablet (3.125 mg total) by mouth 2 (two) times daily with a meal.   cloNIDine (CATAPRES) 0.1 MG tablet Take 1 tablet (0.1 mg total) by mouth in the morning and at bedtime.   cyanocobalamin (VITAMIN B12) 250 MCG tablet Take 250 mcg by mouth daily.   esomeprazole (NEXIUM) 20 MG capsule TAKE 1 CAPSULE DAILY AT 12 NOON   ezetimibe (ZETIA) 10 MG tablet TAKE 1 TABLET EVERY DAY   lisinopril (ZESTRIL) 40 MG tablet Take 1 tablet (40 mg total) by mouth daily.   sodium chloride (OCEAN) 0.65 % SOLN nasal spray Place 2 sprays into both nostrils as needed for congestion.   solifenacin (VESICARE) 5 MG tablet Take 1 tablet (5 mg total) by mouth daily.   spironolactone (ALDACTONE) 25 MG tablet Take 1 tablet (25 mg total) by mouth daily. In am See change in sig 1x per day   VITAMIN D PO Take 2,000 Units by mouth daily.      Allergies:   Amlodipine, Hydralazine, Hydralazine hcl, Norvasc [amlodipine besylate], Procardia xl [nifedipine er], Epinephrine, Ibuprofen, Lovastatin, Morphine, Tramadol, and Zocor [simvastatin]   Social History   Socioeconomic History   Marital status: Widowed    Spouse name: Not on file   Number of children: Not on file   Years of education: Not on file   Highest education level: Not on file  Occupational History   Not on file  Tobacco Use   Smoking status: Never   Smokeless tobacco: Never  Vaping Use   Vaping Use: Never used  Substance and Sexual Activity   Alcohol use: Yes    Comment: a glass of wine 2-3 times a year   Drug use: No   Sexual  activity: Not Currently  Other Topics Concern   Not on file  Social History Narrative   Married lives with husband Maribela Kennedy who is DPR    Husband died 02-06-20   04/09/19 married 65 years    Social Determinants of Health   Financial Resource Strain: Low Risk  (04/19/2021)   Overall Financial Resource Strain (CARDIA)    Difficulty of Paying Living Expenses: Not hard at all  Food Insecurity: No Food Insecurity (04/19/2021)   Hunger Vital Sign    Worried About Running Out of Food in the Last Year: Never true    Ran Out of Food in the Last Year: Never true  Transportation Needs: No Transportation Needs (04/19/2021)   PRAPARE - Transportation  Lack of Transportation (Medical): No    Lack of Transportation (Non-Medical): No  Physical Activity: Inactive (02/27/2022)   Exercise Vital Sign    Days of Exercise per Week: 0 days    Minutes of Exercise per Session: 0 min  Stress: No Stress Concern Present (04/19/2021)   [employer] of Occupational Health - Occupational Stress Questionnaire    Feeling of Stress : Not at all  Social Connections: Unknown (04/19/2021)   Social Connection and Isolation Panel [NHANES]    Frequency of Communication with Friends and Family: More than three times a week    Frequency of Social Gatherings with Friends and Family: More than three times a week    Attends Religious Services: Not on file    Active Member of Clubs or Organizations: Yes    Attends Banker Meetings: 1 to 4 times per year    Marital Status: Widowed     Family History: The patient's family history includes Alzheimer's disease in her father; Aneurysm in her grandson; Breast cancer (age of onset: 25) in her daughter.  ROS:   Please see the history of present illness.    (+) Dry cough, congestion (+) Dry eyes, throat (+) Chronic back pain (+) Arthralgias (+) Snoring All other systems reviewed and are negative.  EKGs/Labs/Other Studies Reviewed:    Echocardiogram   11/28/2021: Sonographer Comments: Suboptimal apical window and no subcostal window.   IMPRESSIONS   1. Left ventricular ejection fraction, by estimation, is 55 to 60%. The  left ventricle has normal function. The left ventricle has no regional  wall motion abnormalities. Left ventricular diastolic parameters are  consistent with Grade I diastolic  dysfunction (impaired relaxation).   2. Right ventricular systolic function is normal. The right ventricular  size is normal. Tricuspid regurgitation signal is inadequate for assessing  PA pressure.   3. Left atrial size was mildly dilated.   4. The mitral valve is normal in structure. Mild mitral valve  regurgitation. No evidence of mitral stenosis.   5. The aortic valve is normal in structure. Aortic valve regurgitation is  mild. Aortic valve sclerosis is present, with no evidence of aortic valve  stenosis.   Renal Artery Dopplers  08/21/2018: IMPRESSION: No evidence to suggest renal artery stenosis bilaterally.  EKG:  EKG is personally reviewed. 02/27/2022: Sinus rhythm. Rate 67 bpm. Low voltage.  Recent Labs: 08/04/2021: ALT 14; BUN 23; Creatinine, Ser 0.81; Hemoglobin 13.8; Platelets 232.0; Potassium 4.9; Sodium 131; TSH 1.35   Recent Lipid Panel    Component Value Date/Time   CHOL 193 08/04/2021 0736   CHOL 253 (H) 08/09/2018 0854   TRIG 182.0 (H) 08/04/2021 0736   HDL 58.00 08/04/2021 0736   HDL 80 08/09/2018 0854   CHOLHDL 3 08/04/2021 0736   VLDL 36.4 08/04/2021 0736   LDLCALC 98 08/04/2021 0736   LDLCALC 154 (H) 08/09/2018 0854   LDLDIRECT 100.0 08/03/2020 0733    Physical Exam:    VS:  BP (!) 180/74 (BP Location: Right Arm, Patient Position: Sitting, Cuff Size: Normal)   Pulse 67   Ht 4\' 11"  (1.499 m)   Wt 168 lb 8 oz (76.4 kg)   BMI 34.03 kg/m  , BMI Body mass index is 34.03 kg/m. GENERAL:  Well appearing HEENT: Pupils equal round and reactive, fundi not visualized, oral mucosa unremarkable NECK:  No jugular  venous distention, waveform within normal limits, carotid upstroke brisk and symmetric, no bruits, no thyromegaly LUNGS:  Clear to auscultation bilaterally HEART:  RRR.  PMI not displaced or sustained,S1 and S2 within normal limits, no S3, no S4, no clicks, no rubs, no murmurs ABD:  Flat, positive bowel sounds normal in frequency in pitch, no bruits, no rebound, no guarding, no midline pulsatile mass, no hepatomegaly, no splenomegaly EXT:  2 plus pulses throughout, no edema, no cyanosis, no clubbing SKIN:  No rashes, no nodules NEURO:  Cranial nerves II through XII grossly intact, motor grossly intact throughout PSYCH:  Cognitively intact, oriented to person place and time   ASSESSMENT/PLAN:    Hypertension Blood pressure was very high in the office today.  This morning at home it was 130/76.  She was anxious about coming to a new appointment today and driving from [place] to [place].  Given that her blood pressures are generally controlled at home, will not make any changes at this time.  I have asked her to check her blood pressures twice daily and she was given an advance hypertension clinic booklet.  She will bring her readings and her machine to follow-up.  For now, continue carvedilol, clonidine, lisinopril, and spironolactone.  Ideally, she would not be on clonidine given her age.  However, there are not many other options based on her intolerances and low heart rates.  She is tolerating her medicine well so we will not make any changes at this time.  We will check a sleep study and encouraged her to start by going to the [facility].  Her goal is to get at least 150 minutes of exercise weekly.  Aortic atherosclerosis (HCC) Continue Zetia.  History of statin intolerance.  Consider alternative agents at follow-up.  LDL was 98 on 07/2021.  Hyponatremia Chronic hyponatremia.  Previously exacerbated by HCTZ.  Would not further titrate spironolactone.  She is asymptomatic.   Screening for  Secondary Hypertension:     02/27/2022   10:58 AM  Causes  Drugs/Herbals Screened     - Comments Some TV dinners at home.  Mostly eats at home.  One coffee daily.  Rare EtOH. No tobacco, no NSAIDS.  Renovascular HTN Screened     - Comments Normal renal Dopplers in 2020  Sleep Apnea Screened     - Comments check sleep study  Thyroid Disease Screened  Hyperaldosteronism Screened     - Comments Check renal and and aldosterone  Pheochromocytoma N/A  Cushing's Syndrome N/A  Hyperparathyroidism Screened  Coarctation of the Aorta Screened     - Comments BP symmetric  Compliance Screened    Relevant Labs/Studies:    Latest Ref Rng & Units 08/04/2021    7:36 AM 03/03/2021    8:42 AM 12/08/2020    4:36 PM  Basic Labs  Sodium 135 - 145 mEq/L 131  134  134   Potassium 3.5 - 5.1 mEq/L 4.9  4.5  4.2   Creatinine 0.40 - 1.20 mg/dL 1.61  0.96  0.45        Latest Ref Rng & Units 08/04/2021    7:36 AM 08/03/2020    7:33 AM  Thyroid   TSH 0.35 - 5.50 uIU/mL 1.35  1.75      Disposition:    FU with APP/PharmD in 1 month for the next 3 months.   FU with [place] Steidle C. Duke Salvia, MD, [facility] in 4 months.  Medication Adjustments/Labs and Tests Ordered: Current medicines are reviewed at length with the patient today.  Concerns regarding medicines are outlined above.   Orders Placed This Encounter  Procedures   Aldosterone + renin activity  w/ ratio   EKG 12-Lead   Itamar Sleep Study   No orders of the defined types were placed in this encounter.  I,Mathew Stumpf,acting as a Neurosurgeon for Chilton Si, MD.,have documented all relevant documentation on the behalf of Chilton Si, MD,as directed by  Chilton Si, MD while in the presence of Chilton Si, MD.  I, Prajwal Fellner C. Duke Salvia, MD have reviewed all documentation for this visit.  The documentation of the exam, diagnosis, procedures, and orders on 02/27/2022 are all accurate and complete.   Signed, Chilton Si, MD  02/27/2022 11:57  AM    [facility]

## 2022-02-27 ENCOUNTER — Encounter (HOSPITAL_BASED_OUTPATIENT_CLINIC_OR_DEPARTMENT_OTHER): Payer: Self-pay | Admitting: Cardiovascular Disease

## 2022-02-27 ENCOUNTER — Telehealth: Payer: Self-pay | Admitting: *Deleted

## 2022-02-27 ENCOUNTER — Ambulatory Visit (HOSPITAL_BASED_OUTPATIENT_CLINIC_OR_DEPARTMENT_OTHER): Payer: Medicare PPO | Admitting: Cardiovascular Disease

## 2022-02-27 ENCOUNTER — Encounter (HOSPITAL_BASED_OUTPATIENT_CLINIC_OR_DEPARTMENT_OTHER): Payer: Self-pay | Admitting: *Deleted

## 2022-02-27 VITALS — BP 180/74 | HR 67 | Ht 59.0 in | Wt 168.5 lb

## 2022-02-27 DIAGNOSIS — E871 Hypo-osmolality and hyponatremia: Secondary | ICD-10-CM | POA: Diagnosis not present

## 2022-02-27 DIAGNOSIS — R0683 Snoring: Secondary | ICD-10-CM

## 2022-02-27 DIAGNOSIS — I10 Essential (primary) hypertension: Secondary | ICD-10-CM

## 2022-02-27 DIAGNOSIS — R400 Somnolence: Secondary | ICD-10-CM

## 2022-02-27 NOTE — Patient Instructions (Addendum)
Medication Instructions:  Your physician recommends that you continue on your current medications as directed. Please refer to the Current Medication list given to you today.   Labwork: RENIN/ALDOSTERONE LEVEL TODAY   Testing/Procedures: [facility] HOME SLEEP STUDY WILL CALL YOU WITH CODE   Follow-Up: 03/30/2022 11:20 AM WITH Overton Mam NP   Any Other Special Instructions Will Be Listed Below (If Applicable). MONITOR YOUR BLOOD PRESSURE TWICE A DAY, LOG IN THE BOOK PROVIDED. BRING THE BOOK AND YOUR BLOOD PRESSURE MACHINE TO YOUR FOLLOW UP IN 1 MONTH   LOOK INTO [facility] IN [place] FOR EXERCISE  WatchPAT?  Is a FDA cleared portable home sleep study test that uses a watch and 3 points of contact to monitor 7 different channels, including your heart rate, oxygen saturations, body position, snoring, and chest motion.  The study is easy to use from the comfort of your own home and accurately detect sleep apnea.  Before bed, you attach the chest sensor, attached the sleep apnea bracelet to your nondominant hand, and attach the finger probe.  After the study, the raw data is downloaded from the watch and scored for apnea events.   For more information: [email or web address]  Patient Testing Instructions:  Do not put battery into the device until bedtime when you are ready to begin the test. Please call the support number if you need assistance after following the instructions below: 24 hour support line- [phone] or ITAMAR support at [phone] (option 2)  Download the [employer]WatchPAT One" app through the google play store or App Store  Be sure to turn on or enable access to bluetooth in settlings on your smartphone/ device  Make sure no other bluetooth devices are on and within the vicinity of your smartphone/ device and WatchPAT watch during testing.  Make sure to leave your smart phone/ device plugged in and charging all night.  When ready for bed:  Follow the  instructions step by step in the WatchPAT One App to activate the testing device. For additional instructions, including video instruction, visit the WatchPAT One video on Youtube. You can search for [place] One within Youtube (video is 4 minutes and 18 seconds) or enter: https://youtube/watch?v=BCce_vbiwxE Please note: You will be prompted to enter a Pin to connect via bluetooth when starting the test. The PIN will be assigned to you when you receive the test.  The device is disposable, but it recommended that you retain the device until you receive a call letting you know the study has been received and the results have been interpreted.  We will let you know if the study did not transmit to [place] properly after the test is completed. You do not need to call us to confirm the receipt of the test.  Please complete the test within 48 hours of receiving PIN.   Frequently Asked Questions:  What is Watch [place] one?  A single use fully disposable home sleep apnea testing device and will not need to be returned after completion.  What are the requirements to use WatchPAT one?  The be able to have a successful watchpat one sleep study, you should have your Watch pat one device, your smart phone, watch pat one app, your PIN number and Internet access What type of phone do I need?  You should have a smart phone that uses Android 5.1 and above or any Iphone with IOS 10 and above How can I download the WatchPAT one app?  Based on your device type search for [facility]  one app either in google play for android devices or APP store for Iphone's Where will I get my PIN for the study?  Your PIN will be provided by your physician's office. It is used for authentication and if you lose/forget your PIN, please reach out to your providers office.  I do not have Internet at home. Can I do WatchPAT one study?  WatchPAT One needs Internet connection throughout the night to be able to transmit the sleep data. You can use your  home/local internet or your cellular's data package. However, it is always recommended to use home/local Internet. It is estimated that between 20MB-30MB will be used with each study.However, the application will be looking for 80MB space in the phone to start the study.  What happens if I lose internet or bluetooth connection?  During the internet disconnection, your phone will not be able to transmit the sleep data. All the data, will be stored in your phone. As soon as the internet connection is back on, the phone will being sending the sleep data. During the bluetooth disconnection, WatchPAT one will not be able to to send the sleep data to your phone. Data will be kept in the [facility] one until two devices have bluetooth connection back on. As soon as the connection is back on, WatchPAT one will send the sleep data to the phone.  How long do I need to wear the WatchPAT one?  After you start the study, you should wear the device at least 6 hours.  How far should I keep my phone from the device?  During the night, your phone should be within 15 feet.  What happens if I leave the room for restroom or other reasons?  Leaving the room for any reason will not cause any problem. As soon as your get back to the room, both devices will reconnect and will continue to send the sleep data. Can I use my phone during the sleep study?  Yes, you can use your phone as usual during the study. But it is recommended to put your watchpat one on when you are ready to go to bed.  How will I get my study results?  A soon as you completed your study, your sleep data will be sent to the provider. They will then share the results with you when they are ready.

## 2022-02-27 NOTE — Telephone Encounter (Signed)
This encounter was created in error - please disregard.

## 2022-02-27 NOTE — Assessment & Plan Note (Signed)
>>  ASSESSMENT AND PLAN FOR HYPONATREMIA WRITTEN ON 02/27/2022 11:20 AM BY Elberon, TIFFANY, MD  Chronic hyponatremia.  Previously exacerbated by HCTZ.  Would not further titrate spironolactone .  She is asymptomatic.

## 2022-02-27 NOTE — Assessment & Plan Note (Signed)
Blood pressure was very high in the office today.  This morning at home it was 130/76.  She was anxious about coming to a new appointment today and driving from [place] to [place].  Given that her blood pressures are generally controlled at home, will not make any changes at this time.  I have asked her to check her blood pressures twice daily and she was given an advance hypertension clinic booklet.  She will bring her readings and her machine to follow-up.  For now, continue carvedilol, clonidine, lisinopril, and spironolactone.  Ideally, she would not be on clonidine given her age.  However, there are not many other options based on her intolerances and low heart rates.  She is tolerating her medicine well so we will not make any changes at this time.  We will check a sleep study and encouraged her to start by going to the [facility].  Her goal is to get at least 150 minutes of exercise weekly.

## 2022-02-27 NOTE — Telephone Encounter (Signed)
Spoke with patient, she stated that she only has limited minutes/data on her phone and does not think she can do Itamar on it Would like to have inpatient sleep test in [place] placed and will forward to sleep pool

## 2022-02-27 NOTE — Addendum Note (Signed)
Addended by: Alvina Filbert B on: 02/27/2022 05:55 PM   Modules accepted: Orders

## 2022-02-27 NOTE — Telephone Encounter (Signed)
Secure chat message sent to Melinda Pratt ok to activate itamar device. 

## 2022-02-27 NOTE — Assessment & Plan Note (Addendum)
Chronic hyponatremia.  Previously exacerbated by HCTZ.  Would not further titrate spironolactone.  She is asymptomatic.

## 2022-02-27 NOTE — Assessment & Plan Note (Signed)
Continue Zetia.  History of statin intolerance.  Consider alternative agents at follow-up.  LDL was 98 on 07/2021.

## 2022-03-05 LAB — ALDOSTERONE + RENIN ACTIVITY W/ RATIO
Aldos/Renin Ratio: 6 (ref 0.0–30.0)
Aldosterone: 8.9 ng/dL (ref 0.0–30.0)
Renin Activity, Plasma: 1.487 ng/mL/hr (ref 0.167–5.380)

## 2022-03-07 ENCOUNTER — Ambulatory Visit (INDEPENDENT_AMBULATORY_CARE_PROVIDER_SITE_OTHER): Payer: Medicare PPO

## 2022-03-07 ENCOUNTER — Encounter: Payer: Self-pay | Admitting: Family Medicine

## 2022-03-07 ENCOUNTER — Ambulatory Visit (INDEPENDENT_AMBULATORY_CARE_PROVIDER_SITE_OTHER): Payer: Medicare PPO | Admitting: Family Medicine

## 2022-03-07 VITALS — BP 128/80 | HR 59 | Temp 98.0°F | Ht 59.0 in | Wt 164.6 lb

## 2022-03-07 DIAGNOSIS — I700 Atherosclerosis of aorta: Secondary | ICD-10-CM

## 2022-03-07 DIAGNOSIS — J209 Acute bronchitis, unspecified: Secondary | ICD-10-CM | POA: Diagnosis not present

## 2022-03-07 DIAGNOSIS — R053 Chronic cough: Secondary | ICD-10-CM

## 2022-03-07 DIAGNOSIS — R059 Cough, unspecified: Secondary | ICD-10-CM | POA: Diagnosis not present

## 2022-03-07 DIAGNOSIS — I10 Essential (primary) hypertension: Secondary | ICD-10-CM

## 2022-03-07 DIAGNOSIS — J22 Unspecified acute lower respiratory infection: Secondary | ICD-10-CM

## 2022-03-07 MED ORDER — SACCHAROMYCES BOULARDII 250 MG PO CAPS: 250.0000 mg | ORAL_CAPSULE | Freq: Every day | ORAL | 0 refills | Status: DC

## 2022-03-07 MED ORDER — PREDNISONE 20 MG PO TABS: 20.0000 mg | ORAL_TABLET | Freq: Every day | ORAL | 0 refills | Status: AC

## 2022-03-07 MED ORDER — DOXYCYCLINE HYCLATE 100 MG PO TABS: 100.0000 mg | ORAL_TABLET | Freq: Two times a day (BID) | ORAL | 0 refills | Status: AC

## 2022-03-07 NOTE — Progress Notes (Signed)
SUBJECTIVE:   Chief Complaint  Patient presents with   Cough    Dry & Rattling cough Nose stopped up Chest congestion x 2 weeks   HPI Patient presents to clinic with persistent cough greater than 2 weeks.  Reports nasal congestion and dry rattling cough.  Unable to cough anything up.  Has tried Tussionex but not helping.  Cough worse in the evening and difficulty sleeping.  Also notes fatigue without muscle aches.  Denies any fever, chest pain, palpitations, wheezing, shortness of breath, pleuritic pain, headaches, sinus pressure, nausea/vomiting, diarrhea or urinary symptoms.  Endorses recent sick contact with same symptoms treated with antibiotics and steroids who improved.  No previous tobacco use.  Hypertension Asymptomatic.  Has been evaluated by hypertensive clinic given multiple antihypertensive.  No medication changes at that time.  Patient reports blood work and sleep study ordered.  Blood pressure well-controlled today.  PERTINENT PMH / PSH: Resistant hypertension Hyperlipidemia Obesity class I   OBJECTIVE:  BP 128/80   Pulse (!) 59   Temp 98 F (36.7 C)   Ht 4' 11"$  (1.499 m)   Wt 164 lb 9.6 oz (74.7 kg)   SpO2 99%   BMI 33.25 kg/m    Physical Exam Vitals reviewed.  Constitutional:      General: She is not in acute distress.    Appearance: Normal appearance. She is not ill-appearing, toxic-appearing or diaphoretic.  HENT:     Right Ear: Tympanic membrane, ear canal and external ear normal. There is no impacted cerumen.     Left Ear: Tympanic membrane, ear canal and external ear normal. There is no impacted cerumen.     Nose: Nose normal. No congestion or rhinorrhea.     Mouth/Throat:     Mouth: Mucous membranes are moist.     Pharynx: Oropharynx is clear. No oropharyngeal exudate or posterior oropharyngeal erythema.  Eyes:     General:        Right eye: No discharge.        Left eye: No discharge.     Conjunctiva/sclera: Conjunctivae normal.   Cardiovascular:     Rate and Rhythm: Normal rate and regular rhythm.     Heart sounds: Normal heart sounds.  Pulmonary:     Effort: Pulmonary effort is normal. No respiratory distress.     Breath sounds: Wheezing and rhonchi present.  Chest:     Chest wall: No tenderness.  Musculoskeletal:        General: Normal range of motion.  Lymphadenopathy:     Cervical: No cervical adenopathy.  Skin:    General: Skin is warm and dry.  Neurological:     General: No focal deficit present.     Mental Status: She is alert and oriented to person, place, and time. Mental status is at baseline.  Psychiatric:        Mood and Affect: Mood normal.        Behavior: Behavior normal.        Thought Content: Thought content normal.        Judgment: Judgment normal.     ASSESSMENT/PLAN:  Acute bronchitis, unspecified organism Assessment & Plan: Acute.  Suspect viral etiology given no fevers and recent contact with similar symptoms. In no acute respiratory distress.  Mild wheezing and rhonchi noted on lung exam Given ongoing for more than 2 weeks will treat with steroids and antibiotics     Orders: -     Respiratory virus panel -  DG Chest 2 View; Future -     predniSONE; Take 1 tablet (20 mg total) by mouth daily with breakfast for 5 days.  Dispense: 5 tablet; Refill: 0 -     Doxycycline Hyclate; Take 1 tablet (100 mg total) by mouth 2 (two) times daily for 5 days.  Dispense: 10 tablet; Refill: 0 -     Saccharomyces boulardii; Take 1 capsule (250 mg total) by mouth daily.  Dispense: 90 capsule; Refill: 0  Aortic atherosclerosis (HCC) Assessment & Plan: Chronic.  Stable. Last LDL 98, goal less than 100 Statin intolerance, currently on Zetia Repeating lipids at next visit.   Primary hypertension Assessment & Plan: Chronic.  Stable.  Well-controlled today.   Continue current medications. Continue follow-up with hypertensive clinic.     PDMP reviewed  Return if symptoms worsen or  fail to improve.  Carollee Leitz, MD

## 2022-03-07 NOTE — Patient Instructions (Addendum)
It was a pleasure meeting you today. Thank you for allowing me to take part in your health care.  Our goals for today as we discussed include:  Start Prednisone 20 mg daily for 5 days Start Doxycycline 100 mg 1 tablet 2 times a day for 5 days Start Probiotics daily and continue for at least 2 weeks after completion of antibiotics  If worsening symptoms call 911 or have someone take you to the Emergency Department  If you have any questions or concerns, please do not hesitate to call the office at [phone].  I look forward to our next visit and until then take care and stay safe.  Regards,   Carollee Leitz, MD   [facility]

## 2022-03-09 ENCOUNTER — Encounter: Payer: Self-pay | Admitting: Family Medicine

## 2022-03-09 LAB — RESPIRATORY VIRUS PANEL
Adenovirus B: NOT DETECTED
HUMAN PARAINFLU VIRUS 1: NOT DETECTED
HUMAN PARAINFLU VIRUS 2: NOT DETECTED
HUMAN PARAINFLU VIRUS 3: NOT DETECTED
INFLUENZA A SUBTYPE H1: NOT DETECTED
INFLUENZA A SUBTYPE H3: NOT DETECTED
Influenza A: NOT DETECTED
Influenza B: NOT DETECTED
Metapneumovirus: NOT DETECTED
Respiratory Syncytial Virus A: NOT DETECTED
Respiratory Syncytial Virus B: DETECTED — AB
Rhinovirus: NOT DETECTED

## 2022-03-10 ENCOUNTER — Telehealth: Payer: Self-pay | Admitting: *Deleted

## 2022-03-10 ENCOUNTER — Other Ambulatory Visit: Payer: Self-pay | Admitting: Cardiovascular Disease

## 2022-03-10 ENCOUNTER — Encounter: Payer: Self-pay | Admitting: Family Medicine

## 2022-03-10 ENCOUNTER — Telehealth (HOSPITAL_BASED_OUTPATIENT_CLINIC_OR_DEPARTMENT_OTHER): Payer: Self-pay | Admitting: Cardiovascular Disease

## 2022-03-10 DIAGNOSIS — I10 Essential (primary) hypertension: Secondary | ICD-10-CM

## 2022-03-10 DIAGNOSIS — R0683 Snoring: Secondary | ICD-10-CM

## 2022-03-10 DIAGNOSIS — R400 Somnolence: Secondary | ICD-10-CM

## 2022-03-10 DIAGNOSIS — J209 Acute bronchitis, unspecified: Secondary | ICD-10-CM | POA: Insufficient documentation

## 2022-03-10 NOTE — Assessment & Plan Note (Signed)
Chronic.  Stable.  Well-controlled today.   Continue current medications. Continue follow-up with hypertensive clinic.

## 2022-03-10 NOTE — Telephone Encounter (Signed)
Please contact Jerri at [phone] with new sleep study order

## 2022-03-10 NOTE — Assessment & Plan Note (Signed)
Chronic.  Stable. Last LDL 98, goal less than 100 Statin intolerance, currently on Zetia Repeating lipids at next visit.

## 2022-03-10 NOTE — Telephone Encounter (Signed)
Prior Authorization for split night sleep study sent to [facility] via web portal.  Request denied. States no co morbidities. Recommends HST. Per Dr Oval Linsey ok to switch to HST.

## 2022-03-10 NOTE — Telephone Encounter (Signed)
Opened in error

## 2022-03-10 NOTE — Assessment & Plan Note (Addendum)
Acute.  Suspect viral etiology given no fevers and recent contact with similar symptoms. In no acute respiratory distress.  Mild wheezing and rhonchi noted on lung exam Given ongoing for more than 2 weeks will treat with steroids and antibiotics

## 2022-03-10 NOTE — Telephone Encounter (Signed)
Returned a call to [employer] and was told that the initial denial was not correct. The split night study has been approved.  Auth # [id number]. Valid dates 03/13/22 to 06/11/22. Order has been changed back to split night study.

## 2022-03-16 NOTE — Telephone Encounter (Signed)
Pt called in staying that [place] had referred to a sleep study in [place], however per pt, she's unable to make it that far because of her age. She would like to have the sleep study done @[facility] . Any questions, she's available @336$ [phone].

## 2022-03-20 NOTE — Telephone Encounter (Signed)
S/w pt -  stated requested [place] from Dr Oval Linsey - waiting for call back

## 2022-03-30 ENCOUNTER — Ambulatory Visit (HOSPITAL_BASED_OUTPATIENT_CLINIC_OR_DEPARTMENT_OTHER): Payer: Medicare PPO | Admitting: Family

## 2022-04-20 ENCOUNTER — Ambulatory Visit (HOSPITAL_BASED_OUTPATIENT_CLINIC_OR_DEPARTMENT_OTHER): Payer: Medicare PPO | Admitting: Family

## 2022-05-02 ENCOUNTER — Other Ambulatory Visit: Payer: Self-pay | Admitting: Cardiovascular Disease

## 2022-05-02 DIAGNOSIS — G2581 Restless legs syndrome: Secondary | ICD-10-CM

## 2022-05-02 DIAGNOSIS — I10 Essential (primary) hypertension: Secondary | ICD-10-CM

## 2022-05-04 ENCOUNTER — Telehealth (HOSPITAL_BASED_OUTPATIENT_CLINIC_OR_DEPARTMENT_OTHER): Payer: Self-pay | Admitting: Cardiovascular Disease

## 2022-05-04 NOTE — Telephone Encounter (Signed)
New Message:      Patient wants Dr Oval Linsey to know that she does not wat the Sleep Study.

## 2022-05-04 NOTE — Telephone Encounter (Signed)
Deleted sleep study and will forward to Dr Oval Linsey for review

## 2022-05-14 IMAGING — MR MR LUMBAR SPINE W/O CM
6 of 9 series · 28 of 48 positions shown · non-contrast
Comparison: No pertinent prior exams are available for comparison.

CLINICAL DATA: Spinal stenosis of lumbar region without neurogenic
claudication. Additional history provided: 1-2 months of central and
left low back pain. Pain in left hip and down left thigh.

EXAM:
MRI LUMBAR SPINE WITHOUT CONTRAST
TECHNIQUE: Multiplanar, multisequence MR imaging of the lumbar spine was
performed. No intravenous contrast was administered.

[Series 5: T2 · sagittal · 4.0mm · 0.81mm/px · 4 of 17 slices shown (1 of 4)]
[im 1/17]
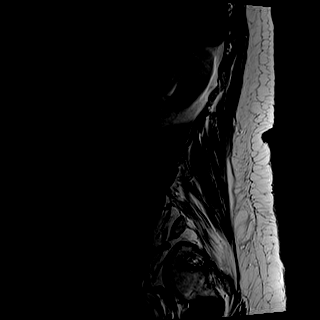
[im 6/17]
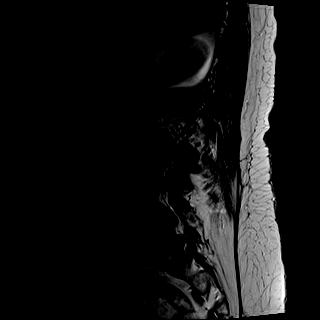
[im 11/17]
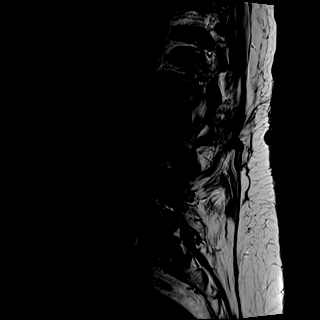
[im 17/17]
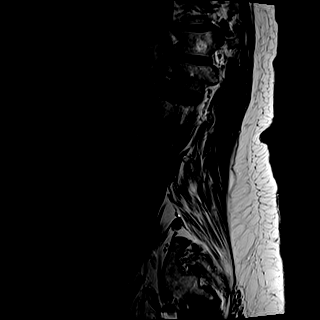

[Series 6: T1 · sagittal · 4.0mm · 0.81mm/px · 4 of 17 slices shown (1 of 2)]
[im 1/17]
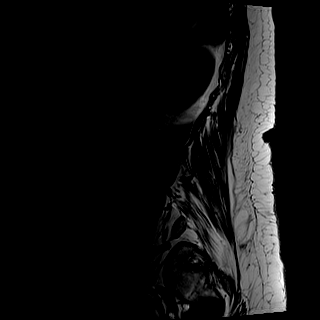
[im 6/17]
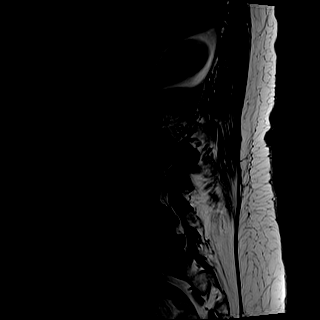
[im 11/17]
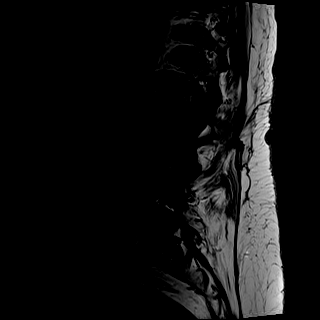
[im 17/17]
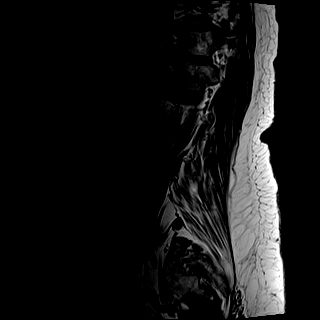

[Series 8: T2 · axial · 4.0mm · 0.52mm/px · z∈[+53,+172]mm · 6 of 25 slices shown (2 of 4)]
[im 1/25]
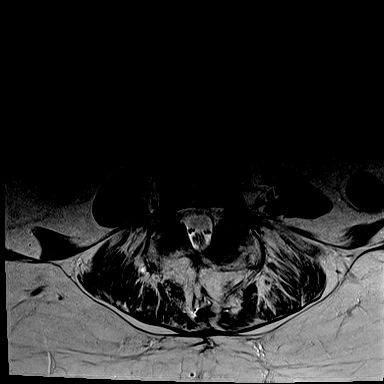
[im 5/25]
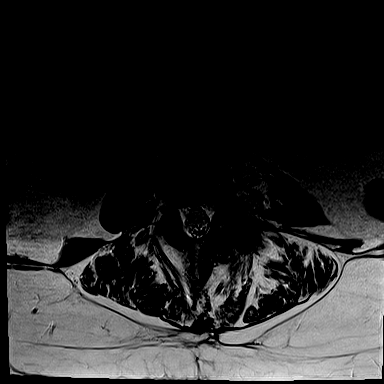
[im 10/25]
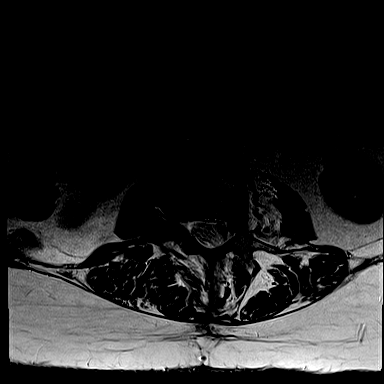
[im 15/25]
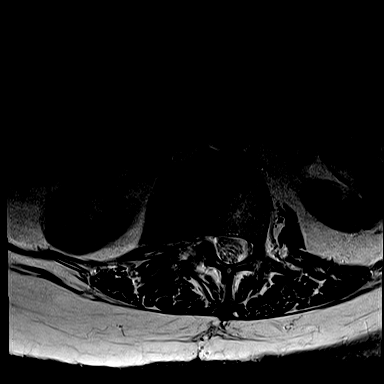
[im 20/25]
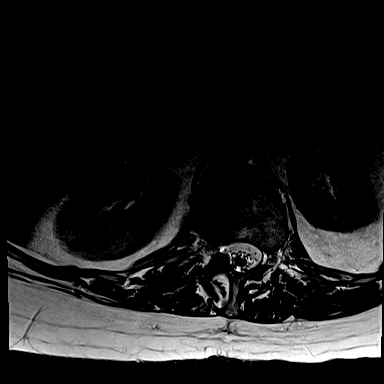
[im 25/25]
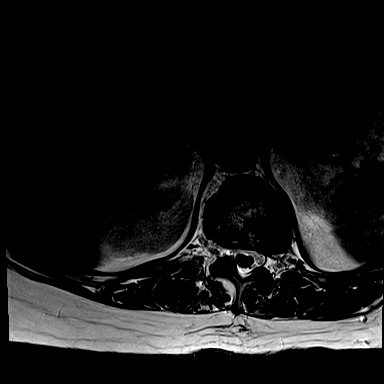

[Series 9: T2 · axial · 4.0mm · 0.52mm/px · z∈[-48,-5]mm · 3 of 10 slices shown (3 of 4)]
[im 1/10]
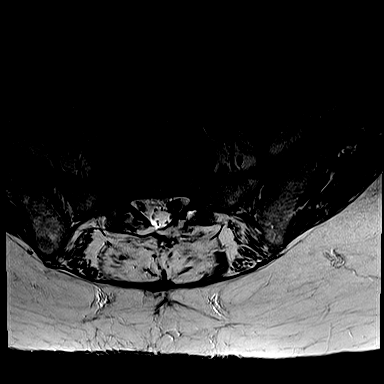
[im 5/10]
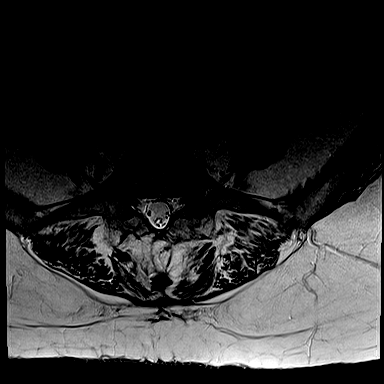
[im 10/10]
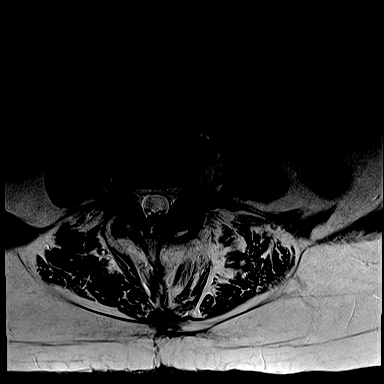

[Series 10: T2 · axial · 4.0mm · 0.52mm/px · z∈[-48,+172]mm · 9 of 35 slices shown (4 of 4)]
[im 1/35]
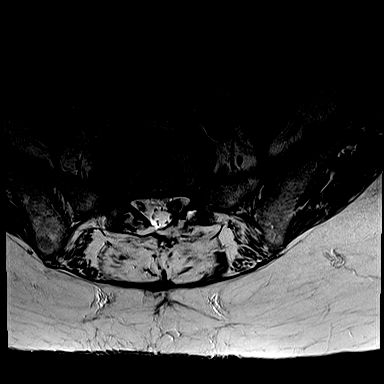
[im 5/35]
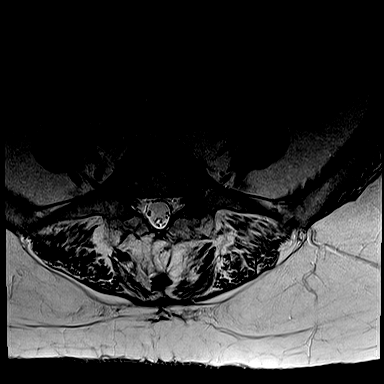
[im 9/35]
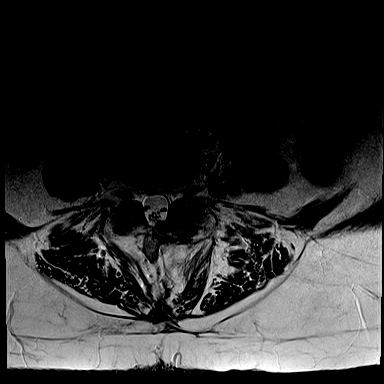
[im 13/35]
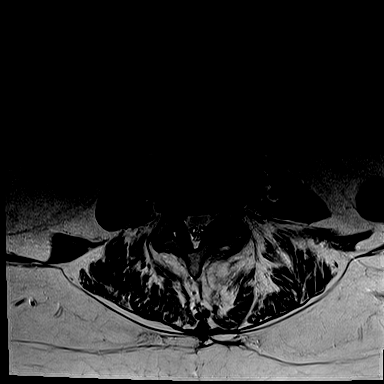
[im 18/35]
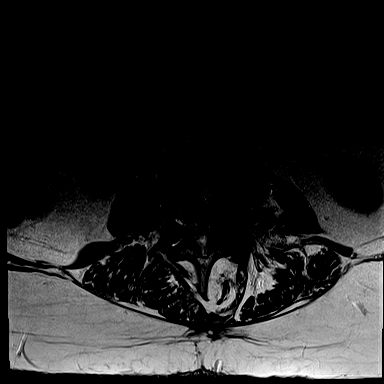
[im 22/35]
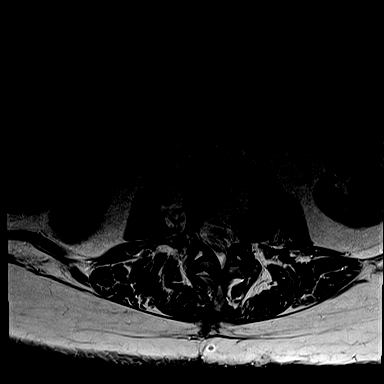
[im 26/35]
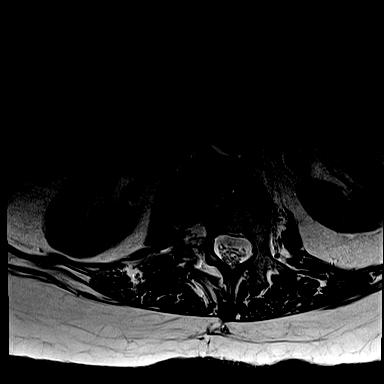
[im 30/35]
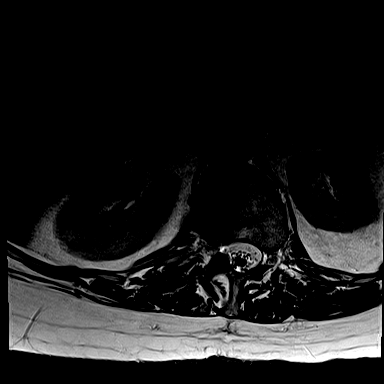
[im 35/35]
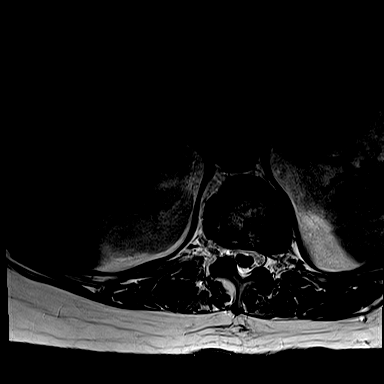

[Series 11: T1 · axial · 4.0mm · 0.31mm/px · z∈[+53,+73]mm · 2 of 25 slices shown (2 of 2)]
[im 1/25]
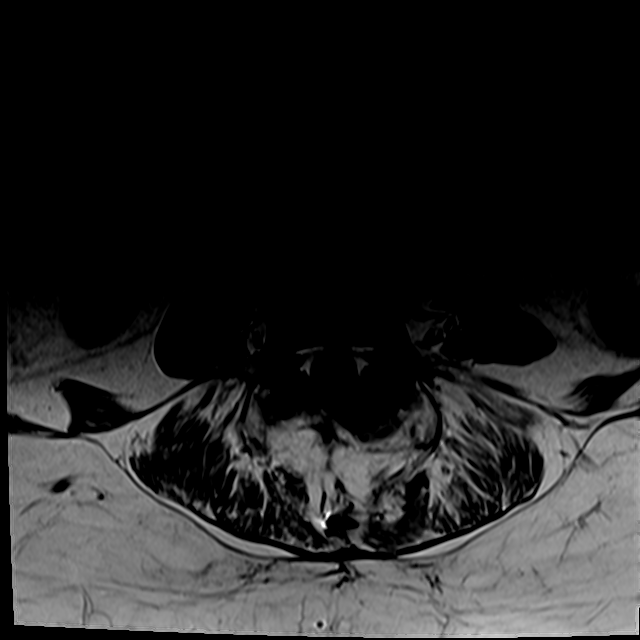
[im 5/25]
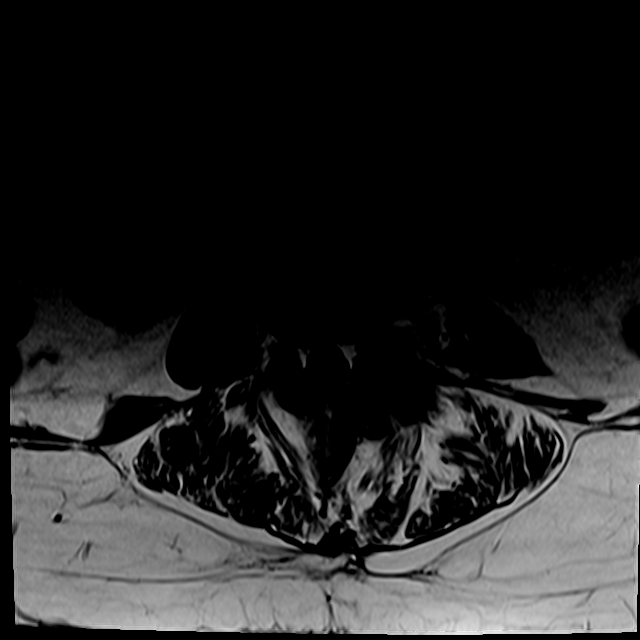

[28 of 48 positions shown; findings below may reference images not displayed]

FINDINGS: Segmentation: For the purposes of this dictation, five lumbar
vertebrae are assumed and the caudal most well-formed intervertebral
disc is designated L5-S1.

Alignment: Lower lumbar dextrocurvature. Mild T11-T12, T12-L1,
L1-L2, L2-L3 and L3-L4 grade 1 retrolisthesis. 4 mm L4-L5 grade 1
anterolisthesis.

Vertebrae: Vertebral body height is maintained. Mild multilevel
degenerative endplate edema greatest at T11-T12 and L2-L3. Small
multilevel vertebral body hemangiomas. Multilevel ventrolateral
osteophytes.

Conus medullaris and cauda equina: Conus extends to the L1-L2 level.
No signal abnormality within the visualized distal spinal cord.

Paraspinal and other soft tissues: No abnormality identified within
included portions of the abdomen/retroperitoneum. Atrophy of the
lumbar paraspinal musculature. Postsurgical changes to the dorsal
lumbar soft tissues.

Disc levels:

Advanced disc degeneration at the T11-T12 through L3-L4 levels.
Moderate disc degeneration at L4-L5 and L5-S1.

T10-T11: This level is imaged sagittally. Disc bulge with
superimposed small central disc protrusion. Facet
arthrosis/ligamentum flavum hypertrophy. No more than mild relative
spinal canal narrowing. No significant neural foraminal narrowing.

T11-T12: Grade 1 retrolisthesis. Disc uncovering with disc bulge.
Mild facet arthrosis. No significant spinal canal stenosis or neural
foraminal narrowing.

T12-L1: Grade 1 retrolisthesis. Shallow disc bulge. Associated
endplate spurring/osteophyte ridge, greatest along the right lateral
aspect of the disc space. Mild facet arthrosis. No significant
spinal canal stenosis. Mild right neural foraminal narrowing.

L1-L2: Grade 1 retrolisthesis. Disc uncovering with disc bulge. Mild
facet arthrosis/ligamentum flavum hypertrophy. Mild relative
bilateral subarticular and central canal narrowing. No significant
foraminal stenosis.

L2-L3: Grade 1 retrolisthesis. Disc uncovering with disc bulge.
Endplate spurring/osteophyte ridge greatest along the left lateral
aspect of the disc space. Moderate facet arthrosis/ligamentum flavum
hypertrophy. Left greater than right subarticular narrowing with
moderate central canal stenosis. Severe left neural foraminal
narrowing.

L3-L4: Grade 1 retrolisthesis. Disc uncovering with disc bulge.
Endplate spurring/osteophyte ridge, greatest along the left lateral
aspect of the disc space. Facet arthrosis which is advanced on the
left. Mild ligamentum flavum hypertrophy. No significant spinal
canal stenosis. Bilateral neural foraminal narrowing (moderate
right, severe left).

L4-L5: Prior posterior decompression. 4 mm grade 1 anterolisthesis.
Disc uncovering. Posterior element hypertrophy. No significant
spinal canal stenosis. Moderate bilateral neural foraminal narrowing
(greater on the left).

L5-S1: Prior posterior decompression. Disc bulge. Endplate
spurring/osteophyte ridge greatest along the right lateral aspect of
the disc space. Mild facet arthrosis. Mild residual hypertrophied
ligamentum flavum. Mild left subarticular narrowing without frank
nerve root impingement. Central canal patent. Bilateral neural
foraminal narrowing (moderate right, mild left).
IMPRESSION: Lumbar spondylosis and postsurgical changes to the lumbar spine as
described. Findings are most notably as follows.

At L2-L3, there is multifactorial moderate central canal stenosis
and left greater than right subarticular narrowing. Severe left
neural foraminal narrowing.

At L3-L4, there is multifactorial bilateral neural foraminal
narrowing (moderate right, severe left).

At L4-L5, sequela of prior posterior decompression. Multifactorial
moderate bilateral neural foraminal narrowing (greater on the left).

At L5-S1, sequela of prior posterior decompression. Multifactorial
bilateral neural foraminal narrowing (moderate right, mild left).
Mild left subarticular narrowing is also present at this level
without frank nerve root impingement.

Also of note, there is advanced disc degeneration at the T11-T12
through L3-L4 levels.

Fairly prominent lumbar dextrocurvature with multilevel
spondylolisthesis as detailed.

## 2022-05-17 ENCOUNTER — Telehealth: Payer: Self-pay | Admitting: *Deleted

## 2022-05-17 NOTE — Telephone Encounter (Signed)
Patient was contacted by Barth Kirks with [facility] to schedule sleep study ordered by Dr Duke Salvia.  Per Barth Kirks patient declined to schedule saying that she wants something closer to [place]. Ordering provider notified.

## 2022-05-17 NOTE — Telephone Encounter (Signed)
Spoke with patient and will discuss with Luther Parody at office visit next week She does not feel comfortable driving from [place] to [place] and does not think she needs sleep study at this time.

## 2022-05-18 NOTE — Telephone Encounter (Signed)
Hi team,   If Miss Bernardini wants her sleep study in [place] do I need to refer to Pulmonology in [place] or can Dr. Rebeca Allegra. Tresa Endo read the study there?  Best,  Alver Sorrow, NP

## 2022-05-25 ENCOUNTER — Encounter (HOSPITAL_BASED_OUTPATIENT_CLINIC_OR_DEPARTMENT_OTHER): Payer: Self-pay | Admitting: Family

## 2022-05-25 ENCOUNTER — Ambulatory Visit (HOSPITAL_BASED_OUTPATIENT_CLINIC_OR_DEPARTMENT_OTHER): Payer: Medicare PPO | Admitting: Family

## 2022-05-25 VITALS — BP 130/70 | HR 65 | Ht 59.0 in | Wt 167.0 lb

## 2022-05-25 DIAGNOSIS — E871 Hypo-osmolality and hyponatremia: Secondary | ICD-10-CM

## 2022-05-25 DIAGNOSIS — I700 Atherosclerosis of aorta: Secondary | ICD-10-CM

## 2022-05-25 DIAGNOSIS — I10 Essential (primary) hypertension: Secondary | ICD-10-CM

## 2022-05-25 DIAGNOSIS — E785 Hyperlipidemia, unspecified: Secondary | ICD-10-CM

## 2022-05-25 NOTE — Progress Notes (Signed)
Advanced Hypertension Clinic Assessment:    Date:  05/25/2022   ID:  Jill Stanley, DOB 06/08/1932, MRN [id number]  PCP:  [name], MD  Cardiologist:  Chilton Si, MD  Nephrologist:  Referring MD: [name], MD   CC: Hypertension  History of Present Illness:    Jill Stanley is a 89 y.o. female with a hx of hypertension, hyperlipidemia, prediabetes, aortic atherosclerosis here to follow up in the Advanced Hypertension Clinic.   Previously saw Dr. Kirke Corin for uncontrolled hypertension in 2020.  Renal Doppler 07/2018 unremarkable.  Echo 10/2021 LVEF 55 to 60%, grade 1 diastolic dysfunction.  Established with advanced hypertension clinic and Dr. Duke Salvia 02/27/2022.    Referred by PCP as tolerating regimen well but wanted to work on weaning clonidine due to age. Blood pressures were generally controlled at home though elevated in the office.  No changes were made and asked to check blood pressure twice per day at home.  Carvedilol, clonidine, lisinopril, spironolactone were continued. Renin-aldosterone was normal. She was recommended for sleep study and to start exercising at the [facility].  She subsequently opted not to complete sleep study at home and she was concerned regarding phone minutes and requested to complete in [place].  She presents today for follow up independently. 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 Noted her lingering dry cough at previous OV persisted so she saw Dr. Clent Ridges and she was positive for RSV which was treated with Prednisone with resolution of symptoms. BP at home over the last week 110/63-141/79. Average blood pressure most often 120s/60s. Tolerating current regimen without side effects.   Tells me she would prefer not to do a sleep study. She sleeps well during the night since she is now on "bladder pill" and does not have to get up as much during the night. Reports no daytime somnolence and wakes feeling well rested.   Requests to return to PCP or  cardiology at [facility] office as understandably prefers not to drive to [place].   Eating predominantly at home. She notes she does eat a decent amount of processed foods but has been working to reduce intake of salt. Exercise tolerance limited by arthritis. Discussed purchase of possible exercise pedals.    Previous antihypertensives: Amlodipine-edema HCTZ-hyponatremia Hydralazine  Past Medical History:  Diagnosis Date   Anal pain 03/28/2019   Arthritis    Chronic gastritis without bleeding 07/29/2020   Chronic low back pain    Complication of anesthesia    "All my muscles were sore all over my body was sore the next day.. due to mixture of gases"   COVID-19    03/2021   Endometrial cancer    Epigastric pain 07/29/2020   GERD (gastroesophageal reflux disease)    Heart murmur 2018   tiney murmur, no treatment needed   Hypertension    Hyponatremia    chronic    NONSPECIFIC ABN FINDING RAD & OTH EXAM GI TRACT 07/06/2009   Qualifier: Diagnosis of  By: Melvyn Neth CMA (AAMA), Patty     Osteopenia    Prediabetes    Right foot pain 03/28/2019   RLS (restless legs syndrome)    S/P total knee arthroplasty 08/09/2016   Ulcer of the stomach caused by bacteria (H. pylori)    history of   Ulcer of the stomach caused by bacteria (H. pylori)    history of    Past Surgical History:  Procedure Laterality Date   ABDOMINAL HYSTERECTOMY  1992   BACK SURGERY  1989  L3, 4 and 5   DILATION AND CURETTAGE OF UTERUS  1992   KNEE ARTHROPLASTY Left 08/09/2016   Procedure: COMPUTER ASSISTED TOTAL KNEE ARTHROPLASTY;  Surgeon: Donato Heinz, MD;  Location: [facility] ORS;  Service: Orthopedics;  Laterality: Left;   KNEE ARTHROSCOPY W/ MENISCAL REPAIR Left     Current Medications: Current Meds  Medication Sig   acetaminophen (TYLENOL) 650 MG CR tablet Take 1,300 mg by mouth every 8 (eight) hours as needed for pain.   Calcium Carbonate-Vitamin D3 600-400 MG-UNIT TABS Take 2 tablets by mouth daily.     calcium elemental as carbonate (BARIATRIC TUMS ULTRA) 400 MG chewable tablet Chew 1,000 mg by mouth as needed for heartburn.   carvedilol (COREG) 3.125 MG tablet Take 1 tablet (3.125 mg total) by mouth 2 (two) times daily with a meal.   cloNIDine (CATAPRES) 0.1 MG tablet Take 1 tablet (0.1 mg total) by mouth in the morning and at bedtime.   cyanocobalamin (VITAMIN B12) 250 MCG tablet Take 250 mcg by mouth daily.   esomeprazole (NEXIUM) 20 MG capsule TAKE 1 CAPSULE DAILY AT 12 NOON   ezetimibe (ZETIA) 10 MG tablet TAKE 1 TABLET EVERY DAY   lisinopril (ZESTRIL) 40 MG tablet Take 1 tablet (40 mg total) by mouth daily.   sodium chloride (OCEAN) 0.65 % SOLN nasal spray Place 2 sprays into both nostrils as needed for congestion.   solifenacin (VESICARE) 5 MG tablet Take 1 tablet (5 mg total) by mouth daily.   spironolactone (ALDACTONE) 25 MG tablet Take 1 tablet (25 mg total) by mouth daily. In am See change in sig 1x per day   VITAMIN D PO Take 2,000 Units by mouth daily.      Allergies:   Amlodipine, Hydralazine, Hydralazine hcl, Norvasc [amlodipine besylate], Procardia xl [nifedipine er], Epinephrine, Ibuprofen, Lovastatin, Morphine, Tramadol, and Zocor [simvastatin]   Social History   Socioeconomic History   Marital status: Widowed    Spouse name: Not on file   Number of children: Not on file   Years of education: Not on file   Highest education level: Not on file  Occupational History   Not on file  Tobacco Use   Smoking status: Never   Smokeless tobacco: Never  Vaping Use   Vaping Use: Never used  Substance and Sexual Activity   Alcohol use: Yes    Comment: a glass of wine 2-3 times a year   Drug use: No   Sexual activity: Not Currently  Other Topics Concern   Not on file  Social History Narrative   Married lives with husband Lexie Koehl who is DPR    Husband died 16-Jan-2020   Mar 19, 2019 married 65 years    Social Determinants of Health   Financial Resource Strain: Low  Risk  (04/19/2021)   Overall Financial Resource Strain (CARDIA)    Difficulty of Paying Living Expenses: Not hard at all  Food Insecurity: No Food Insecurity (04/19/2021)   Hunger Vital Sign    Worried About Running Out of Food in the Last Year: Never true    Ran Out of Food in the Last Year: Never true  Transportation Needs: No Transportation Needs (04/19/2021)   PRAPARE - Administrator, Civil Service (Medical): No    Lack of Transportation (Non-Medical): No  Physical Activity: Inactive (02/27/2022)   Exercise Vital Sign    Days of Exercise per Week: 0 days    Minutes of Exercise per Session: 0 min  Stress: No  Stress Concern Present (04/19/2021)   [employer] of Occupational Health - Occupational Stress Questionnaire    Feeling of Stress : Not at all  Social Connections: Unknown (04/19/2021)   Social Connection and Isolation Panel [NHANES]    Frequency of Communication with Friends and Family: More than three times a week    Frequency of Social Gatherings with Friends and Family: More than three times a week    Attends Religious Services: Not on file    Active Member of Clubs or Organizations: Yes    Attends Banker Meetings: 1 to 4 times per year    Marital Status: Widowed     Family History: The patient's family history includes Alzheimer's disease in her father; Aneurysm in her grandson; Breast cancer (age of onset: 40) in her daughter.  ROS:   Please see the history of present illness.     All other systems reviewed and are negative.  EKGs/Labs/Other Studies Reviewed:    EKG:  EKG is not ordered today.    Cardiac Studies & Procedures       ECHOCARDIOGRAM  ECHOCARDIOGRAM COMPLETE 11/28/2021  Narrative ECHOCARDIOGRAM REPORT    Patient Name:   Jill Stanley Date of Exam: 11/28/2021 Medical Rec #:  [id number]       Height:       59.0 in Accession #:    [id number]      Weight:       167.2 lb Date of Birth:  [birth year]       BSA:           1.709 m Patient Age:    89 years        BP:           132/84 mmHg Patient Gender: F               HR:           75 bpm. Exam Location:  [facility]  Procedure: 2D Echo, Cardiac Doppler and Color Doppler  Indications:     Dyspnea R06.00  History:         Patient has no prior history of Echocardiogram examinations.  Sonographer:     Cristela Blue Referring Phys:  [id number] Pasty Spillers MCLEAN-SCOCUZZA Diagnosing Phys: Lorine Bears MD   Sonographer Comments: Suboptimal apical window and no subcostal window. IMPRESSIONS   1. Left ventricular ejection fraction, by estimation, is 55 to 60%. The left ventricle has normal function. The left ventricle has no regional wall motion abnormalities. Left ventricular diastolic parameters are consistent with Grade I diastolic dysfunction (impaired relaxation). 2. Right ventricular systolic function is normal. The right ventricular size is normal. Tricuspid regurgitation signal is inadequate for assessing PA pressure. 3. Left atrial size was mildly dilated. 4. The mitral valve is normal in structure. Mild mitral valve regurgitation. No evidence of mitral stenosis. 5. The aortic valve is normal in structure. Aortic valve regurgitation is mild. Aortic valve sclerosis is present, with no evidence of aortic valve stenosis.  FINDINGS Left Ventricle: Left ventricular ejection fraction, by estimation, is 55 to 60%. The left ventricle has normal function. The left ventricle has no regional wall motion abnormalities. The left ventricular internal cavity size was normal in size. There is no left ventricular hypertrophy. Left ventricular diastolic parameters are consistent with Grade I diastolic dysfunction (impaired relaxation).  Right Ventricle: The right ventricular size is normal. No increase in right ventricular wall thickness. Right ventricular systolic function is normal. Tricuspid regurgitation signal is inadequate  for assessing PA pressure.  Left Atrium: Left atrial  size was mildly dilated.  Right Atrium: Right atrial size was normal in size.  Pericardium: There is no evidence of pericardial effusion.  Mitral Valve: The mitral valve is normal in structure. Mild mitral valve regurgitation. No evidence of mitral valve stenosis.  Tricuspid Valve: The tricuspid valve is normal in structure. Tricuspid valve regurgitation is not demonstrated. No evidence of tricuspid stenosis.  Aortic Valve: The aortic valve is normal in structure. Aortic valve regurgitation is mild. Aortic regurgitation PHT measures 519 msec. Aortic valve sclerosis is present, with no evidence of aortic valve stenosis. Aortic valve mean gradient measures 3.5 mmHg. Aortic valve peak gradient measures 6.4 mmHg. Aortic valve area, by VTI measures 2.14 cm.  Pulmonic Valve: The pulmonic valve was normal in structure. Pulmonic valve regurgitation is mild. No evidence of pulmonic stenosis.  Aorta: The aortic root is normal in size and structure.  Venous: The inferior vena cava was not well visualized.  IAS/Shunts: No atrial level shunt detected by color flow Doppler.   LEFT VENTRICLE PLAX 2D LVIDd:         3.80 cm   Diastology LVIDs:         2.70 cm   LV e' medial:    7.62 cm/s LV PW:         1.00 cm   LV E/e' medial:  9.8 LV IVS:        0.90 cm   LV e' lateral:   6.31 cm/s LVOT diam:     2.00 cm   LV E/e' lateral: 11.9 LV SV:         56 LV SV Index:   33 LVOT Area:     3.14 cm   RIGHT VENTRICLE RV Basal diam:  3.30 cm RV Mid diam:    3.30 cm RV S prime:     10.00 cm/s  LEFT ATRIUM           Index        RIGHT ATRIUM           Index LA diam:      4.20 cm 2.46 cm/m   RA Area:     12.00 cm LA Vol (A2C): 38.1 ml 22.29 ml/m  RA Volume:   26.90 ml  15.74 ml/m LA Vol (A4C): 23.7 ml 13.87 ml/m AORTIC VALVE AV Area (Vmax):    2.08 cm AV Area (Vmean):   2.05 cm AV Area (VTI):     2.14 cm AV Vmax:           126.50 cm/s AV Vmean:          85.500 cm/s AV VTI:            0.263  m AV Peak Grad:      6.4 mmHg AV Mean Grad:      3.5 mmHg LVOT Vmax:         83.80 cm/s LVOT Vmean:        55.700 cm/s LVOT VTI:          0.179 m LVOT/AV VTI ratio: 0.68 AI PHT:            519 msec  AORTA Ao Root diam: 3.05 cm  MITRAL VALVE                TRICUSPID VALVE MV Area (PHT): 2.04 cm     TR Peak grad:   36.5 mmHg MV Decel Time: 372 msec  TR Vmax:        302.00 cm/s MV E velocity: 74.90 cm/s MV A velocity: 112.00 cm/s  SHUNTS MV E/A ratio:  0.67         Systemic VTI:  0.18 m Systemic Diam: 2.00 cm  Muhammad Arida MD Electronically signed by Lorine Bears MD Signature Date/Time: 11/28/2021/1:36:15 PM    Final              Recent Labs: 08/04/2021: ALT 14; BUN 23; Creatinine, Ser 0.81; Hemoglobin 13.8; Platelets 232.0; Potassium 4.9; Sodium 131; TSH 1.35   Recent Lipid Panel    Component Value Date/Time   CHOL 193 08/04/2021 0736   CHOL 253 (H) 08/09/2018 0854   TRIG 182.0 (H) 08/04/2021 0736   HDL 58.00 08/04/2021 0736   HDL 80 08/09/2018 0854   CHOLHDL 3 08/04/2021 0736   VLDL 36.4 08/04/2021 0736   LDLCALC 98 08/04/2021 0736   LDLCALC 154 (H) 08/09/2018 0854   LDLDIRECT 100.0 08/03/2020 0733    Physical Exam:   VS:  BP (!) 171/72   Pulse 65   Ht  (1.499 m)   Wt 167 lb (75.8 kg)   BMI 33.73 kg/m  , BMI Body mass index is 33.73 kg/m. GENERAL:  Well appearing HEENT: Pupils equal round and reactive, fundi not visualized, oral mucosa unremarkable NECK:  No jugular venous distention, waveform within normal limits, carotid upstroke brisk and symmetric, no bruits, no thyromegaly LYMPHATICS:  No cervical adenopathy LUNGS:  Clear to auscultation bilaterally HEART:  RRR.  PMI not displaced or sustained,S1 and S2 within normal limits, no S3, no S4, no clicks, no rubs, no murmurs ABD:  Flat, positive bowel sounds normal in frequency in pitch, no bruits, no rebound, no guarding, no midline pulsatile mass, no hepatomegaly, no splenomegaly EXT:  2 plus  pulses throughout, no edema, no cyanosis no clubbing SKIN:  No rashes no nodules NEURO:  Cranial nerves II through XII grossly intact, motor grossly intact throughout PSYCH:  Cognitively intact, oriented to person place and time   ASSESSMENT/PLAN:    HTN- Known whitecoat hypertension.  Home BP cuff found to be accurate today.  Average BP at home 120s/60s. BP well controlled. Continue current antihypertensive regimen clonidine 0.1 mg BID, carvedilol 3.125 mg BID, lisinopril 40 mg QD, spironolactone 25 mg QD.  Careful monitoring clonidine given age.  We discussed possible transition to half tab BID but as BP controlled and she is feeling well she wished to maintain present regimen. If unable to tolerate Clonidine in the future, could consider transition Lisinopril  to Valsartan as more potent agent. However, as tolerating present regimen without side effects will continue same. Per her request for closer OV, will return in 6 mos with Dr. Kirke Corin or APP.  Snores - Was previously told she snores. Wakes feeling rested and no daytime somnolence. Sleep improved since addition of Vesicare and decreased urinary frequency. Wishes to defer sleep study as symptoms improved and returned Itamar device. If sleep symptoms recur, recommend outpatient sleep study with pulmonology in [place] as closer to her home.  Aortic atherosclerosis/LDL goal less than 70-statin intolerance.  Continue Zetia. Discussed possible addition of Bempedoic acid to obtain LDL <70. Discussed lifestyle changes to lower cholesterol. She wishes to think about bempedoic acid and discuss with PCP. Nexletol or Nexlizet would be $47/30 day supply. Does have benefit of reducing cardiovascular benefit but given age also reasonable to proceed with lifestyle changes alone with Zetia.  Hyponatremia-chronic hyponatremia previously exLorine Bearscerbated  by HCTZ.  Would not further titrate spironolactone.  She is asymptomatic in regards to hyponatremia.  Monitor  with periodic BMP.  Screening for Secondary Hypertension:     02/27/2022   10:58 AM  Causes  Drugs/Herbals Screened     - Comments Some TV dinners at home.  Mostly eats at home.  One coffee daily.  Rare EtOH. No tobacco, no NSAIDS.  Renovascular HTN Screened     - Comments Normal renal Dopplers in 2020  Sleep Apnea Screened     - Comments check sleep study  Thyroid Disease Screened  Hyperaldosteronism Screened     - Comments Check renal and and aldosterone  Pheochromocytoma N/A  Cushing's Syndrome N/A  Hyperparathyroidism Screened  Coarctation of the Aorta Screened     - Comments BP symmetric  Compliance Screened    Relevant Labs/Studies:    Latest Ref Rng & Units 08/04/2021    7:36 AM 03/03/2021    8:42 AM 12/08/2020    4:36 PM  Basic Labs  Sodium 135 - 145 mEq/L 131  134  134   Potassium 3.5 - 5.1 mEq/L 4.9  4.5  4.2   Creatinine 0.40 - 1.20 mg/dL 1.61  0.96  0.45        Latest Ref Rng & Units 08/04/2021    7:36 AM 08/03/2020    7:33 AM  Thyroid   TSH 0.35 - 5.50 uIU/mL 1.35  1.75        Latest Ref Rng & Units 02/27/2022   11:47 AM  Renin/Aldosterone   Aldosterone 0.0 - 30.0 ng/dL 8.9   Aldos/Renin Ratio 0.0 - 30.0 6.0               Disposition:    FU with Dr. Kirke Corin or APP in 6 months in general cardiology   Medication Adjustments/Labs and Tests Ordered: Current medicines are reviewed at length with the patient today.  Concerns regarding medicines are outlined above.  No orders of the defined types were placed in this encounter.  No orders of the defined types were placed in this encounter.    Signed, Alver Sorrow, NP  05/25/2022 11:15 AM    [facility]

## 2022-05-25 NOTE — Patient Instructions (Addendum)
Medication Instructions:  Your physician recommends that you continue on your current medications as directed. Please refer to the Current Medication list given to you today.  Follow-Up: 6 month follow up with Dr. Kirke Corin or [facility] APP    Special Instructions:  Could try purchasing 'exercise pedals' on [employer] to increase exercise  If your blood pressure is not well controlled int he future we could consider changing your Lisinopril to an ARB such as Olmesartan or Valsartan which are a bit more potent.   If we wanted to improve your cholesterol we could consider adding Bemepedoic acid to your regimen. This can come in a combination tablet with Ezetemibe (Zetia) if we wish.  Bempedoic Acid Tablets What is this medication? BEMPEDOIC ACID (BEM pe DOE ik AS id) treats high cholesterol. It works by reducing the amount of cholesterol made by your body. Changes to diet and exercise are often combined with this medication. This medicine may be used for other purposes; ask your health care provider or pharmacist if you have questions. COMMON BRAND NAME(S): NEXLETOL What should I tell my care team before I take this medication? They need to know if you have any of these conditions: Gout Kidney problems Liver disease Tendon problems An unusual or allergic reaction to bempedoic acid, other medications, foods, dyes, or preservatives Pregnant or trying to become pregnant Breast-feeding How should I use this medication? Take this medication by mouth. Take it as directed on the prescription label at the same time every day. You can take it with or without food. If it upsets your stomach, take it with food. Keep taking it unless your care team tells you to stop. Talk to your care team about the use of this medication in children. Special care may be needed. Overdosage: If you think you have taken too much of this medicine contact a poison control center or emergency room at once. NOTE: This medicine  is only for you. Do not share this medicine with others. What if I miss a dose? If you miss a dose, take it as soon as you can. If it is almost time for your next dose, take only that dose. Do not take double or extra doses. What may interact with this medication? Pravastatin Simvastatin This list may not describe all possible interactions. Give your health care provider a list of all the medicines, herbs, non-prescription drugs, or dietary supplements you use. Also tell them if you smoke, drink alcohol, or use illegal drugs. Some items may interact with your medicine. What should I watch for while using this medication? Visit your care team for regular checks on your progress. Tell your care team if your symptoms do not start to get better or if they get worse. Your care team may tell you to stop taking this medication if you develop muscle problems. If your muscle problems do not go away after stopping this medication, contact your care team. Taking this medication is only part of a total heart healthy program. Ask your care team if there are other changes you can make to improve your overall health. What side effects may I notice from receiving this medication? Side effects that you should report to your care team as soon as possible: Allergic reactions--skin rash, itching, hives, swelling of the face, lips, tongue, or throat High uric acid level--severe pain, redness, warmth, or swelling in joints, pain or trouble passing urine, pain in the lower back or sides Joint, muscle, or tendon pain, swelling, or stiffness Side  effects that usually do not require medical attention (report to your care team if they continue or are bothersome): Diarrhea Muscle spasms Stomach pain This list may not describe all possible side effects. Call your doctor for medical advice about side effects. You may report side effects to FDA at 1-800-FDA-1088. Where should I keep my medication? Keep out of the reach of  children and pets. Store between 15 and 30 degrees C (59 and 86 degrees F). Keep this medication in the original container. Protect from moisture. Keep the container tightly closed. Do not throw out the packet in the container. It keeps the medication dry. Get rid of any unused medication after the expiration date. To get rid of medications that are no longer needed or have expired: Take the medication to a medication take-back program. Check with your pharmacy or law enforcement to find a location. If you cannot return the medication, check the label or package insert to see if the medication should be thrown out in the trash or flushed down the toilet. If you are not sure, ask your care team. If it is safe to put it in the trash, empty the medication out of the container. Mix the medication with cat litter, dirt, coffee grounds, or other unwanted substance. Seal the mixture in a bag or container. Put it in the trash. NOTE: This sheet is a summary. It may not cover all possible information. If you have questions about this medicine, talk to your doctor, pharmacist, or health care provider.  2023 Elsevier/Gold Standard (2021-01-17 00:00:00)

## 2022-05-30 ENCOUNTER — Other Ambulatory Visit: Payer: Self-pay

## 2022-05-30 MED ORDER — CLONIDINE HCL 0.1 MG PO TABS: 0.1000 mg | ORAL_TABLET | Freq: Two times a day (BID) | ORAL | 3 refills | Status: DC

## 2022-06-06 ENCOUNTER — Other Ambulatory Visit: Payer: Self-pay | Admitting: Family Medicine

## 2022-06-06 DIAGNOSIS — Z1231 Encounter for screening mammogram for malignant neoplasm of breast: Secondary | ICD-10-CM

## 2022-06-27 ENCOUNTER — Ambulatory Visit
Admission: RE | Admit: 2022-06-27 | Discharge: 2022-06-27 | Disposition: A | Discharge status: Home / Self Care | Payer: Medicare PPO | Source: Ambulatory Visit | Attending: Family Medicine | Admitting: Family Medicine

## 2022-06-27 DIAGNOSIS — Z1231 Encounter for screening mammogram for malignant neoplasm of breast: Secondary | ICD-10-CM | POA: Diagnosis not present

## 2022-06-28 ENCOUNTER — Encounter: Payer: Self-pay | Admitting: Family Medicine

## 2022-07-04 ENCOUNTER — Telehealth: Payer: Self-pay | Admitting: Family Medicine

## 2022-07-10 ENCOUNTER — Other Ambulatory Visit: Payer: Self-pay | Admitting: Family Medicine

## 2022-07-10 ENCOUNTER — Ambulatory Visit (INDEPENDENT_AMBULATORY_CARE_PROVIDER_SITE_OTHER): Payer: Medicare PPO

## 2022-07-10 VITALS — Ht 59.0 in | Wt 167.0 lb

## 2022-07-10 DIAGNOSIS — Z0000 Encounter for general adult medical examination without abnormal findings: Secondary | ICD-10-CM

## 2022-07-10 DIAGNOSIS — I10 Essential (primary) hypertension: Secondary | ICD-10-CM

## 2022-07-10 DIAGNOSIS — E785 Hyperlipidemia, unspecified: Secondary | ICD-10-CM

## 2022-07-10 MED ORDER — CARVEDILOL 3.125 MG PO TABS: 3.1250 mg | ORAL_TABLET | Freq: Two times a day (BID) | ORAL | 3 refills | Status: DC

## 2022-07-10 MED ORDER — EZETIMIBE 10 MG PO TABS: 10.0000 mg | ORAL_TABLET | Freq: Every day | ORAL | 3 refills | Status: DC

## 2022-07-10 NOTE — Telephone Encounter (Signed)
   Prescription Request  07/10/2022  LOV: 03/07/2022  What is the name of the medication or equipment? Solifenacin, carvedilol and ezetimibe  Have you contacted your pharmacy to request a refill? Yes   Which pharmacy would you like this sent to?  [facility]  Patient notified that their request is being sent to the clinical staff for review and that they should receive a response within 2 business days.   Please advise at [facility]

## 2022-07-10 NOTE — Patient Instructions (Signed)
Jill Stanley , Thank you for taking time to come for your Medicare Wellness Visit. I appreciate your ongoing commitment to your health goals. Please review the following plan we discussed and let me know if I can assist you in the future.   These are the goals we discussed:  Goals      DIET - EAT MORE FRUITS AND VEGETABLES     Weight (lb) < 160 lb (72.6 kg)     I want to lose 4lb with portion control, and reducing sugar intake        This is a list of the screening recommended for you and due dates:  Health Maintenance  Topic Date Due   COVID-19 Vaccine (6 - 2023-24 season) 09/30/2021   Flu Shot  08/31/2022   Medicare Annual Wellness Visit  07/10/2023   DTaP/Tdap/Td vaccine (3 - Td or Tdap) 04/21/2031   Pneumonia Vaccine  Completed   DEXA scan (bone density measurement)  Completed   Zoster (Shingles) Vaccine  Completed   HPV Vaccine  Aged Out    Advanced directives: no  Conditions/risks identified: none  Next appointment: Follow up in one year for your annual wellness visit 07/11/23 @ 8:45 am by phone   Preventive Care 65 Years and Older, Female Preventive care refers to lifestyle choices and visits with your health care provider that can promote health and wellness. What does preventive care include? A yearly physical exam. This is also called an annual well check. Dental exams once or twice a year. Routine eye exams. Ask your health care provider how often you should have your eyes checked. Personal lifestyle choices, including: Daily care of your teeth and gums. Regular physical activity. Eating a healthy diet. Avoiding tobacco and drug use. Limiting alcohol use. Practicing safe sex. Taking low-dose aspirin every day. Taking vitamin and mineral supplements as recommended by your health care provider. What happens during an annual well check? The services and screenings done by your health care provider during your annual well check will depend on your age, overall  health, lifestyle risk factors, and family history of disease. Counseling  Your health care provider may ask you questions about your: Alcohol use. Tobacco use. Drug use. Emotional well-being. Home and relationship well-being. Sexual activity. Eating habits. History of falls. Memory and ability to understand (cognition). Work and work Astronomer. Reproductive health. Screening  You may have the following tests or measurements: Height, weight, and BMI. Blood pressure. Lipid and cholesterol levels. These may be checked every 5 years, or more frequently if you are over 43 years old. Skin check. Lung cancer screening. You may have this screening every year starting at age 72 if you have a 30-pack-year history of smoking and currently smoke or have quit within the past 15 years. Fecal occult blood test (FOBT) of the stool. You may have this test every year starting at age 14. Flexible sigmoidoscopy or colonoscopy. You may have a sigmoidoscopy every 5 years or a colonoscopy every 10 years starting at age 87. Hepatitis C blood test. Hepatitis B blood test. Sexually transmitted disease (STD) testing. Diabetes screening. This is done by checking your blood sugar (glucose) after you have not eaten for a while (fasting). You may have this done every 1-3 years. Bone density scan. This is done to screen for osteoporosis. You may have this done starting at age 55. Mammogram. This may be done every 1-2 years. Talk to your health care provider about how often you should have regular mammograms.  Talk with your health care provider about your test results, treatment options, and if necessary, the need for more tests. Vaccines  Your health care provider may recommend certain vaccines, such as: Influenza vaccine. This is recommended every year. Tetanus, diphtheria, and acellular pertussis (Tdap, Td) vaccine. You may need a Td booster every 10 years. Zoster vaccine. You may need this after age  87. Pneumococcal 13-valent conjugate (PCV13) vaccine. One dose is recommended after age 41. Pneumococcal polysaccharide (PPSV23) vaccine. One dose is recommended after age 65. Talk to your health care provider about which screenings and vaccines you need and how often you need them. This information is not intended to replace advice given to you by your health care provider. Make sure you discuss any questions you have with your health care provider. Document Released: 02/12/2015 Document Revised: 10/06/2015 Document Reviewed: 11/17/2014 Elsevier Interactive Patient Education  2017 [place] Prevention in the Home Falls can cause injuries. They can happen to people of all ages. There are many things you can do to make your home safe and to help prevent falls. What can I do on the outside of my home? Regularly fix the edges of walkways and driveways and fix any cracks. Remove anything that might make you trip as you walk through a door, such as a raised step or threshold. Trim any bushes or trees on the path to your home. Use bright outdoor lighting. Clear any walking paths of anything that might make someone trip, such as rocks or tools. Regularly check to see if handrails are loose or broken. Make sure that both sides of any steps have handrails. Any raised decks and porches should have guardrails on the edges. Have any leaves, snow, or ice cleared regularly. Use sand or salt on walking paths during winter. Clean up any spills in your garage right away. This includes oil or grease spills. What can I do in the bathroom? Use night lights. Install grab bars by the toilet and in the tub and shower. Do not use towel bars as grab bars. Use non-skid mats or decals in the tub or shower. If you need to sit down in the shower, use a plastic, non-slip stool. Keep the floor dry. Clean up any water that spills on the floor as soon as it happens. Remove soap buildup in the tub or shower  regularly. Attach bath mats securely with double-sided non-slip rug tape. Do not have throw rugs and other things on the floor that can make you trip. What can I do in the bedroom? Use night lights. Make sure that you have a light by your bed that is easy to reach. Do not use any sheets or blankets that are too big for your bed. They should not hang down onto the floor. Have a firm chair that has side arms. You can use this for support while you get dressed. Do not have throw rugs and other things on the floor that can make you trip. What can I do in the kitchen? Clean up any spills right away. Avoid walking on wet floors. Keep items that you use a lot in easy-to-reach places. If you need to reach something above you, use a strong step stool that has a grab bar. Keep electrical cords out of the way. Do not use floor polish or wax that makes floors slippery. If you must use wax, use non-skid floor wax. Do not have throw rugs and other things on the floor that can make  you trip. What can I do with my stairs? Do not leave any items on the stairs. Make sure that there are handrails on both sides of the stairs and use them. Fix handrails that are broken or loose. Make sure that handrails are as long as the stairways. Check any carpeting to make sure that it is firmly attached to the stairs. Fix any carpet that is loose or worn. Avoid having throw rugs at the top or bottom of the stairs. If you do have throw rugs, attach them to the floor with carpet tape. Make sure that you have a light switch at the top of the stairs and the bottom of the stairs. If you do not have them, ask someone to add them for you. What else can I do to help prevent falls? Wear shoes that: Do not have high heels. Have rubber bottoms. Are comfortable and fit you well. Are closed at the toe. Do not wear sandals. If you use a stepladder: Make sure that it is fully opened. Do not climb a closed stepladder. Make sure that  both sides of the stepladder are locked into place. Ask someone to hold it for you, if possible. Clearly mark and make sure that you can see: Any grab bars or handrails. First and last steps. Where the edge of each step is. Use tools that help you move around (mobility aids) if they are needed. These include: Canes. Walkers. Scooters. Crutches. Turn on the lights when you go into a dark area. Replace any light bulbs as soon as they burn out. Set up your furniture so you have a clear path. Avoid moving your furniture around. If any of your floors are uneven, fix them. If there are any pets around you, be aware of where they are. Review your medicines with your doctor. Some medicines can make you feel dizzy. This can increase your chance of falling. Ask your doctor what other things that you can do to help prevent falls. This information is not intended to replace advice given to you by your health care provider. Make sure you discuss any questions you have with your health care provider. Document Released: 11/12/2008 Document Revised: 06/24/2015 Document Reviewed: 02/20/2014 Elsevier Interactive Patient Education  2017 [employer].

## 2022-07-10 NOTE — Progress Notes (Signed)
I connected with  Jill Stanley on 07/10/22 by a audio enabled telemedicine application and verified that I am speaking with the correct person using two identifiers.  Patient Location: Home  Provider Location: Office/Clinic  I discussed the limitations of evaluation and management by telemedicine. The patient expressed understanding and agreed to proceed.  Subjective:   Jill Stanley is a 89 y.o. female who presents for Medicare Annual (Subsequent) preventive examination.  Review of Systems     Cardiac Risk Factors include: advanced age (>74men, >61 women);hypertension;dyslipidemia     Objective:    Today's Vitals   07/10/22 1029 07/10/22 1049  Weight:  167 lb (75.8 kg)  Height:  4\' 11"  (1.499 m)  PainSc: 4     Body mass index is 33.73 kg/m.     07/10/2022   10:38 AM 04/19/2021   10:49 AM 03/22/2020    9:47 AM 03/20/2019   10:08 AM 05/06/2018    7:51 PM 08/09/2016    1:00 PM 08/09/2016    6:06 AM  Advanced Directives  Does Patient Have a Medical Advance Directive? No Yes Yes Yes No No Yes  Type of Special educational needs teacher of [place];Living will Healthcare Power of [place];Living will Healthcare Power of [place];Living will  Healthcare Power of [place];Living will Healthcare Power of [place];Living will  Does patient want to make changes to medical advance directive?  No - Patient declined No - Patient declined No - Patient declined  No - Patient declined   Copy of Healthcare Power of Attorney in Chart?  No - copy requested Yes - validated most recent copy scanned in chart (See row information) Yes - validated most recent copy scanned in chart (See row information)  Yes Yes  Would patient like information on creating a medical advance directive? No - Patient declined    No - Patient declined No - Patient declined     Current Medications (verified) Outpatient Encounter Medications as of 07/10/2022  Medication Sig   acetaminophen (TYLENOL) 650 MG CR tablet  Take 1,300 mg by mouth every 8 (eight) hours as needed for pain.   Calcium Carbonate-Vitamin D3 600-400 MG-UNIT TABS Take 2 tablets by mouth daily.    calcium elemental as carbonate (BARIATRIC TUMS ULTRA) 400 MG chewable tablet Chew 1,000 mg by mouth as needed for heartburn.   carvedilol (COREG) 3.125 MG tablet Take 1 tablet (3.125 mg total) by mouth 2 (two) times daily with a meal.   cloNIDine (CATAPRES) 0.1 MG tablet Take 1 tablet (0.1 mg total) by mouth in the morning and at bedtime.   cyanocobalamin (VITAMIN B12) 250 MCG tablet Take 250 mcg by mouth daily.   esomeprazole (NEXIUM) 20 MG capsule TAKE 1 CAPSULE DAILY AT 12 NOON   ezetimibe (ZETIA) 10 MG tablet TAKE 1 TABLET EVERY DAY   lisinopril (ZESTRIL) 40 MG tablet Take 1 tablet (40 mg total) by mouth daily.   sodium chloride (OCEAN) 0.65 % SOLN nasal spray Place 2 sprays into both nostrils as needed for congestion.   solifenacin (VESICARE) 5 MG tablet TAKE 1 TABLET EVERY DAY   spironolactone (ALDACTONE) 25 MG tablet Take 1 tablet (25 mg total) by mouth daily. In am See change in sig 1x per day   VITAMIN D PO Take 2,000 Units by mouth daily.    No facility-administered encounter medications on file as of 07/10/2022.    Allergies (verified) Amlodipine, Hydralazine, Hydralazine hcl, Norvasc [amlodipine besylate], Procardia xl [nifedipine er], Epinephrine, Ibuprofen, Lovastatin, Morphine, Tramadol,  and Zocor [simvastatin]   History: Past Medical History:  Diagnosis Date   Anal pain 03/28/2019   Arthritis    Chronic gastritis without bleeding 07/29/2020   Chronic low back pain    Complication of anesthesia    "All my muscles were sore all over my body was sore the next day.. due to mixture of gases"   COVID-19    03/2021   Endometrial cancer (HCC)    Epigastric pain 07/29/2020   GERD (gastroesophageal reflux disease)    Heart murmur 2018   tiney murmur, no treatment needed   Hypertension    Hyponatremia    chronic    NONSPECIFIC  ABN FINDING RAD & OTH EXAM GI TRACT 07/06/2009   Qualifier: Diagnosis of  By: Melvyn Neth CMA (AAMA), Patty     Osteopenia    Prediabetes    Right foot pain 03/28/2019   RLS (restless legs syndrome)    S/P total knee arthroplasty 08/09/2016   Ulcer of the stomach caused by bacteria (H. pylori)    history of   Ulcer of the stomach caused by bacteria (H. pylori)    history of   Past Surgical History:  Procedure Laterality Date   ABDOMINAL HYSTERECTOMY  1992   BACK SURGERY  1989   L3, 4 and 5   DILATION AND CURETTAGE OF UTERUS  1992   KNEE ARTHROPLASTY Left 08/09/2016   Procedure: COMPUTER ASSISTED TOTAL KNEE ARTHROPLASTY;  Surgeon: Donato Heinz, MD;  Location: [facility] ORS;  Service: Orthopedics;  Laterality: Left;   KNEE ARTHROSCOPY W/ MENISCAL REPAIR Left    Family History  Problem Relation Age of Onset   Breast cancer Daughter 4   Aneurysm Grandson        brain age 63 y.o died 2019/09/11   Alzheimer's disease Father    Social History   Socioeconomic History   Marital status: Widowed    Spouse name: Not on file   Number of children: Not on file   Years of education: Not on file   Highest education level: Not on file  Occupational History   Not on file  Tobacco Use   Smoking status: Never   Smokeless tobacco: Never  Vaping Use   Vaping Use: Never used  Substance and Sexual Activity   Alcohol use: Yes    Comment: a glass of wine 2-3 times a year   Drug use: No   Sexual activity: Not Currently  Other Topics Concern   Not on file  Social History Narrative   Married lives with husband Ourania Bel who is DPR    Husband died 02-11-20   04-14-2019 married 65 years    Social Determinants of Health   Financial Resource Strain: Low Risk  (07/10/2022)   Overall Financial Resource Strain (CARDIA)    Difficulty of Paying Living Expenses: Not hard at all  Food Insecurity: No Food Insecurity (07/10/2022)   Hunger Vital Sign    Worried About Running Out of Food in the Last Year: Never  true    Ran Out of Food in the Last Year: Never true  Transportation Needs: No Transportation Needs (07/10/2022)   PRAPARE - Administrator, Civil Service (Medical): No    Lack of Transportation (Non-Medical): No  Physical Activity: Sufficiently Active (07/10/2022)   Exercise Vital Sign    Days of Exercise per Week: 5 days    Minutes of Exercise per Session: 60 min  Recent Concern: Physical Activity - Inactive (07/10/2022)  Exercise Vital Sign    Days of Exercise per Week: 0 days    Minutes of Exercise per Session: 0 min  Stress: No Stress Concern Present (07/10/2022)   [employer] of Occupational Health - Occupational Stress Questionnaire    Feeling of Stress : Not at all  Social Connections: Socially Isolated (07/10/2022)   Social Connection and Isolation Panel [NHANES]    Frequency of Communication with Friends and Family: Once a week    Frequency of Social Gatherings with Friends and Family: Once a week    Attends Religious Services: Never    Database administrator or Organizations: Yes    Attends Engineer, structural: More than 4 times per year    Marital Status: Widowed    Tobacco Counseling Counseling given: Not Answered   Clinical Intake:  Pre-visit preparation completed: Yes  Pain : 0-10 Pain Score: 4  Pain Location: Back     Diabetes: No  How often do you need to have someone help you when you read instructions, pamphlets, or other written materials from your doctor or pharmacy?: 1 - Never  Diabetic?no  Interpreter Needed?: No  Information entered by :: Kennedy Bucker, LPN   Activities of Daily Living    07/10/2022   10:39 AM  In your present state of health, do you have any difficulty performing the following activities:  Hearing? 0  Vision? 0  Difficulty concentrating or making decisions? 0  Walking or climbing stairs? 0  Dressing or bathing? 0  Doing errands, shopping? 0  Preparing Food and eating ? N  Using the Toilet?  N  In the past six months, have you accidently leaked urine? N  Do you have problems with loss of bowel control? N  Managing your Medications? N  Managing your Finances? N  Housekeeping or managing your Housekeeping? N    Patient Care Team: [name], MD as PCP - General (Family Medicine) Chilton Si, MD as PCP - Cardiology (Cardiology)  Indicate any recent Medical Services you may have received from other than [facility] providers in the past year (date may be approximate).     Assessment:   This is a routine wellness examination for Jill Stanley.  Hearing/Vision screen Hearing Screening - Comments:: Wears aids Vision Screening - Comments:: Readers- Dr.Porfilio   Dietary issues and exercise activities discussed: Current Exercise Habits: Home exercise routine, Type of exercise: walking, Time (Minutes): 60, Frequency (Times/Week): 3, Weekly Exercise (Minutes/Week): 180, Intensity: Mild   Goals Addressed             This Visit's Progress    DIET - EAT MORE FRUITS AND VEGETABLES         Depression Screen    07/10/2022   10:33 AM 03/07/2022   10:28 AM 01/17/2022    9:49 AM 11/15/2021    2:07 PM 07/29/2021    1:42 PM 04/19/2021   10:45 AM 07/29/2020    1:16 PM  PHQ 2/9 Scores  PHQ - 2 Score 0 0 0 0 0 0 0  PHQ- 9 Score 0 0 0        Fall Risk    07/10/2022   10:38 AM 03/07/2022   10:28 AM 01/17/2022    9:49 AM 11/15/2021    2:07 PM 07/29/2021    1:42 PM  Fall Risk   Falls in the past year? 1 0 0 0 0  Number falls in past yr: 1 0 0 0 0  Injury with Fall? 0 0  0 0 0  Risk for fall due to : History of fall(s) No Fall Risks No Fall Risks No Fall Risks No Fall Risks  Follow up Falls prevention discussed;Falls evaluation completed Falls evaluation completed Falls evaluation completed Falls evaluation completed Falls evaluation completed    FALL RISK PREVENTION PERTAINING TO THE HOME:  Any stairs in or around the home? No  If so, are there any without handrails? No  Home  free of loose throw rugs in walkways, pet beds, electrical cords, etc? Yes  Adequate lighting in your home to reduce risk of falls? Yes   ASSISTIVE DEVICES UTILIZED TO PREVENT FALLS:  Life alert? No  Use of a cane, walker or w/c? No  Grab bars in the bathroom? Yes  Shower chair or bench in shower? No  Elevated toilet seat or a handicapped toilet? No    Cognitive Function:        07/10/2022   10:44 AM 03/22/2020    9:50 AM 03/20/2019   10:24 AM  6CIT Screen  What Year? 0 points 0 points 0 points  What month? 0 points 0 points 0 points  What time? 0 points 0 points 0 points  Count back from 20 2 points  0 points  Months in reverse 0 points 0 points 0 points  Repeat phrase 0 points  0 points  Total Score 2 points  0 points    Immunizations Immunization History  Administered Date(s) Administered   Fluad Quad(high Dose 65+) 11/15/2021   Influenza Inj Mdck Quad Pf 10/29/2017   Influenza, High Dose Seasonal PF 11/29/2016, 10/17/2018   Influenza,inj,Quad PF,6+ Mos 10/29/2017   Influenza-Unspecified 11/12/2019, 10/30/2020   Moderna Covid-19 Vaccine Bivalent Booster 4yrs & up 12/17/2020   Moderna Sars-Covid-2 Vaccination 03/26/2019, 04/23/2019, 01/12/2020, 07/20/2020   Pneumococcal Conjugate-13 09/12/2018   Pneumococcal Polysaccharide-23 03/18/2020   Tdap 10/18/2010, 04/20/2021   Zoster Recombinat (Shingrix) 10/30/2020, 02/02/2021   Zoster, Live 05/01/2012    TDAP status: Up to date  Flu Vaccine status: Up to date  Pneumococcal vaccine status: Up to date  Covid-19 vaccine status: Completed vaccines  Qualifies for Shingles Vaccine? Yes   Zostavax completed No   Shingrix Completed?: Yes  Screening Tests Health Maintenance  Topic Date Due   COVID-19 Vaccine (6 - 2023-24 season) 09/30/2021   INFLUENZA VACCINE  08/31/2022   Medicare Annual Wellness (AWV)  07/10/2023   DTaP/Tdap/Td (3 - Td or Tdap) 04/21/2031   Pneumonia Vaccine 27+ Years old  Completed   DEXA SCAN   Completed   Zoster Vaccines- Shingrix  Completed   HPV VACCINES  Aged Out    Health Maintenance  Health Maintenance Due  Topic Date Due   COVID-19 Vaccine (6 - 2023-24 season) 09/30/2021    Colorectal cancer screening: No longer required.   Mammogram status: No longer required due to age.   Lung Cancer Screening: (Low Dose CT Chest recommended if Age 52-80 years, 30 pack-year currently smoking OR have quit w/in 15years.) does not qualify.   Additional Screening:  Hepatitis C Screening: does not qualify; Completed no  Vision Screening: Recommended annual ophthalmology exams for early detection of glaucoma and other disorders of the eye. Is the patient up to date with their annual eye exam?  Yes  Who is the provider or what is the name of the office in which the patient attends annual eye exams? Dr.Porfilio If pt is not established with a provider, would they like to be referred to a provider to establish care?  No .   Dental Screening: Recommended annual dental exams for proper oral hygiene  Community Resource Referral / Chronic Care Management: CRR required this visit?  No   CCM required this visit?  No      Plan:     I have personally reviewed and noted the following in the patient's chart:   Medical and social history Use of alcohol, tobacco or illicit drugs  Current medications and supplements including opioid prescriptions. Patient is not currently taking opioid prescriptions. Functional ability and status Nutritional status Physical activity Advanced directives List of other physicians Hospitalizations, surgeries, and ER visits in previous 12 months Vitals Screenings to include cognitive, depression, and falls Referrals and appointments  In addition, I have reviewed and discussed with patient certain preventive protocols, quality metrics, and best practice recommendations. A written personalized care plan for preventive services as well as general preventive  health recommendations were provided to patient.     Hal Hope, LPN   05/08/8117   Nurse Notes: none

## 2022-07-21 NOTE — Telephone Encounter (Signed)
July 21, 2022 Chilton Si, MD  to [facility]     07/21/22 10:39 AM OK thank you.  TCR

## 2022-08-14 ENCOUNTER — Other Ambulatory Visit: Payer: Self-pay

## 2022-08-14 DIAGNOSIS — I10 Essential (primary) hypertension: Secondary | ICD-10-CM

## 2022-08-14 MED ORDER — LISINOPRIL 40 MG PO TABS: 40.0000 mg | ORAL_TABLET | Freq: Every day | ORAL | 0 refills | Status: DC

## 2022-08-14 NOTE — Telephone Encounter (Signed)
Pt last seen February 2024 with last labs 08/04/2021.  Pt did not have follow up scheduled.  I've called pt and she has an appointment scheduled 09/14/22.  Pended a prescription for Lisinopril 40mg  to hold pt over until that appointment.

## 2022-08-15 DIAGNOSIS — H04123 Dry eye syndrome of bilateral lacrimal glands: Secondary | ICD-10-CM | POA: Diagnosis not present

## 2022-08-15 DIAGNOSIS — D3132 Benign neoplasm of left choroid: Secondary | ICD-10-CM | POA: Diagnosis not present

## 2022-08-15 DIAGNOSIS — H2511 Age-related nuclear cataract, right eye: Secondary | ICD-10-CM | POA: Diagnosis not present

## 2022-08-15 DIAGNOSIS — H2512 Age-related nuclear cataract, left eye: Secondary | ICD-10-CM | POA: Diagnosis not present

## 2022-08-15 DIAGNOSIS — H52223 Regular astigmatism, bilateral: Secondary | ICD-10-CM | POA: Diagnosis not present

## 2022-08-15 DIAGNOSIS — H2513 Age-related nuclear cataract, bilateral: Secondary | ICD-10-CM | POA: Diagnosis not present

## 2022-08-30 ENCOUNTER — Encounter (INDEPENDENT_AMBULATORY_CARE_PROVIDER_SITE_OTHER): Payer: Self-pay

## 2022-09-14 ENCOUNTER — Encounter: Payer: Self-pay | Admitting: Family Medicine

## 2022-09-14 ENCOUNTER — Ambulatory Visit (INDEPENDENT_AMBULATORY_CARE_PROVIDER_SITE_OTHER): Payer: Medicare PPO | Admitting: Family Medicine

## 2022-09-14 VITALS — BP 133/66 | HR 62 | Temp 97.7°F | Resp 16 | Ht 59.0 in | Wt 161.5 lb

## 2022-09-14 DIAGNOSIS — I700 Atherosclerosis of aorta: Secondary | ICD-10-CM | POA: Diagnosis not present

## 2022-09-14 DIAGNOSIS — E611 Iron deficiency: Secondary | ICD-10-CM | POA: Diagnosis not present

## 2022-09-14 DIAGNOSIS — I1A0 Resistant hypertension: Secondary | ICD-10-CM

## 2022-09-14 DIAGNOSIS — K219 Gastro-esophageal reflux disease without esophagitis: Secondary | ICD-10-CM | POA: Diagnosis not present

## 2022-09-14 DIAGNOSIS — R7309 Other abnormal glucose: Secondary | ICD-10-CM

## 2022-09-14 MED ORDER — SPIRONOLACTONE 25 MG PO TABS: 25.0000 mg | ORAL_TABLET | Freq: Every day | ORAL | 3 refills | Status: DC

## 2022-09-14 MED ORDER — ESOMEPRAZOLE MAGNESIUM 20 MG PO CPDR
DELAYED_RELEASE_CAPSULE | ORAL | 3 refills | Status: DC
Start: 2022-09-14 — End: 2023-09-04

## 2022-09-14 NOTE — Progress Notes (Signed)
SUBJECTIVE:   Chief Complaint  Patient presents with   Hypertension   HPI Patient presents to clinic for follow up HTN  No acute concerns today  Feeling great Was recently in [place] for holidays and reports kept well hydrated.  Hypertension Asymptomatic.  Blood pressure has been stable at home ranging from 120's-140's/60's-80's.  Compliant with current medications and tolerating well.  Was previously seen at HTN clinic but would prefer to stay in [place] area as increasing anxiety with drive to [place].  Had tried to switch to [facility] clinic but has not heard back to schedule appointment.     PERTINENT PMH / PSH: Resistant hypertension Hyperlipidemia Obesity class I   OBJECTIVE:  BP 133/66   Pulse 62   Temp 97.7 F (36.5 C)   Resp 16   Ht 4\' 11"  (1.499 m)   Wt 161 lb 8 oz (73.3 kg)   SpO2 98%   BMI 32.62 kg/m    Physical Exam Vitals reviewed.  Constitutional:      General: She is not in acute distress.    Appearance: Normal appearance. She is normal weight. She is not ill-appearing, toxic-appearing or diaphoretic.  HENT:     Mouth/Throat:     Mouth: Mucous membranes are moist.  Eyes:     General:        Right eye: No discharge.        Left eye: No discharge.     Conjunctiva/sclera: Conjunctivae normal.  Cardiovascular:     Rate and Rhythm: Normal rate and regular rhythm.     Heart sounds: Normal heart sounds.  Pulmonary:     Effort: Pulmonary effort is normal.     Breath sounds: Normal breath sounds.  Abdominal:     General: Bowel sounds are normal.  Musculoskeletal:        General: Normal range of motion.  Skin:    General: Skin is warm and dry.  Neurological:     General: No focal deficit present.     Mental Status: She is alert and oriented to person, place, and time. Mental status is at baseline.  Psychiatric:        Mood and Affect: Mood normal.        Behavior: Behavior normal.        Thought Content: Thought content normal.         Judgment: Judgment normal.       09/14/2022    2:46 PM 07/10/2022   10:33 AM 03/07/2022   10:28 AM 01/17/2022    9:49 AM 11/15/2021    2:07 PM  Depression screen PHQ 2/9  Decreased Interest 0 0 0 0 0  Down, Depressed, Hopeless 0 0 0 0 0  PHQ - 2 Score 0 0 0 0 0  Altered sleeping 0 0 0 0   Tired, decreased energy 0 0 0 0   Change in appetite 0 0 0 0   Feeling bad or failure about yourself  0 0 0 0   Trouble concentrating 0 0 0 0   Moving slowly or fidgety/restless 0 0 0 0   Suicidal thoughts 0 0 0 0   PHQ-9 Score 0 0 0 0   Difficult doing work/chores Not difficult at all Not difficult at all Not difficult at all Not difficult at all         09/14/2022    2:46 PM 03/07/2022   10:28 AM 01/17/2022    9:50 AM 03/28/2019    9:42  AM  GAD 7 : Generalized Anxiety Score  Nervous, Anxious, on Edge 0 0 0 0  Control/stop worrying 0 0 0 0  Worry too much - different things 0 0 0 0  Trouble relaxing 0 0 0 0  Restless 0 0 0 0  Easily annoyed or irritable 0 0 0 0  Afraid - awful might happen 0 0 0 0  Total GAD 7 Score 0 0 0 0  Anxiety Difficulty Not difficult at all Not difficult at all Not difficult at all Not difficult at all      ASSESSMENT/PLAN:  Resistant hypertension Assessment & Plan: Chronic.  Stable.  Well-controlled today.   Continue current medications. Refill Spironolactone Will continue to manage BP as patient unable to continue with HTN clinic.  Would like to have patient seen at [facility] clinic in future if possible. Check Cmet today Continue to monitor BP at home, goal <140/90     Orders: -     Spironolactone; Take 1 tablet (25 mg total) by mouth daily. In am See change in sig 1x per day  Dispense: 90 tablet; Refill: 3 -     Comprehensive metabolic panel  Aortic atherosclerosis (HCC) -     Lipid panel  Iron deficiency -     CBC  Gastroesophageal reflux disease, unspecified whether esophagitis present Assessment & Plan: Refill Emoprazole 20 mg  daily  Orders: -     Esomeprazole Magnesium; TAKE 1 CAPSULE DAILY AT 12 NOON  Dispense: 90 capsule; Refill: 3   PDMP reviewed  Return in about 6 months (around 03/17/2023), or if symptoms worsen or fail to improve, for PCP.  [name], MD

## 2022-09-14 NOTE — Patient Instructions (Addendum)
It was a pleasure meeting you today. Thank you for allowing me to take part in your health care.  Our goals for today as we discussed include:  We will get some labs today.  If they are abnormal or we need to do something about them, I will call you.  If they are normal, I will send you a message on MyChart (if it is active) or a letter in the mail.  If you don't hear from [place] in 2 weeks, please call the office at the number below.   Continue current medications  Refills sent for requested medications  Follow up in 6 months  Annual visit in 1 year  If you have any questions or concerns, please do not hesitate to call the office at [phone].  I look forward to our next visit and until then take care and stay safe.  Regards,   [name], MD   [facility]

## 2022-09-15 ENCOUNTER — Other Ambulatory Visit: Payer: Self-pay

## 2022-09-15 ENCOUNTER — Encounter: Payer: Self-pay | Admitting: Family Medicine

## 2022-09-15 ENCOUNTER — Observation Stay
Admission: EM | Admit: 2022-09-15 | Discharge: 2022-09-17 | Disposition: A | Discharge status: Home / Self Care | Payer: Medicare PPO | Attending: Internal Medicine | Admitting: Internal Medicine

## 2022-09-15 ENCOUNTER — Telehealth: Payer: Self-pay

## 2022-09-15 ENCOUNTER — Other Ambulatory Visit: Payer: Self-pay | Admitting: Family Medicine

## 2022-09-15 DIAGNOSIS — Z79899 Other long term (current) drug therapy: Secondary | ICD-10-CM | POA: Diagnosis not present

## 2022-09-15 DIAGNOSIS — I10 Essential (primary) hypertension: Secondary | ICD-10-CM | POA: Diagnosis present

## 2022-09-15 DIAGNOSIS — Z96652 Presence of left artificial knee joint: Secondary | ICD-10-CM | POA: Insufficient documentation

## 2022-09-15 DIAGNOSIS — E871 Hypo-osmolality and hyponatremia: Secondary | ICD-10-CM | POA: Diagnosis not present

## 2022-09-15 DIAGNOSIS — R7303 Prediabetes: Secondary | ICD-10-CM | POA: Diagnosis present

## 2022-09-15 DIAGNOSIS — Z8616 Personal history of COVID-19: Secondary | ICD-10-CM | POA: Insufficient documentation

## 2022-09-15 DIAGNOSIS — Z8542 Personal history of malignant neoplasm of other parts of uterus: Secondary | ICD-10-CM | POA: Diagnosis not present

## 2022-09-15 DIAGNOSIS — E611 Iron deficiency: Secondary | ICD-10-CM

## 2022-09-15 DIAGNOSIS — K219 Gastro-esophageal reflux disease without esophagitis: Secondary | ICD-10-CM | POA: Diagnosis present

## 2022-09-15 HISTORY — DX: Iron deficiency: E61.1

## 2022-09-15 LAB — LIPID PANEL
Cholesterol: 165 mg/dL (ref 0–200)
HDL: 51.4 mg/dL (ref >39.00)
LDL Cholesterol: 79 mg/dL (ref 0–99)
NonHDL: 113.85
Total CHOL/HDL Ratio: 3
Triglycerides: 176 mg/dL — ABNORMAL HIGH (ref 0.0–149.0)
VLDL: 35.2 mg/dL (ref 0.0–40.0)

## 2022-09-15 LAB — COMPREHENSIVE METABOLIC PANEL
ALT: 10 U/L (ref 0–35)
AST: 17 U/L (ref 0–37)
Albumin: 4.3 g/dL (ref 3.5–5.2)
Alkaline Phosphatase: 48 U/L (ref 39–117)
BUN: 20 mg/dL (ref 6–23)
CO2: 24 meq/L (ref 19–32)
Calcium: 10.1 mg/dL (ref 8.4–10.5)
Chloride: 89 mEq/L — ABNORMAL LOW (ref 96–112)
Creatinine, Ser: 0.85 mg/dL (ref 0.40–1.20)
GFR: 61.22 mL/min (ref >60.00)
Glucose, Bld: 94 mg/dL (ref 70–99)
Potassium: 5 meq/L (ref 3.5–5.1)
Sodium: 121 meq/L — CL (ref 135–145)
Total Bilirubin: 0.5 mg/dL (ref 0.2–1.2)
Total Protein: 6.4 g/dL (ref 6.0–8.3)

## 2022-09-15 LAB — CBC
HCT: 40.2 % (ref 36.0–46.0)
HCT: 39.5 % (ref 36.0–46.0)
HCT: 43.8 % (ref 36.0–46.0)
Hemoglobin: 13.2 g/dL (ref 12.0–15.0)
Hemoglobin: 13.3 g/dL (ref 12.0–15.0)
Hemoglobin: 14.4 g/dL (ref 12.0–15.0)
MCH: 31.1 pg (ref 26.0–34.0)
MCH: 30.9 pg (ref 26.0–34.0)
MCHC: 32.7 g/dL (ref 30.0–36.0)
MCHC: 33.7 g/dL (ref 30.0–36.0)
MCHC: 32.9 g/dL (ref 30.0–36.0)
MCV: 94.3 fl (ref 78.0–100.0)
MCV: 92.5 fL (ref 80.0–100.0)
MCV: 94 fL (ref 80.0–100.0)
Platelets: 270 10*3/uL (ref 150.0–400.0)
Platelets: 291 10*3/uL (ref 150–400)
Platelets: 302 10*3/uL (ref 150–400)
RBC: 4.26 Mil/uL (ref 3.87–5.11)
RBC: 4.27 MIL/uL (ref 3.87–5.11)
RBC: 4.66 MIL/uL (ref 3.87–5.11)
RDW: 12.6 % (ref 11.5–15.5)
RDW: 12.4 % (ref 11.5–15.5)
RDW: 12.3 % (ref 11.5–15.5)
WBC: 6.7 10*3/uL (ref 4.0–10.5)
WBC: 7.4 10*3/uL (ref 4.0–10.5)
WBC: 9.1 10*3/uL (ref 4.0–10.5)
nRBC: 0 % (ref 0.0–0.2)
nRBC: 0 % (ref 0.0–0.2)

## 2022-09-15 LAB — URINALYSIS, ROUTINE W REFLEX MICROSCOPIC
Bilirubin Urine: NEGATIVE
Glucose, UA: NEGATIVE mg/dL
Hgb urine dipstick: NEGATIVE
Ketones, ur: NEGATIVE mg/dL
Leukocytes,Ua: NEGATIVE
Nitrite: NEGATIVE
Protein, ur: NEGATIVE mg/dL
Specific Gravity, Urine: 1.006 (ref 1.005–1.030)
pH: 7 (ref 5.0–8.0)

## 2022-09-15 LAB — BASIC METABOLIC PANEL
Anion gap: 10 (ref 5–15)
BUN: 23 mg/dL (ref 8–23)
CO2: 21 mmol/L — ABNORMAL LOW (ref 22–32)
Calcium: 9.4 mg/dL (ref 8.9–10.3)
Chloride: 95 mmol/L — ABNORMAL LOW (ref 98–111)
Creatinine, Ser: 0.86 mg/dL (ref 0.44–1.00)
GFR, Estimated: >60 mL/min (ref >60)
Glucose, Bld: 107 mg/dL — ABNORMAL HIGH (ref 70–99)
Potassium: 5.2 mmol/L — ABNORMAL HIGH (ref 3.5–5.1)
Sodium: 126 mmol/L — ABNORMAL LOW (ref 135–145)

## 2022-09-15 LAB — OSMOLALITY, URINE: Osmolality, Ur: 319 mOsm/kg (ref 300–900)

## 2022-09-15 LAB — OSMOLALITY: Osmolality: 273 mOsm/kg — ABNORMAL LOW (ref 275–295)

## 2022-09-15 LAB — CREATININE, SERUM
Creatinine, Ser: 0.82 mg/dL (ref 0.44–1.00)
GFR, Estimated: >60 mL/min (ref >60)

## 2022-09-15 LAB — SODIUM, URINE, RANDOM: Sodium, Ur: 92 mmol/L

## 2022-09-15 MED ORDER — SODIUM CHLORIDE 0.9 % IV SOLN: INTRAVENOUS | Status: DC

## 2022-09-15 MED ORDER — ACETAMINOPHEN 650 MG RE SUPP: 650.0000 mg | Freq: Four times a day (QID) | RECTAL | Status: DC | PRN

## 2022-09-15 MED ORDER — HEPARIN SODIUM (PORCINE) 5000 UNIT/ML IJ SOLN
5000.0000 [IU] | Freq: Three times a day (TID) | INTRAMUSCULAR | Status: DC
  Administered 2022-09-15 – 2022-09-17 (×5): 5000 [IU] via SUBCUTANEOUS
  Filled 2022-09-15 (×5): qty 1

## 2022-09-15 MED ORDER — NEBIVOLOL HCL 5 MG PO TABS
2.5000 mg | ORAL_TABLET | Freq: Every day | ORAL | Status: DC
  Administered 2022-09-16 – 2022-09-17 (×3): 2.5 mg via ORAL
  Filled 2022-09-15 (×4): qty 1

## 2022-09-15 MED ORDER — ACETAMINOPHEN 325 MG PO TABS
650.0000 mg | ORAL_TABLET | Freq: Four times a day (QID) | ORAL | Status: DC | PRN
  Administered 2022-09-16 – 2022-09-17 (×3): 650 mg via ORAL
  Filled 2022-09-15 (×3): qty 2

## 2022-09-15 MED ORDER — SODIUM CHLORIDE 0.9% FLUSH
3.0000 mL | Freq: Two times a day (BID) | INTRAVENOUS | Status: DC
  Administered 2022-09-16 – 2022-09-17 (×2): 3 mL via INTRAVENOUS

## 2022-09-15 MED ORDER — SODIUM CHLORIDE 0.9 % IV SOLN: Freq: Once | INTRAVENOUS | Status: DC

## 2022-09-15 MED ORDER — NEBIVOLOL HCL 2.5 MG PO TABS
2.5000 mg | ORAL_TABLET | Freq: Every day | ORAL | Status: DC
  Filled 2022-09-15: qty 1

## 2022-09-15 NOTE — Assessment & Plan Note (Signed)
Vitals:   09/15/22 1547 09/15/22 1853  BP: (!) 186/76 (!) 169/77   Vitals:   09/15/22 1547 09/15/22 1853  BP: (!) 186/76 (!) 169/77  Pulse: 69 (!) 59  Temp: 98.7 F (37.1 C) 98.3 F (36.8 C)  Resp: 17 16  SpO2: 99% 99%  TempSrc: Oral Oral  Patient's heart rate is limiting as to increasing her Coreg dose. Will continue patient on current dose of Coreg at 3.125, clonidine patch 0.1 mg, lisinopril 40 mg daily, bystolic low dose trial tonight as it does not cause bradycardia typically and is effective for BP control . Or we may can try hydralazine

## 2022-09-15 NOTE — ED Notes (Signed)
Pt. To ED for abnormal lab results. Pt. Was informed her sodium was 121, pt. States no symptoms, NAD.

## 2022-09-15 NOTE — Assessment & Plan Note (Signed)
Chronic.  Stable.  Well-controlled today.   Continue current medications. Refill Spironolactone Will continue to manage BP as patient unable to continue with HTN clinic.  Would like to have patient seen at [facility] clinic in future if possible. Check Cmet today Continue to monitor BP at home, goal <140/90

## 2022-09-15 NOTE — Assessment & Plan Note (Signed)
>>  ASSESSMENT AND PLAN FOR HYPONATREMIA WRITTEN ON 09/16/2022  2:11 AM BY PATEL, EKTA V, MD  Patient has history of chronic hyponatremia since as far as 2014 per our chart.  Her levels have always been over 120s.  Medications have been changed in the past and hyponatremia has been attributed to diuretic therapy as well.  11/07/12 12:58 07/26/16 08:13 08/09/16 06:13 08/10/16 03:22 08/11/16 03:35 05/06/18 20:09 08/09/18 08:54 09/09/18 09:35 10/17/18 09:05 03/13/19 14:24 08/03/20 07:33 12/08/20 16:36 03/03/21 08:42 08/04/21 07:36 09/14/22 15:29 09/15/22 15:49  Sodium 128 (L) 124 (L) 130 (L) 123 (L) 120 (L) 127 (L) 132 (L) 131 (L) 123 (L) 131 (L) 127 (L) 134 (L) 134 (L) 131 (L) 121 (LL) 126 (L)   Today patient is clinically euvolemic, serum osmolality is 273, urine osmolality is 319 and urine sodium is 92 concerning for SIADH causing the hyponatremia.  We will do fluid restriction trial of 800 ml for 1 day and if ineffective start sodium chloride  tabs. And Nephrology consult.

## 2022-09-15 NOTE — H&P (Signed)
History and Physical    Patient: Jill Stanley:[id number] DOB: [birth year] DOA: 09/15/2022 DOS: the patient was seen and examined on 09/16/2022 PCP: [name], MD  Patient coming from: Home   Chief Complaint:  Chief Complaint  Patient presents with   abnormal labs    HPI: Jill Stanley is a 89 y.o. female with medical history significant for chronic hyponatremia, GERD, hypertension, prediabetes, gastritis presenting with hyponatremia of 121.  Patient denies any headaches blurred vision dizziness nausea vomiting or any other symptoms today.  States that she is doing well but her PCP was extremely concerned about her sodium and asked her to come to the hospital.  She has seen Dr. Thedore Mins for her high blood pressure but not for her sodium. In the emergency room patient is alert awake oriented afebrile no distress ambulatory and cooperative.  Vitals show intermittent sinus bradycardia but elevated blood pressures.  Initial BMP shows sodium of 126 potassium 5.2 glucose 107 normal kidney function hepatic panel pending.  Serum osmolality of 273, normal CBC, urine osmolality of 319 and urine sodium of 92.  Patient is clinically euvolemic. Review of Systems: Review of Systems  All other systems reviewed and are negative.  Past Medical History:  Diagnosis Date   Anal pain 03/28/2019   Arthritis    Chronic gastritis without bleeding 07/29/2020   Chronic low back pain    Complication of anesthesia    "All my muscles were sore all over my body was sore the next day.. due to mixture of gases"   COVID-19    03/2021   Endometrial cancer (HCC)    Epigastric pain 07/29/2020   GERD (gastroesophageal reflux disease)    Heart murmur 2018   tiney murmur, no treatment needed   Hypertension    Hyponatremia    chronic    Iron deficiency 09/15/2022   NONSPECIFIC ABN FINDING RAD & OTH EXAM GI TRACT 07/06/2009   Qualifier: Diagnosis of  By: Melvyn Neth CMA (AAMA), Patty     Osteopenia    Prediabetes     Right foot pain 03/28/2019   RLS (restless legs syndrome)    S/P total knee arthroplasty 08/09/2016   Ulcer of the stomach caused by bacteria (H. pylori)    history of   Ulcer of the stomach caused by bacteria (H. pylori)    history of   Past Surgical History:  Procedure Laterality Date   ABDOMINAL HYSTERECTOMY  1992   BACK SURGERY  1989   L3, 4 and 5   DILATION AND CURETTAGE OF UTERUS  1992   KNEE ARTHROPLASTY Left 08/09/2016   Procedure: COMPUTER ASSISTED TOTAL KNEE ARTHROPLASTY;  Surgeon: Donato Heinz, MD;  Location: [facility] ORS;  Service: Orthopedics;  Laterality: Left;   KNEE ARTHROSCOPY W/ MENISCAL REPAIR Left    Social History:   reports that she has never smoked. She has never used smokeless tobacco. She reports current alcohol use. She reports that she does not use drugs.  Allergies  Allergen Reactions   Amlodipine Swelling   Hydralazine     Rash   Hydralazine Hcl     Rash    Norvasc [Amlodipine Besylate]     5 mg leg swelling     Procardia Xl [Nifedipine Er]     Leg swelling/ankle    Epinephrine Anxiety    Hyperactivity   Ibuprofen Other (See Comments)    Cannot take due to GI ulcer   Lovastatin Diarrhea, Nausea Only and Nausea And Vomiting  Morphine Nausea Only   Tramadol Nausea Only    Caused nausea, "felt awful all over", and med did not help with pain "Felt awful all over", and med did not help with pain   Zocor [Simvastatin] Diarrhea, Nausea And Vomiting and Nausea Only    Nausea and vomiting      Family History  Problem Relation Age of Onset   Breast cancer Daughter 82   Aneurysm Grandson        brain age 25 y.o died 2019-09-10   Alzheimer's disease Father     Prior to Admission medications   Medication Sig Start Date End Date Taking? Authorizing Provider  acetaminophen (TYLENOL) 650 MG CR tablet Take 1,300 mg by mouth every 8 (eight) hours as needed for pain.    [provider]  Calcium Carbonate-Vitamin D3 600-400 MG-UNIT TABS Take  2 tablets by mouth daily.     [provider]  calcium elemental as carbonate (BARIATRIC TUMS ULTRA) 400 MG chewable tablet Chew 1,000 mg by mouth as needed for heartburn.    [provider]  carvedilol (COREG) 3.125 MG tablet Take 1 tablet (3.125 mg total) by mouth 2 (two) times daily with a meal. 07/10/22   [name], MD  cloNIDine (CATAPRES) 0.1 MG tablet Take 1 tablet (0.1 mg total) by mouth in the morning and at bedtime. 05/30/22   [name], MD  cyanocobalamin (VITAMIN B12) 250 MCG tablet Take 250 mcg by mouth daily.    [provider]  esomeprazole (NEXIUM) 20 MG capsule TAKE 1 CAPSULE DAILY AT 12 NOON 09/14/22   [name], MD  ezetimibe (ZETIA) 10 MG tablet Take 1 tablet (10 mg total) by mouth daily. 07/10/22   [name], MD  lisinopril (ZESTRIL) 40 MG tablet Take 1 tablet (40 mg total) by mouth daily. 08/14/22   [name], MD  RESTASIS 0.05 % ophthalmic emulsion Place 1 drop into both eyes 2 (two) times daily. 08/21/22   [provider]  sodium chloride (OCEAN) 0.65 % SOLN nasal spray Place 2 sprays into both nostrils as needed for congestion.    [provider]  solifenacin (VESICARE) 5 MG tablet TAKE 1 TABLET EVERY DAY 07/05/22   [name], MD  spironolactone (ALDACTONE) 25 MG tablet Take 1 tablet (25 mg total) by mouth daily. In am See change in sig 1x per day 09/14/22   [name], MD  VITAMIN D PO Take 2,000 Units by mouth daily.     [provider]     Vitals:   09/15/22 1900 09/15/22 2100 09/15/22 2200 09/15/22 2322  BP: (!) 167/74 (!) 168/67 (!) 175/76 (!) 174/63  Pulse: (!) 57 (!) 57 (!) 58 67  Resp:    18  Temp:    (!) 97.5 F (36.4 C)  TempSrc:    Oral  SpO2: 98% 96% 96% 99%   Physical Exam Vitals and nursing note reviewed.  Constitutional:      General: She is not in acute distress. HENT:     Head: Normocephalic and atraumatic.     Right Ear: Hearing normal.     Left Ear: Hearing normal.     Nose:  Nose normal. No nasal deformity.     Mouth/Throat:     Lips: Pink.     Tongue: No lesions.     Pharynx: Oropharynx is clear.  Eyes:     General: Lids are normal.     Extraocular Movements: Extraocular movements intact.  Cardiovascular:  Rate and Rhythm: Normal rate and regular rhythm.     Heart sounds: Normal heart sounds.  Pulmonary:     Effort: Pulmonary effort is normal.     Breath sounds: Normal breath sounds.  Abdominal:     General: Bowel sounds are normal. There is no distension.     Palpations: Abdomen is soft. There is no mass.     Tenderness: There is no abdominal tenderness.  Musculoskeletal:     Right lower leg: No edema.     Left lower leg: No edema.  Skin:    General: Skin is warm.  Neurological:     General: No focal deficit present.     Mental Status: She is alert and oriented to person, place, and time.     Cranial Nerves: Cranial nerves 2-12 are intact.  Psychiatric:        Attention and Perception: Attention normal.        Mood and Affect: Mood normal.        Speech: Speech normal.        Behavior: Behavior normal. Behavior is cooperative.      Labs on Admission: I have personally reviewed following labs and imaging studies  CBC: Recent Labs  Lab 09/14/22 1529 09/15/22 1549 09/15/22 2309  WBC 6.7 7.4 9.1  HGB 13.2 13.3 14.4  HCT 40.2 39.5 43.8  MCV 94.3 92.5 94.0  PLT 270.0 291 302   Basic Metabolic Panel: Recent Labs  Lab 09/14/22 1529 09/15/22 1549 09/15/22 2309  NA 121* 126*  --   K 5.0 5.2*  --   CL 89* 95*  --   CO2 24 21*  --   GLUCOSE 94 107*  --   BUN 20 23  --   CREATININE 0.85 0.86 0.82  CALCIUM 10.1 9.4  --    GFR: Estimated Creatinine Clearance: 41.3 mL/min (by C-G formula based on SCr of 0.82 mg/dL). Liver Function Tests: Recent Labs  Lab 09/14/22 1529  AST 17  ALT 10  ALKPHOS 48  BILITOT 0.5  PROT 6.4  ALBUMIN 4.3   No results for input(s): "LIPASE", "AMYLASE" in the last 168 hours. No results for  input(s): "AMMONIA" in the last 168 hours. Coagulation Profile: No results for input(s): "INR", "PROTIME" in the last 168 hours. Cardiac Enzymes: No results for input(s): "CKTOTAL", "CKMB", "CKMBINDEX", "TROPONINI" in the last 168 hours. BNP (last 3 results) No results for input(s): "PROBNP" in the last 8760 hours. HbA1C: No results for input(s): "HGBA1C" in the last 72 hours. CBG: No results for input(s): "GLUCAP" in the last 168 hours. Lipid Profile: Recent Labs    09/14/22 1529  CHOL 165  HDL 51.40  LDLCALC 79  TRIG 176.0*  CHOLHDL 3   Thyroid Function Tests: No results for input(s): "TSH", "T4TOTAL", "FREET4", "T3FREE", "THYROIDAB" in the last 72 hours. Anemia Panel: No results for input(s): "VITAMINB12", "FOLATE", "FERRITIN", "TIBC", "IRON", "RETICCTPCT" in the last 72 hours. Urinalysis    Component Value Date/Time   COLORURINE STRAW (A) 09/15/2022 1928   APPEARANCEUR CLEAR (A) 09/15/2022 1928   APPEARANCEUR Clear 08/09/2018 0854   LABSPEC 1.006 09/15/2022 1928   PHURINE 7.0 09/15/2022 1928   GLUCOSEU NEGATIVE 09/15/2022 1928   HGBUR NEGATIVE 09/15/2022 1928   BILIRUBINUR NEGATIVE 09/15/2022 1928   BILIRUBINUR Negative 08/09/2018 0854   KETONESUR NEGATIVE 09/15/2022 1928   PROTEINUR NEGATIVE 09/15/2022 1928   NITRITE NEGATIVE 09/15/2022 1928   LEUKOCYTESUR NEGATIVE 09/15/2022 1928      Unresulted Labs (From  admission, onward)     Start     Ordered   09/16/22 0500  Comprehensive metabolic panel  Tomorrow morning,   R        09/15/22 2228   09/16/22 0500  CBC  Tomorrow morning,   R        09/15/22 2228   09/15/22 2229  Hemoglobin A1c  Add-on,   AD        09/15/22 2228            Medications  0.9 %  sodium chloride infusion (0 mLs Intravenous Stopped 09/15/22 2011)  heparin injection 5,000 Units (5,000 Units Subcutaneous Given 09/15/22 2317)  sodium chloride flush (NS) 0.9 % injection 3 mL (3 mLs Intravenous Not Given 09/15/22 2315)  0.9 %  sodium  chloride infusion ( Intravenous New Bag/Given 09/15/22 2316)  acetaminophen (TYLENOL) tablet 650 mg (has no administration in time range)    Or  acetaminophen (TYLENOL) suppository 650 mg (has no administration in time range)  nebivolol (BYSTOLIC) tablet 2.5 mg (2.5 mg Oral Given 09/16/22 0006)    Radiological Exams on Admission: No results found.   Data Reviewed: Relevant notes from primary care and specialist visits, past discharge summaries as available in EHR, including Care Everywhere. Prior diagnostic testing as pertinent to current admission diagnoses Updated medications and problem lists for reconciliation ED course, including vitals, labs, imaging, treatment and response to treatment Triage notes, nursing and pharmacy notes and ED provider's notes Notable results as noted in HPI  Assessment and Plan: * Hyponatremia Patient has history of chronic hyponatremia since as far as 2014 per our chart.  Her levels have always been over 120s.  Medications have been changed in the past and hyponatremia has been attributed to diuretic therapy as well.  11/07/12 12:58 07/26/16 08:13 08/09/16 06:13 08/10/16 03:22 08/11/16 03:35 05/06/18 20:09 08/09/18 08:54 09/09/18 09:35 10/17/18 09:05 03/13/19 14:24 08/03/20 07:33 12/08/20 16:36 03/03/21 08:42 08/04/21 07:36 09/14/22 15:29 09/15/22 15:49  Sodium 128 (L) 124 (L) 130 (L) 123 (L) 120 (L) 127 (L) 132 (L) 131 (L) 123 (L) 131 (L) 127 (L) 134 (L) 134 (L) 131 (L) 121 (LL) 126 (L)   Today patient is clinically euvolemic, serum osmolality is 273, urine osmolality is 319 and urine sodium is 92 concerning for SIADH causing the hyponatremia.  We will do fluid restriction trial of 800 ml for 1 day and if ineffective start sodium chloride tabs. And Nephrology consult.   Hypertension Vitals:   09/15/22 1547 09/15/22 1853  BP: (!) 186/76 (!) 169/77   Vitals:   09/15/22 1547 09/15/22 1853  BP: (!) 186/76 (!) 169/77  Pulse: 69 (!) 59  Temp: 98.7 F  (37.1 C) 98.3 F (36.8 C)  Resp: 17 16  SpO2: 99% 99%  TempSrc: Oral Oral  Patient's heart rate is limiting as to increasing her Coreg dose. Will continue patient on current dose of Coreg at 3.125, clonidine patch 0.1 mg, lisinopril 40 mg daily, bystolic low dose trial tonight as it does not cause bradycardia typically and is effective for BP control . Or we may can try hydralazine     GERD (gastroesophageal reflux disease) Continue patient on IV PPI therapy.  Prediabetes Will obtain an A1c. Carb consistent diet.   DVT prophylaxis:  Heparin   Consults:  None   Advance Care Planning:    Code Status: Full Code   Family Communication:  None   Disposition Plan:  Home   Severity of Illness: The appropriate  patient status for this patient is OBSERVATION. Observation status is judged to be reasonable and necessary in order to provide the required intensity of service to ensure the patient's safety. The patient's presenting symptoms, physical exam findings, and initial radiographic and laboratory data in the context of their medical condition is felt to place them at decreased risk for further clinical deterioration. Furthermore, it is anticipated that the patient will be medically stable for discharge from the hospital within 2 midnights of admission.   Author: Gertha Calkin, MD 09/16/2022 2:11 AM  For on call review [email or web address].

## 2022-09-15 NOTE — Assessment & Plan Note (Signed)
Patient has history of chronic hyponatremia since as far as 2014 per our chart.  Her levels have always been over 120s.  Medications have been changed in the past and hyponatremia has been attributed to diuretic therapy as well.  11/07/12 12:58 07/26/16 08:13 08/09/16 06:13 08/10/16 03:22 08/11/16 03:35 05/06/18 20:09 08/09/18 08:54 09/09/18 09:35 10/17/18 09:05 03/13/19 14:24 08/03/20 07:33 12/08/20 16:36 03/03/21 08:42 08/04/21 07:36 09/14/22 15:29 09/15/22 15:49  Sodium 128 (L) 124 (L) 130 (L) 123 (L) 120 (L) 127 (L) 132 (L) 131 (L) 123 (L) 131 (L) 127 (L) 134 (L) 134 (L) 131 (L) 121 (LL) 126 (L)  W Today patient is clinically euvolemic, serum osmolality is 273, urine osmolality is 319 and urine sodium is 92 concerning for SIADH causing the hyponatremia.

## 2022-09-15 NOTE — Hospital Course (Signed)
Pt runs in 130's and aldactone and

## 2022-09-15 NOTE — Telephone Encounter (Signed)
Spoke to Dr. Birdie Sons verbally and he advised that pt go to the the ED. I have spoke with pt and she stated that she will get ready and head over there.

## 2022-09-15 NOTE — ED Provider Notes (Signed)
[facility] Provider Note    Event Date/Time   First MD Initiated Contact with Patient 09/15/22 1835     (approximate)   History   abnormal labs   HPI  Jill Stanley is a 89 y.o. female here with hyponatremia.  The patient was sent in after her PCP visit showed a sodium of 121.  The patient states that she was relatively recently started on spironolactone because of her blood pressure at been going up after hydrochlorothiazide was stopped.  She has a history of mild hyponatremia but has never been this low.  Denies any tremors.  Denies any seizures.  No complaints.     Physical Exam   Triage Vital Signs: ED Triage Vitals [09/15/22 1547]  Encounter Vitals Group     BP (!) 186/76     Systolic BP Percentile      Diastolic BP Percentile      Pulse Rate 69     Resp 17     Temp 98.7 F (37.1 C)     Temp Source Oral     SpO2 99 %     Weight      Height      Head Circumference      Peak Flow      Pain Score 0     Pain Loc      Pain Education      Exclude from Growth Chart     Most recent vital signs: Vitals:   09/15/22 1547 09/15/22 1853  BP: (!) 186/76 (!) 169/77  Pulse: 69 (!) 59  Resp: 17 16  Temp: 98.7 F (37.1 C) 98.3 F (36.8 C)  SpO2: 99% 99%     General: Awake, no distress.  CV:  Good peripheral perfusion.  Regular rate and rhythm.  No murmurs. Resp:  Normal work of breathing.  Lungs clear to auscultation bilaterally. Abd:  No distention.  No tenderness. Other:  Moist mucous membranes.  Alert, oriented.  Strength out of 5 bilateral upper and lower extremities.  Normal station to light touch.   ED Results / Procedures / Treatments   Labs (all labs ordered are listed, but only abnormal results are displayed) Labs Reviewed  BASIC METABOLIC PANEL - Abnormal; Notable for the following components:      Result Value   Sodium 126 (*)    Potassium 5.2 (*)    Chloride 95 (*)    CO2 21 (*)    Glucose, Bld 107 (*)    All other  components within normal limits  OSMOLALITY - Abnormal; Notable for the following components:   Osmolality 273 (*)    All other components within normal limits  URINALYSIS, ROUTINE W REFLEX MICROSCOPIC - Abnormal; Notable for the following components:   Color, Urine STRAW (*)    APPearance CLEAR (*)    All other components within normal limits  CBC  OSMOLALITY, URINE  SODIUM, URINE, RANDOM     EKG    RADIOLOGY    I also independently reviewed and agree with radiologist interpretations.   PROCEDURES:  Critical Care performed: No   MEDICATIONS ORDERED IN ED: Medications  0.9 %  sodium chloride infusion ( Intravenous New Bag/Given 09/15/22 2011)     IMPRESSION / MDM / ASSESSMENT AND PLAN / ED COURSE  I reviewed the triage vital signs and the nursing notes.  Differential diagnosis includes, but is not limited to, hyponatremia secondary to medication, SIADH, mild dehydration, CHF, poor p.o. intake  Patient's presentation is most consistent with acute presentation with potential threat to life or bodily function.  The patient is on the cardiac monitor to evaluate for evidence of arrhythmia and/or significant heart rate changes   89 year old female here with significant hyponatremia.  Patient baseline sodium 1 30-1 35, but she had routine labs drawn yesterday which showed a sodium of 121.  It is 126 on repeat here.  She also has mild hyperkalemia.  She was recently started on spironolactone which I suspect is causing the acute change in her baseline sodium.  Given the severity of hyponatremia, will plan to admit for observations.  Will start cautious fluids and plan to hold her spironolactone.  Lab work for further workup sent and is pending.  Patient is hemodynamically stable.  No tremors or CNS changes.   FINAL CLINICAL IMPRESSION(S) / ED DIAGNOSES   Final diagnoses:  Hyponatremia     Rx / DC Orders   ED Discharge Orders     None         Note:  This document was prepared using Dragon voice recognition software and may include unintentional dictation errors.   Shaune Pollack, MD 09/15/22 2055

## 2022-09-15 NOTE — ED Triage Notes (Signed)
Pt comes with c/o abnormal NA levels. Pt states she was told it was 121. Pt states no symptoms and she feels fine.

## 2022-09-15 NOTE — Telephone Encounter (Signed)
CRITICAL VALUE STICKER  CRITICAL VALUE: Sodium 121  RECEIVER (on-site recipient of call): Shanda Bumps, CMA  DATE & TIME NOTIFIED: 09/15/2022 @ 2:49  MESSENGER (representative from lab): Saa  MD NOTIFIED: Dr. Birdie Sons  TIME OF NOTIFICATION: 2:50 pm  RESPONSE:

## 2022-09-15 NOTE — Assessment & Plan Note (Signed)
Will obtain an A1c. Carb consistent diet.

## 2022-09-15 NOTE — Plan of Care (Signed)

## 2022-09-15 NOTE — Assessment & Plan Note (Signed)
Continue patient on IV PPI therapy.

## 2022-09-15 NOTE — Assessment & Plan Note (Signed)
Refill Emoprazole 20 mg daily

## 2022-09-16 DIAGNOSIS — E871 Hypo-osmolality and hyponatremia: Secondary | ICD-10-CM | POA: Diagnosis not present

## 2022-09-16 LAB — COMPREHENSIVE METABOLIC PANEL
ALT: 12 U/L (ref 0–44)
AST: 19 U/L (ref 15–41)
Albumin: 3.8 g/dL (ref 3.5–5.0)
Alkaline Phosphatase: 37 U/L — ABNORMAL LOW (ref 38–126)
Anion gap: 8 (ref 5–15)
BUN: 16 mg/dL (ref 8–23)
CO2: 22 mmol/L (ref 22–32)
Calcium: 9.2 mg/dL (ref 8.9–10.3)
Chloride: 99 mmol/L (ref 98–111)
Creatinine, Ser: 0.72 mg/dL (ref 0.44–1.00)
GFR, Estimated: >60 mL/min (ref >60)
Glucose, Bld: 87 mg/dL (ref 70–99)
Potassium: 4.2 mmol/L (ref 3.5–5.1)
Sodium: 129 mmol/L — ABNORMAL LOW (ref 135–145)
Total Bilirubin: 0.4 mg/dL (ref 0.3–1.2)
Total Protein: 6.4 g/dL — ABNORMAL LOW (ref 6.5–8.1)

## 2022-09-16 LAB — CBC
HCT: 38 % (ref 36.0–46.0)
Hemoglobin: 13.1 g/dL (ref 12.0–15.0)
MCH: 31 pg (ref 26.0–34.0)
MCHC: 34.5 g/dL (ref 30.0–36.0)
MCV: 90 fL (ref 80.0–100.0)
Platelets: 267 10*3/uL (ref 150–400)
RBC: 4.22 MIL/uL (ref 3.87–5.11)
RDW: 12.1 % (ref 11.5–15.5)
WBC: 6.9 10*3/uL (ref 4.0–10.5)
nRBC: 0 % (ref 0.0–0.2)

## 2022-09-16 LAB — HEMOGLOBIN A1C
Hgb A1c MFr Bld: 6 % — ABNORMAL HIGH (ref 4.8–5.6)
Mean Plasma Glucose: 125.5 mg/dL

## 2022-09-16 MED ORDER — SIMETHICONE 80 MG PO CHEW
80.0000 mg | CHEWABLE_TABLET | Freq: Four times a day (QID) | ORAL | Status: DC | PRN
  Filled 2022-09-16: qty 1

## 2022-09-16 MED ORDER — HYDROXYZINE HCL 10 MG PO TABS
10.0000 mg | ORAL_TABLET | Freq: Three times a day (TID) | ORAL | Status: DC | PRN
  Filled 2022-09-16: qty 1

## 2022-09-16 MED ORDER — MELATONIN 5 MG PO TABS
2.5000 mg | ORAL_TABLET | Freq: Every day | ORAL | Status: DC
  Administered 2022-09-16: 2.5 mg via ORAL
  Filled 2022-09-16: qty 1

## 2022-09-16 MED ORDER — HYDRALAZINE HCL 20 MG/ML IJ SOLN
10.0000 mg | Freq: Four times a day (QID) | INTRAMUSCULAR | Status: DC | PRN
  Administered 2022-09-16: 10 mg via INTRAVENOUS
  Filled 2022-09-16: qty 1

## 2022-09-16 MED ORDER — MELATONIN 3 MG PO TABS
3.0000 mg | ORAL_TABLET | Freq: Every day | ORAL | Status: DC
  Filled 2022-09-16: qty 1

## 2022-09-16 NOTE — Progress Notes (Signed)
Patient came to unit via wheelchair by transport personnel. Alert and oriented x 4. I.v fluid infusing to LF AC, same intact. No complaint. Made comfortable in room. Call bell given

## 2022-09-16 NOTE — Progress Notes (Signed)
Progress Note   Patient: Jill Stanley ZOX:[id number] DOB: [birth year] DOA: 09/15/2022     0 DOS: the patient was seen and examined on 09/16/2022   Brief hospital course: 89 y.o. female with medical history significant for chronic hyponatremia, GERD, hypertension, prediabetes, gastritis presenting with hyponatremia of 121.  Patient denies any headaches blurred vision dizziness nausea vomiting or any other symptoms today.  States that she is doing well but her PCP was extremely concerned about her sodium and asked her to come to the hospital.  She has seen Dr. Thedore Mins for her high blood pressure but not for her sodium. In the emergency room patient is alert awake oriented afebrile no distress ambulatory and cooperative.  Vitals show intermittent sinus bradycardia but elevated blood pressures.  Initial BMP shows sodium of 126 potassium 5.2 glucose 107 normal kidney function hepatic panel pending.  Serum osmolality of 273, normal CBC, urine osmolality of 319 and urine sodium of 92.  Patient is clinically euvolemic.  Assessment and Plan: * Hyponatremia Patient has history of chronic hyponatremia since as far as 2014 per our chart.  Her levels have always been over 120s.  Medications have been changed in the past and hyponatremia has been attributed to diuretic therapy as well.  11/07/12 12:58 07/26/16 08:13 08/09/16 06:13 08/10/16 03:22 08/11/16 03:35 05/06/18 20:09 08/09/18 08:54 09/09/18 09:35 10/17/18 09:05 03/13/19 14:24 08/03/20 07:33 12/08/20 16:36 03/03/21 08:42 08/04/21 07:36 09/14/22 15:29 09/15/22 15:49  Sodium 128 (L) 124 (L) 130 (L) 123 (L) 120 (L) 127 (L) 132 (L) 131 (L) 123 (L) 131 (L) 127 (L) 134 (L) 134 (L) 131 (L) 121 (LL) 126 (L)   Today patient is clinically euvolemic, serum osmolality is 273, urine osmolality is 319 and urine sodium is 92 concerning for SIADH causing the hyponatremia.  -Patient with sodium of 129 this morning.  Improving.  Will continue to do fluid restriction.   -Salt  tablets if necessary.   Hypertension Vitals:   09/15/22 1547 09/15/22 1853  BP: (!) 186/76 (!) 169/77   Vitals:   09/15/22 1547 09/15/22 1853  BP: (!) 186/76 (!) 169/77  Pulse: 69 (!) 59  Temp: 98.7 F (37.1 C) 98.3 F (36.8 C)  Resp: 17 16  SpO2: 99% 99%  TempSrc: Oral Oral  Patient's heart rate is limiting as to increasing her Coreg dose. Will continue patient on current dose of Coreg at 3.125, clonidine patch 0.1 mg, lisinopril 40 mg daily, bystolic low dose trial tonight as it does not cause bradycardia typically and is effective for BP control . Or we may can try hydralazine  GERD (gastroesophageal reflux disease) Continue patient on IV PPI therapy.  Prediabetes Will obtain an A1c. Carb consistent diet.     Subjective: No active complaint.  Had no complaint prior to presentation.  No complaint of confusion or dizziness.  Physical Exam: Vitals:   09/16/22 0313 09/16/22 0329 09/16/22 0829 09/16/22 1200  BP: (!) 182/75 (!) 165/87 (!) 179/72 (!) 149/75  Pulse: 65 66 62 61  Resp: 18  16 20   Temp: 98 F (36.7 C)  97.9 F (36.6 C) 98.3 F (36.8 C)  TempSrc: Oral  Oral   SpO2: 98%  98% 98%   Physical Exam Constitutional:      Appearance: Normal appearance.  HENT:     Right Ear: Tympanic membrane normal.     Nose: Nose normal.     Mouth/Throat:     Mouth: Mucous membranes are dry.  Eyes:  Extraocular Movements: Extraocular movements intact.     Pupils: Pupils are equal, round, and reactive to light.  Cardiovascular:     Rate and Rhythm: Normal rate and regular rhythm.  Pulmonary:     Effort: Pulmonary effort is normal.  Abdominal:     Palpations: Abdomen is soft.  Musculoskeletal:     Cervical back: Normal range of motion.  Skin:    General: Skin is warm.  Neurological:     General: No focal deficit present.     Mental Status: She is alert.  Psychiatric:        Mood and Affect: Mood normal.     Data Reviewed:  There are no new results to  review at this time.  Family Communication: 31  Disposition: Status is: Observation The patient remains OBS appropriate and will d/c before 2 midnights.  Planned Discharge Destination: Home    Time spent: 32 minutes  Author: Kirstie Peri, MD 09/16/2022 1:15 PM  For on call review [email or web address].

## 2022-09-16 NOTE — Plan of Care (Signed)

## 2022-09-16 NOTE — Plan of Care (Signed)

## 2022-09-16 NOTE — Progress Notes (Addendum)
Patient is alert and oriented X 4. Around 1800. Pt was crying and anxious. She told us not had a good sleep last night.she also complained indigestion. MD made aware. I offered medication as order but she refused to take any medicine right now. We will monitor her continuously.

## 2022-09-17 DIAGNOSIS — E871 Hypo-osmolality and hyponatremia: Secondary | ICD-10-CM | POA: Diagnosis not present

## 2022-09-17 LAB — COMPREHENSIVE METABOLIC PANEL
ALT: 13 U/L (ref 0–44)
AST: 19 U/L (ref 15–41)
Albumin: 3.9 g/dL (ref 3.5–5.0)
Alkaline Phosphatase: 43 U/L (ref 38–126)
Anion gap: 9 (ref 5–15)
BUN: 19 mg/dL (ref 8–23)
CO2: 20 mmol/L — ABNORMAL LOW (ref 22–32)
Calcium: 9.3 mg/dL (ref 8.9–10.3)
Chloride: 99 mmol/L (ref 98–111)
Creatinine, Ser: 0.74 mg/dL (ref 0.44–1.00)
GFR, Estimated: >60 mL/min (ref >60)
Glucose, Bld: 104 mg/dL — ABNORMAL HIGH (ref 70–99)
Potassium: 4.2 mmol/L (ref 3.5–5.1)
Sodium: 128 mmol/L — ABNORMAL LOW (ref 135–145)
Total Bilirubin: 0.3 mg/dL (ref 0.3–1.2)
Total Protein: 6.8 g/dL (ref 6.5–8.1)

## 2022-09-17 LAB — CBC
HCT: 41.8 % (ref 36.0–46.0)
Hemoglobin: 14.3 g/dL (ref 12.0–15.0)
MCH: 31.5 pg (ref 26.0–34.0)
MCHC: 34.2 g/dL (ref 30.0–36.0)
MCV: 92.1 fL (ref 80.0–100.0)
Platelets: 298 10*3/uL (ref 150–400)
RBC: 4.54 MIL/uL (ref 3.87–5.11)
RDW: 12.2 % (ref 11.5–15.5)
WBC: 7.3 10*3/uL (ref 4.0–10.5)
nRBC: 0 % (ref 0.0–0.2)

## 2022-09-17 NOTE — Plan of Care (Signed)
Patient is alert and oriented X 4. Discharge instruction given. No any questions at this time. Problem: Education: Goal: Knowledge of General Education information will improve Description: Including pain rating scale, medication(s)/side effects and non-pharmacologic comfort measures Outcome: Completed/Met   Problem: Health Behavior/Discharge Planning: Goal: Ability to manage health-related needs will improve Outcome: Completed/Met   Problem: Clinical Measurements: Goal: Ability to maintain clinical measurements within normal limits will improve Outcome: Completed/Met Goal: Will remain free from infection Outcome: Completed/Met Goal: Diagnostic test results will improve Outcome: Completed/Met Goal: Respiratory complications will improve Outcome: Completed/Met Goal: Cardiovascular complication will be avoided Outcome: Completed/Met   Problem: Activity: Goal: Risk for activity intolerance will decrease Outcome: Completed/Met   Problem: Nutrition: Goal: Adequate nutrition will be maintained Outcome: Completed/Met   Problem: Coping: Goal: Level of anxiety will decrease Outcome: Completed/Met   Problem: Elimination: Goal: Will not experience complications related to bowel motility Outcome: Completed/Met Goal: Will not experience complications related to urinary retention Outcome: Completed/Met   Problem: Pain Managment: Goal: General experience of comfort will improve Outcome: Completed/Met   Problem: Safety: Goal: Ability to remain free from injury will improve Outcome: Completed/Met   Problem: Skin Integrity: Goal: Risk for impaired skin integrity will decrease Outcome: Completed/Met

## 2022-09-17 NOTE — Discharge Summary (Signed)
Physician Discharge Summary   Patient: Jill Stanley MRN: [id number] DOB: 11/01/1932  Admit date:     09/15/2022  Discharge date: 09/17/22  Discharge Physician: Kirstie Peri   PCP: [name], MD   Recommendations at discharge:   Follow up with PCP in 1 week.  Fluid restriction to 1L   Discharge Diagnoses: Principal Problem:   Hyponatremia Active Problems:   Hypertension   Prediabetes   GERD (gastroesophageal reflux disease)   Chronic hyponatremia  Resolved Problems:   * No resolved hospital problems. *  Hospital Course:  89 y.o. female with medical history significant for chronic hyponatremia, GERD, hypertension, prediabetes, gastritis presenting with hyponatremia of 121.  Patient denies any headaches blurred vision dizziness nausea vomiting or any other symptoms today.  States that she is doing well but her PCP was extremely concerned about her sodium and asked her to come to the hospital.  She has seen Dr. Thedore Mins for her high blood pressure but not for her sodium. In the emergency room patient is alert awake oriented afebrile no distress ambulatory and cooperative.  Vitals show intermittent sinus bradycardia but elevated blood pressures.  Initial BMP shows sodium of 126 potassium 5.2 glucose 107 normal kidney function hepatic panel pending.  Serum osmolality of 273, normal CBC, urine osmolality of 319 and urine sodium of 92.  Patient is clinically euvolemic.  Etiology of hyponatremia most likely SIADH.   patient underwent fluid restriction with 24-hour with improvement in sodium levels.  Serum sodium level 1 29-1 28.  On the day of discharge patient was euvolemic mildly hypertensive with improvement in serum sodium to 128.  Advised patient to restrict fluid intake regular salt intake.  Follow with PCP in 1 week after discharge.  Patient on day of discharge was hemodynamically stable labs with improvement in serum sodium of 128.  Plan of discharge was discussed with the patient  patient excited about going home.  Assessment and Plan:  Hyponatremia Patient has history of chronic hyponatremia since as far as 2014 per our chart.  Her levels have always been over 120s.  Medications have been changed in the past and hyponatremia has been attributed to diuretic therapy as well. Today patient is clinically euvolemic, serum osmolality is 273, urine osmolality is 319 and urine sodium is 92 concerning for SIADH causing the hyponatremia.  We will do fluid restriction trial of 800 ml helped with improvement in serum sodium to 1 29-1 28 on the day of discharge.  Hypertension Vitals:   09/15/22 1547 09/15/22 1853  BP: (!) 186/76 (!) 169/77   Vitals:   09/15/22 1547 09/15/22 1853  BP: (!) 186/76 (!) 169/77  Pulse: 69 (!) 59  Temp: 98.7 F (37.1 C) 98.3 F (36.8 C)  Resp: 17 16  SpO2: 99% 99%  TempSrc: Oral Oral  Patient's heart rate is limiting as to increasing her Coreg dose. Will continue patient on current dose of Coreg at 3.125, clonidine patch 0.1 mg, lisinopril 40 mg daily, bystolic low dose trial tonight as it does not cause bradycardia typically and is effective for BP control . Or we may can try hydralazine  GERD (gastroesophageal reflux disease) Continue patient on IV PPI therapy.  Prediabetes Will obtain an A1c. Carb consistent diet.     Pain control - [employer] Controlled Substance Reporting System database was reviewed. and patient was instructed, not to drive, operate heavy machinery, perform activities at heights, swimming or participation in water activities or provide baby-sitting services while on Pain,  Sleep and Anxiety Medications; until their outpatient Physician has advised to do so again. Also recommended to not to take more than prescribed Pain, Sleep and Anxiety Medications.  Consultants: None  Procedures performed: None  Disposition: Home Diet recommendation:  Discharge Diet Orders (From admission, onward)     Start     Ordered    09/17/22 0000  Diet - low sodium heart healthy        09/17/22 1218           Regular diet with fluid restriction to 1L  DISCHARGE MEDICATION: Allergies as of 09/17/2022       Reactions   Amlodipine Swelling   Hydralazine    Rash   Hydralazine Hcl    Rash   Norvasc [amlodipine Besylate]    5 mg leg swelling    Procardia Xl [nifedipine Er]    Leg swelling/ankle   Epinephrine Anxiety   Hyperactivity   Ibuprofen Other (See Comments)   Cannot take due to GI ulcer   Lovastatin Diarrhea, Nausea Only, Nausea And Vomiting   Morphine Nausea Only   Tramadol Nausea Only   Caused nausea, "felt awful all over", and med did not help with pain "Felt awful all over", and med did not help with pain   Zocor [simvastatin] Diarrhea, Nausea And Vomiting, Nausea Only   Nausea and vomiting         Medication List     TAKE these medications    acetaminophen 650 MG CR tablet Commonly known as: TYLENOL Take 1,300 mg by mouth every 8 (eight) hours as needed for pain.   Calcium Carbonate-Vitamin D3 600-400 MG-UNIT Tabs Take 2 tablets by mouth daily.   calcium elemental as carbonate 400 MG chewable tablet Commonly known as: BARIATRIC TUMS ULTRA Chew 1,000 mg by mouth as needed for heartburn.   carvedilol 3.125 MG tablet Commonly known as: COREG Take 1 tablet (3.125 mg total) by mouth 2 (two) times daily with a meal.   cloNIDine 0.1 MG tablet Commonly known as: CATAPRES Take 1 tablet (0.1 mg total) by mouth in the morning and at bedtime.   cyanocobalamin 250 MCG tablet Commonly known as: VITAMIN B12 Take 250 mcg by mouth daily.   esomeprazole 20 MG capsule Commonly known as: NEXIUM TAKE 1 CAPSULE DAILY AT 12 NOON   ezetimibe 10 MG tablet Commonly known as: ZETIA Take 1 tablet (10 mg total) by mouth daily.   lisinopril 40 MG tablet Commonly known as: ZESTRIL Take 1 tablet (40 mg total) by mouth daily.   Restasis 0.05 % ophthalmic emulsion Generic drug:  cycloSPORINE Place 1 drop into both eyes 2 (two) times daily.   sodium chloride 0.65 % Soln nasal spray Commonly known as: OCEAN Place 2 sprays into both nostrils as needed for congestion.   solifenacin 5 MG tablet Commonly known as: VESICARE TAKE 1 TABLET EVERY DAY   spironolactone 25 MG tablet Commonly known as: ALDACTONE Take 1 tablet (25 mg total) by mouth daily. In am See change in sig 1x per day   VITAMIN D PO Take 2,000 Units by mouth daily.        Discharge Exam: Filed Weights   09/17/22 0500  Weight: 73.9 kg   Physical Exam Constitutional:      Appearance: Normal appearance.  HENT:     Head: Normocephalic and atraumatic.     Mouth/Throat:     Mouth: Mucous membranes are moist.  Eyes:     Extraocular Movements: Extraocular movements intact.  Pupils: Pupils are equal, round, and reactive to light.  Cardiovascular:     Rate and Rhythm: Normal rate and regular rhythm.     Pulses: Normal pulses.     Heart sounds: Normal heart sounds.  Pulmonary:     Effort: Pulmonary effort is normal.  Abdominal:     Palpations: Abdomen is soft.  Musculoskeletal:        General: Normal range of motion.     Cervical back: Normal range of motion.  Neurological:     General: No focal deficit present.     Mental Status: She is alert and oriented to person, place, and time. Mental status is at baseline.  Psychiatric:        Mood and Affect: Mood normal.        Behavior: Behavior normal.      Condition at discharge: good  The results of significant diagnostics from this hospitalization (including imaging, microbiology, ancillary and laboratory) are listed below for reference.   Imaging Studies: No results found.  Microbiology: Results for orders placed or performed in visit on 03/07/22  Respiratory virus panel     Status: Abnormal   Collection Time: 03/07/22 11:11 AM   Specimen: Respiratory  Result Value Ref Range Status   Adenovirus B Not Detected Not Detected  Final   Rhinovirus Not Detected Not Detected Final   Influenza A Not Detected Not Detected Final   INFLUENZA A SUBTYPE H1 Not Detected Not Detected Final   INFLUENZA A SUBTYPE H3 Not Detected Not Detected Final   Influenza B Not Detected Not Detected Final   Metapneumovirus Not Detected Not Detected Final   Respiratory Syncytial Virus A Not Detected Not Detected Final   Respiratory Syncytial Virus B Detected (A) Not Detected Final   HUMAN PARAINFLU VIRUS 1 Not Detected Not Detected Final   HUMAN PARAINFLU VIRUS 2 Not Detected Not Detected Final   HUMAN PARAINFLU VIRUS 3 Not Detected Not Detected Final   Comment see note  Final    Comment: THIS ASSAY WILL NOT DETECT SARS-CoV-2 (COVID-19) . This test is performed using the [employer]. . . Limitations: A negative result does not rule out respiratory pathogen infection below the sensitivity limit of the assay. The sensitivity depends on pathogen and sample type. This assay cannot reliably distinguish between Rhinovirus and Enterovirus due to genetic similarities between the viruses. .     Labs: CBC: Recent Labs  Lab 09/14/22 1529 09/15/22 1549 09/15/22 2309 09/16/22 0416 09/17/22 0449  WBC 6.7 7.4 9.1 6.9 7.3  HGB 13.2 13.3 14.4 13.1 14.3  HCT 40.2 39.5 43.8 38.0 41.8  MCV 94.3 92.5 94.0 90.0 92.1  PLT 270.0 291 302 267 298   Basic Metabolic Panel: Recent Labs  Lab 09/14/22 1529 09/15/22 1549 09/15/22 2309 09/16/22 0416 09/17/22 0449  NA 121* 126*  --  129* 128*  K 5.0 5.2*  --  4.2 4.2  CL 89* 95*  --  99 99  CO2 24 21*  --  22 20*  GLUCOSE 94 107*  --  87 104*  BUN 20 23  --  16 19  CREATININE 0.85 0.86 0.82 0.72 0.74  CALCIUM 10.1 9.4  --  9.2 9.3   Liver Function Tests: Recent Labs  Lab 09/14/22 1529 09/16/22 0416 09/17/22 0449  AST 17 19 19   ALT 10 12 13   ALKPHOS 48 37* 43  BILITOT 0.5 0.4 0.3  PROT 6.4 6.4* 6.8  ALBUMIN 4.3 3.8 3.9  CBG: No results for input(s): "GLUCAP" in  the last 168 hours.  Discharge time spent: greater than 30 minutes.  Signed: Kirstie Peri, MD [facility] 09/17/2022

## 2022-10-19 ENCOUNTER — Telehealth: Payer: Self-pay | Admitting: Family Medicine

## 2022-10-19 DIAGNOSIS — Z96652 Presence of left artificial knee joint: Secondary | ICD-10-CM | POA: Diagnosis not present

## 2022-10-19 DIAGNOSIS — I700 Atherosclerosis of aorta: Secondary | ICD-10-CM | POA: Diagnosis not present

## 2022-10-19 NOTE — Telephone Encounter (Signed)
Called pt and she stated that she went to the hospital for her sodium being low. She stated that they kept her for 2 day to bring back up her level, and  she wanted to check and make sure that her sodium level is staying up.

## 2022-10-19 NOTE — Telephone Encounter (Signed)
Pt called stating she would like to get labs done because to check your sodium and electrolytes

## 2022-10-20 NOTE — Telephone Encounter (Signed)
Hospital follow up made

## 2022-10-24 ENCOUNTER — Ambulatory Visit (INDEPENDENT_AMBULATORY_CARE_PROVIDER_SITE_OTHER): Payer: Medicare PPO | Admitting: Family Medicine

## 2022-10-24 ENCOUNTER — Encounter: Payer: Self-pay | Admitting: Family Medicine

## 2022-10-24 VITALS — BP 132/64 | HR 59 | Temp 98.0°F | Resp 16 | Ht 59.0 in | Wt 163.0 lb

## 2022-10-24 DIAGNOSIS — I10 Essential (primary) hypertension: Secondary | ICD-10-CM | POA: Diagnosis not present

## 2022-10-24 DIAGNOSIS — E871 Hypo-osmolality and hyponatremia: Secondary | ICD-10-CM

## 2022-10-24 LAB — COMPREHENSIVE METABOLIC PANEL
ALT: 9 U/L (ref 0–35)
AST: 16 U/L (ref 0–37)
Albumin: 4 g/dL (ref 3.5–5.2)
Alkaline Phosphatase: 55 U/L (ref 39–117)
BUN: 31 mg/dL — ABNORMAL HIGH (ref 6–23)
CO2: 26 mEq/L (ref 19–32)
Calcium: 10 mg/dL (ref 8.4–10.5)
Chloride: 99 mEq/L (ref 96–112)
Creatinine, Ser: 0.93 mg/dL (ref 0.40–1.20)
GFR: 54.91 mL/min — ABNORMAL LOW (ref >60.00)
Glucose, Bld: 87 mg/dL (ref 70–99)
Potassium: 5.1 mEq/L (ref 3.5–5.1)
Sodium: 131 mEq/L — ABNORMAL LOW (ref 135–145)
Total Bilirubin: 0.4 mg/dL (ref 0.2–1.2)
Total Protein: 6.7 g/dL (ref 6.0–8.3)

## 2022-10-24 NOTE — Patient Instructions (Signed)
It was a pleasure meeting you today. Thank you for allowing me to take part in your health care.  Our goals for today as we discussed include:  Check labs today Continue to limit fluid intake to <1000 cc daily  Lower Tylenol to 650 mg three times a day   Follow up in 3 months or sooner    If you have any questions or concerns, please do not hesitate to call the office at [phone].  I look forward to our next visit and until then take care and stay safe.  Regards,   [name], MD   [facility]

## 2022-10-24 NOTE — Progress Notes (Signed)
SUBJECTIVE:   Chief Complaint  Patient presents with   Hospitalization Follow-up   HPI Presents to clinic for hospital follow up   Admitted to [facility] 08/16-0818 for hyponatremia of 121. ED couse showed labs significant for Na 126, K 5.2, Glu 107.  KFT/LFT normal.  Serum osmolality 273, urine osmolality 319 and urine sodium 92. Blood pressure was elevated 174/63.  She was treated for SIADH with fluid restriction x 24 hrs and Na levels improved.  Discharged home with Na level of 128. Reports complaint with fluid restriction of <1000 cc daily but is challenging.  Has had history of low sodium in past.  Denies any weakness, mental changes, nausea/vomiting, chest pain, shortness of breath or lower extremity edema.    HTN Asymptomatic.  Compliant with medications Following with HTN clinic in [place] but difficult to drive there and would like to be seen at [facility] clinic when possible. Has been waiting for scheduled appointment but nothing available yet.    PERTINENT PMH / PSH: As above  OBJECTIVE:  BP 132/64   Pulse (!) 59   Temp 98 F (36.7 C)   Resp 16   Ht 4\' 11"  (1.499 m)   Wt 163 lb (73.9 kg)   SpO2 96%   BMI 32.92 kg/m    Physical Exam Vitals reviewed.  Constitutional:      General: She is not in acute distress.    Appearance: Normal appearance. She is normal weight. She is not ill-appearing, toxic-appearing or diaphoretic.  Eyes:     General:        Right eye: No discharge.        Left eye: No discharge.     Conjunctiva/sclera: Conjunctivae normal.  Cardiovascular:     Rate and Rhythm: Normal rate and regular rhythm.     Heart sounds: Normal heart sounds.  Pulmonary:     Effort: Pulmonary effort is normal.     Breath sounds: Normal breath sounds.  Abdominal:     General: Bowel sounds are normal.  Musculoskeletal:        General: Normal range of motion.  Skin:    General: Skin is warm and dry.  Neurological:     General: No focal deficit present.      Mental Status: She is alert and oriented to person, place, and time. Mental status is at baseline.  Psychiatric:        Mood and Affect: Mood normal.        Behavior: Behavior normal.        Thought Content: Thought content normal.        Judgment: Judgment normal.        09/14/2022    2:46 PM 07/10/2022   10:33 AM 03/07/2022   10:28 AM 01/17/2022    9:49 AM 11/15/2021    2:07 PM  Depression screen PHQ 2/9  Decreased Interest 0 0 0 0 0  Down, Depressed, Hopeless 0 0 0 0 0  PHQ - 2 Score 0 0 0 0 0  Altered sleeping 0 0 0 0   Tired, decreased energy 0 0 0 0   Change in appetite 0 0 0 0   Feeling bad or failure about yourself  0 0 0 0   Trouble concentrating 0 0 0 0   Moving slowly or fidgety/restless 0 0 0 0   Suicidal thoughts 0 0 0 0   PHQ-9 Score 0 0 0 0   Difficult doing work/chores Not difficult at all  Not difficult at all Not difficult at all Not difficult at all       09/14/2022    2:46 PM 03/07/2022   10:28 AM 01/17/2022    9:50 AM 03/28/2019    9:42 AM  GAD 7 : Generalized Anxiety Score  Nervous, Anxious, on Edge 0 0 0 0  Control/stop worrying 0 0 0 0  Worry too much - different things 0 0 0 0  Trouble relaxing 0 0 0 0  Restless 0 0 0 0  Easily annoyed or irritable 0 0 0 0  Afraid - awful might happen 0 0 0 0  Total GAD 7 Score 0 0 0 0  Anxiety Difficulty Not difficult at all Not difficult at all Not difficult at all Not difficult at all    ASSESSMENT/PLAN:  Hyponatremia Assessment & Plan: History of chronic hyponatremia Recently admitted for low Na 121 and treated with fluid restriction for SIADH Asymptomatic today and euvolemic Check sodium today Continue fluid restriction Consider Nephrology consult Decrease Acetaminophen to 650 mg  BID, may contribute to hyponatremia  Orders: -     Comprehensive metabolic panel  Primary hypertension Assessment & Plan: Chronic.  Stable.  Well-controlled today.   Continue current medications. Will continue to manage  BP as patient unable to continue with HTN clinic.  Would like to have patient seen at [facility] clinic in future if possible. Check Cmet today Continue to monitor BP at home, goal <140/90       PDMP reviewed  Return in about 3 months (around 01/23/2023) for PCP.  [name], MD

## 2022-10-25 ENCOUNTER — Other Ambulatory Visit: Payer: Self-pay | Admitting: Family Medicine

## 2022-10-25 DIAGNOSIS — E871 Hypo-osmolality and hyponatremia: Secondary | ICD-10-CM

## 2022-10-26 ENCOUNTER — Other Ambulatory Visit: Payer: Self-pay | Admitting: Family Medicine

## 2022-10-26 DIAGNOSIS — I10 Essential (primary) hypertension: Secondary | ICD-10-CM

## 2022-11-01 ENCOUNTER — Encounter: Payer: Self-pay | Admitting: Family Medicine

## 2022-11-01 NOTE — Assessment & Plan Note (Signed)
>>  ASSESSMENT AND PLAN FOR HYPONATREMIA WRITTEN ON 11/01/2022  1:01 PM BY WALSH, TANYA, MD  History of chronic hyponatremia Recently admitted for low Na 121 and treated with fluid restriction for SIADH Asymptomatic today and euvolemic Check sodium today Continue fluid restriction Consider Nephrology consult Decrease Acetaminophen  to 650 mg  BID, may contribute to hyponatremia

## 2022-11-01 NOTE — Assessment & Plan Note (Signed)
Chronic.  Stable.  Well-controlled today.   Continue current medications. Will continue to manage BP as patient unable to continue with HTN clinic.  Would like to have patient seen at [facility] clinic in future if possible. Check Cmet today Continue to monitor BP at home, goal <140/90

## 2022-11-01 NOTE — Assessment & Plan Note (Signed)
History of chronic hyponatremia Recently admitted for low Na 121 and treated with fluid restriction for SIADH Asymptomatic today and euvolemic Check sodium today Continue fluid restriction Consider Nephrology consult Decrease Acetaminophen to 650 mg  BID, may contribute to hyponatremia

## 2022-11-09 ENCOUNTER — Other Ambulatory Visit (INDEPENDENT_AMBULATORY_CARE_PROVIDER_SITE_OTHER): Payer: Medicare PPO

## 2022-11-09 DIAGNOSIS — E871 Hypo-osmolality and hyponatremia: Secondary | ICD-10-CM

## 2022-11-09 LAB — BASIC METABOLIC PANEL
BUN: 23 mg/dL (ref 6–23)
CO2: 26 meq/L (ref 19–32)
Calcium: 10 mg/dL (ref 8.4–10.5)
Chloride: 98 meq/L (ref 96–112)
Creatinine, Ser: 0.96 mg/dL (ref 0.40–1.20)
GFR: 52.85 mL/min — ABNORMAL LOW (ref >60.00)
Glucose, Bld: 98 mg/dL (ref 70–99)
Potassium: 4.5 meq/L (ref 3.5–5.1)
Sodium: 132 meq/L — ABNORMAL LOW (ref 135–145)

## 2022-12-18 DIAGNOSIS — I10 Essential (primary) hypertension: Secondary | ICD-10-CM | POA: Diagnosis not present

## 2022-12-18 DIAGNOSIS — E871 Hypo-osmolality and hyponatremia: Secondary | ICD-10-CM | POA: Diagnosis not present

## 2023-01-26 ENCOUNTER — Ambulatory Visit: Payer: Medicare PPO | Admitting: Family Medicine

## 2023-01-26 ENCOUNTER — Telehealth: Payer: Self-pay | Admitting: Family Medicine

## 2023-01-26 NOTE — Telephone Encounter (Signed)
Left message to call and reschedule 01/26/23 appt. Provider is out. Schedule next available appointment.

## 2023-02-20 ENCOUNTER — Ambulatory Visit (INDEPENDENT_AMBULATORY_CARE_PROVIDER_SITE_OTHER): Payer: Medicare PPO | Admitting: Family Medicine

## 2023-02-20 ENCOUNTER — Encounter: Payer: Self-pay | Admitting: Family Medicine

## 2023-02-20 VITALS — BP 126/66 | HR 64 | Temp 98.0°F | Resp 18 | Ht 59.0 in | Wt 166.0 lb

## 2023-02-20 DIAGNOSIS — E875 Hyperkalemia: Secondary | ICD-10-CM | POA: Diagnosis not present

## 2023-02-20 DIAGNOSIS — Q809 Congenital ichthyosis, unspecified: Secondary | ICD-10-CM

## 2023-02-20 DIAGNOSIS — E871 Hypo-osmolality and hyponatremia: Secondary | ICD-10-CM

## 2023-02-20 DIAGNOSIS — I10 Essential (primary) hypertension: Secondary | ICD-10-CM

## 2023-02-20 LAB — COMPREHENSIVE METABOLIC PANEL
ALT: 9 U/L (ref 0–35)
AST: 18 U/L (ref 0–37)
Albumin: 4 g/dL (ref 3.5–5.2)
Alkaline Phosphatase: 66 U/L (ref 39–117)
BUN: 21 mg/dL (ref 6–23)
CO2: 26 meq/L (ref 19–32)
Calcium: 9.5 mg/dL (ref 8.4–10.5)
Chloride: 98 meq/L (ref 96–112)
Creatinine, Ser: 0.85 mg/dL (ref 0.40–1.20)
GFR: 61.03 mL/min (ref >60.00)
Glucose, Bld: 87 mg/dL (ref 70–99)
Potassium: 4.5 meq/L (ref 3.5–5.1)
Sodium: 131 meq/L — ABNORMAL LOW (ref 135–145)
Total Bilirubin: 0.4 mg/dL (ref 0.2–1.2)
Total Protein: 6.6 g/dL (ref 6.0–8.3)

## 2023-02-20 NOTE — Progress Notes (Signed)
SUBJECTIVE:   Chief Complaint  Patient presents with   Medical Management of Chronic Issues   HPI Presents for follow up chronic disease management  Discussed the use of AI scribe software for clinical note transcription with the patient, who gave verbal consent to proceed.  History of Present Illness The patient, with a history of hypertension and renal disease, presents for a routine follow-up. They report feeling well with no new health concerns. They have been adhering to a fluid restriction regimen as advised by their nephrologist, Dr. Thedore Mins, and have not experienced any swelling, shortness of breath, or chest pains. They have been maintaining their blood pressure with five different medications and report that their blood pressure readings at home are consistent with those taken in the clinic.  The patient also reports a recent episode of transient dizziness when turning over onto their left side in bed. This has occurred before and was evaluated by an ENT specialist, Dr. Willeen Cass, who found no signs of inner ear pathology. The dizziness is not persistent and does not occur during the day.  The patient's blood work shows a borderline high potassium level of 5.3, which led to a recommendation from Dr. Thedore Mins to reduce intake of high-potassium foods. The patient's sodium level is consistently low at 133, which is considered normal for them.  The patient also reports an intermittent itch on their neck, with no visible rash or other skin changes. They suspect it may be related to the cord of their emergency alert device, which they wear constantly. Despite washing the cord and applying cortisone cream, the itch persists.  The patient is not currently under the care of a cardiologist and has not been scheduled for a cardiology appointment. They are satisfied with the current management of their blood pressure and have no other health concerns at this time.      PERTINENT PMH / PSH: As  above  OBJECTIVE:  BP 126/66   Pulse 64   Temp 98 F (36.7 C)   Resp 18   Ht 4\' 11"  (1.499 m)   Wt 166 lb (75.3 kg)   SpO2 97%   BMI 33.53 kg/m    Physical Exam Vitals reviewed.  Constitutional:      General: She is not in acute distress.    Appearance: Normal appearance. She is normal weight. She is not ill-appearing, toxic-appearing or diaphoretic.  Eyes:     General:        Right eye: No discharge.        Left eye: No discharge.     Conjunctiva/sclera: Conjunctivae normal.  Neck:     Thyroid: No thyromegaly or thyroid tenderness.  Cardiovascular:     Rate and Rhythm: Normal rate and regular rhythm.     Heart sounds: Normal heart sounds.  Pulmonary:     Effort: Pulmonary effort is normal.     Breath sounds: Normal breath sounds.  Abdominal:     General: Bowel sounds are normal.  Musculoskeletal:        General: Normal range of motion.  Skin:    General: Skin is warm and dry.  Neurological:     General: No focal deficit present.     Mental Status: She is alert and oriented to person, place, and time. Mental status is at baseline.  Psychiatric:        Mood and Affect: Mood normal.        Behavior: Behavior normal.  Thought Content: Thought content normal.        Judgment: Judgment normal.           02/20/2023    8:33 AM 09/14/2022    2:46 PM 07/10/2022   10:33 AM 03/07/2022   10:28 AM 01/17/2022    9:49 AM  Depression screen PHQ 2/9  Decreased Interest 0 0 0 0 0  Down, Depressed, Hopeless 0 0 0 0 0  PHQ - 2 Score 0 0 0 0 0  Altered sleeping 0 0 0 0 0  Tired, decreased energy 0 0 0 0 0  Change in appetite 0 0 0 0 0  Feeling bad or failure about yourself  0 0 0 0 0  Trouble concentrating 0 0 0 0 0  Moving slowly or fidgety/restless 0 0 0 0 0  Suicidal thoughts 0 0 0 0 0  PHQ-9 Score 0 0 0 0 0  Difficult doing work/chores Not difficult at all Not difficult at all Not difficult at all Not difficult at all Not difficult at all      02/20/2023     8:33 AM 09/14/2022    2:46 PM 03/07/2022   10:28 AM 01/17/2022    9:50 AM  GAD 7 : Generalized Anxiety Score  Nervous, Anxious, on Edge 0 0 0 0  Control/stop worrying 0 0 0 0  Worry too much - different things 0 0 0 0  Trouble relaxing 0 0 0 0  Restless 0 0 0 0  Easily annoyed or irritable 0 0 0 0  Afraid - awful might happen 0 0 0 0  Total GAD 7 Score 0 0 0 0  Anxiety Difficulty Not difficult at all Not difficult at all Not difficult at all Not difficult at all    ASSESSMENT/PLAN:  Primary hypertension Assessment & Plan: Well controlled on current regimen. Blood pressure at today's visit was 126/66. -Continue current antihypertensive medications. -Monitor blood pressure at home.  Orders: -     Comprehensive metabolic panel  Hyponatremia -     Comprehensive metabolic panel  Chronic hyponatremia Assessment & Plan: Chronic, stable. Patient is adhering to fluid restriction of per day. -Continue fluid restriction.   Hyperkalemia Assessment & Plan: Borderline high at 5.3. Patient has been advised to limit high potassium foods. -Continue dietary modifications.   Xeroderma Assessment & Plan: Patient reports intermittent itching on the neck. No visible rash or other skin changes. -Recommend trial of Aveeno moisturizer for potential dry skin.     PDMP reviewed  Return in about 6 months (around 08/20/2023) for PCP, HTN.  [name], MD

## 2023-02-20 NOTE — Patient Instructions (Addendum)
It was a pleasure meeting you today. Thank you for allowing me to take part in your health care.  Our goals for today as we discussed include:  We will get some labs today.  If they are abnormal or we need to do something about them, I will call you.  If they are normal, I will send you a message on MyChart (if it is active) or a letter in the mail.  If you don't hear from [place] in 2 weeks, please call the office at the number below.   Continue current medications Blood pressure is great   This is a list of the screening recommended for you and due dates:  Health Maintenance  Topic Date Due   COVID-19 Vaccine (7 - 2024-25 season) 12/14/2022   Medicare Annual Wellness Visit  07/10/2023   DTaP/Tdap/Td vaccine (3 - Td or Tdap) 04/21/2031   Pneumonia Vaccine  Completed   Flu Shot  Completed   DEXA scan (bone density measurement)  Completed   Zoster (Shingles) Vaccine  Completed   HPV Vaccine  Aged Out    Follow up in 6 months or sooner if needed  If you have any questions or concerns, please do not hesitate to call the office at [phone].  I look forward to our next visit and until then take care and stay safe.  Regards,   [name], MD   [facility]

## 2023-02-21 ENCOUNTER — Encounter: Payer: Self-pay | Admitting: Family Medicine

## 2023-03-03 ENCOUNTER — Encounter: Payer: Self-pay | Admitting: Family Medicine

## 2023-03-03 DIAGNOSIS — Q809 Congenital ichthyosis, unspecified: Secondary | ICD-10-CM | POA: Insufficient documentation

## 2023-03-03 DIAGNOSIS — E875 Hyperkalemia: Secondary | ICD-10-CM | POA: Insufficient documentation

## 2023-03-03 NOTE — Assessment & Plan Note (Signed)
Chronic, stable. Patient is adhering to fluid restriction of per day. -Continue fluid restriction.

## 2023-03-03 NOTE — Assessment & Plan Note (Signed)
Well controlled on current regimen. Blood pressure at today's visit was 126/66. -Continue current antihypertensive medications. -Monitor blood pressure at home.

## 2023-03-03 NOTE — Assessment & Plan Note (Signed)
Borderline high at 5.3. Patient has been advised to limit high potassium foods. -Continue dietary modifications.

## 2023-03-03 NOTE — Assessment & Plan Note (Signed)
Patient reports intermittent itching on the neck. No visible rash or other skin changes. -Recommend trial of Aveeno moisturizer for potential dry skin.

## 2023-03-19 ENCOUNTER — Ambulatory Visit: Payer: Medicare PPO | Admitting: Family Medicine

## 2023-03-21 ENCOUNTER — Other Ambulatory Visit: Payer: Self-pay | Admitting: Family Medicine

## 2023-03-22 ENCOUNTER — Encounter: Payer: Self-pay | Admitting: Family Medicine

## 2023-04-04 ENCOUNTER — Ambulatory Visit: Admitting: Family

## 2023-04-04 ENCOUNTER — Encounter: Payer: Self-pay | Admitting: Family

## 2023-04-04 VITALS — BP 136/72 | HR 68 | Temp 98.9°F | Ht 59.0 in | Wt 166.6 lb

## 2023-04-04 DIAGNOSIS — K219 Gastro-esophageal reflux disease without esophagitis: Secondary | ICD-10-CM

## 2023-04-04 DIAGNOSIS — J069 Acute upper respiratory infection, unspecified: Secondary | ICD-10-CM | POA: Diagnosis not present

## 2023-04-04 NOTE — Assessment & Plan Note (Signed)
 Chronic, suboptimal control while she has been sick. Advised patient to increase Nexium temporarily to 20 mg twice daily to see if symptoms abate.  After 1 week she may reduce Nexium dose by 20 mg once daily.

## 2023-04-04 NOTE — Patient Instructions (Addendum)
 Increase nexium to 20 mg in the morning and 20mg  in the evening to help with reflux.  After a few days, you may return to normal dose of Nexium of 20mg  once daily.   You may start plain claritin over the counter for post nasal drip.  Do not buy Claritin-D as this is a decongestant can raise blood pressure.   Please let me know if symptoms do not resolve.   Nice to meet you!

## 2023-04-04 NOTE — Progress Notes (Signed)
 Assessment & Plan:  Viral URI Assessment & Plan: Duration 5 days, significantly improved. We discussed and agreed more likely viral URI. Conservative management appropriate.  Advised to stop guaifenesin as ineffective will start Claritin over-the-counter for postnasal drip.  She will let me know how she is doing   Gastroesophageal reflux disease, unspecified whether esophagitis present Assessment & Plan: Chronic, suboptimal control while she has been sick. Advised patient to increase Nexium temporarily to 20 mg twice daily to see if symptoms abate.  After 1 week she may reduce Nexium dose by 20 mg once daily.      Return precautions given.   Risks, benefits, and alternatives of the medications and treatment plan prescribed today were discussed, and patient expressed understanding.   Education regarding symptom management and diagnosis given to patient on AVS either electronically or printed.  No follow-ups on file.  Rennie Plowman, FNP  Subjective:    Patient ID: Jill Stanley, female    DOB: 1933-01-04, 89 y.o.   MRN: [id number]  CC: Jill Stanley is a 89 y.o. female who presents today for an acute visit.    HPI: Complains of dry cough, bilateral sore throat x 5 days, improved.  She is feeling much better than over the weekend.   Cough started to improve 3 days ago. Cough is less persistent. Some nasal congestion, PND  No fever, chills, sob, CP, ear pain, sinus pain.   She is taking guaifenesin without relief.  She has been taking tylenol for joint pain which she states is not unusual for her.   Covid test negative 5 days ago.   Complains of decreased appetite and nauseous after eating.  She will take '3 bites of food' and she will feel epigastric burning, nausea without vomiting which is worse in the evening.  She is taking nexium 20mg  at 8am.        H/o Hypertension ,aortic atherosclerosis No history of smoking No h/o ckd  Allergies: Amlodipine,  Hydralazine, Hydralazine hcl, Norvasc [amlodipine besylate], Procardia xl [nifedipine er], Epinephrine, Ibuprofen, Lovastatin, Morphine, Tramadol, and Zocor [simvastatin] Current Outpatient Medications on File Prior to Visit  Medication Sig Dispense Refill   acetaminophen (TYLENOL) 650 MG CR tablet Take 1,300 mg by mouth every 8 (eight) hours as needed for pain.     Calcium Carbonate-Vitamin D3 600-400 MG-UNIT TABS Take 2 tablets by mouth daily.      calcium elemental as carbonate (BARIATRIC TUMS ULTRA) 400 MG chewable tablet Chew 1,000 mg by mouth as needed for heartburn.     carvedilol (COREG) 3.125 MG tablet Take 1 tablet (3.125 mg total) by mouth 2 (two) times daily with a meal. 180 tablet 3   cloNIDine (CATAPRES) 0.1 MG tablet TAKE 1 TABLET (0.1 MG TOTAL) BY MOUTH IN THE MORNING AND AT BEDTIME. 180 tablet 3   cyanocobalamin (VITAMIN B12) 250 MCG tablet Take 250 mcg by mouth daily.     esomeprazole (NEXIUM) 20 MG capsule TAKE 1 CAPSULE DAILY AT 12 NOON 90 capsule 3   ezetimibe (ZETIA) 10 MG tablet Take 1 tablet (10 mg total) by mouth daily. 90 tablet 3   lisinopril (ZESTRIL) 40 MG tablet TAKE 1 TABLET EVERY DAY 90 tablet 3   RESTASIS 0.05 % ophthalmic emulsion Place 1 drop into both eyes 2 (two) times daily.     sodium chloride (OCEAN) 0.65 % SOLN nasal spray Place 2 sprays into both nostrils as needed for congestion.     solifenacin (VESICARE) 5 MG tablet  TAKE 1 TABLET EVERY DAY 90 tablet 3   spironolactone (ALDACTONE) 25 MG tablet Take 1 tablet (25 mg total) by mouth daily. In am See change in sig 1x per day 90 tablet 3   VITAMIN D PO Take 2,000 Units by mouth daily.      No current facility-administered medications on file prior to visit.    Review of Systems  Constitutional:  Negative for chills and fever.  HENT:  Positive for congestion.   Respiratory:  Negative for cough.   Cardiovascular:  Negative for chest pain and palpitations.  Gastrointestinal:  Positive for nausea. Negative  for abdominal pain, blood in stool, constipation and vomiting.      Objective:    BP 136/72   Pulse 68   Temp 98.9 F (37.2 C) (Oral)   Ht 4\' 11"  (1.499 m)   Wt 166 lb 9.6 oz (75.6 kg)   SpO2 98%   BMI 33.65 kg/m   BP Readings from Last 3 Encounters:  04/04/23 136/72  02/20/23 126/66  10/24/22 132/64   Wt Readings from Last 3 Encounters:  04/04/23 166 lb 9.6 oz (75.6 kg)  02/20/23 166 lb (75.3 kg)  10/24/22 163 lb (73.9 kg)    Physical Exam Vitals reviewed.  Constitutional:      Appearance: She is well-developed.  HENT:     Head: Normocephalic and atraumatic.     Right Ear: Hearing, tympanic membrane, ear canal and external ear normal. No decreased hearing noted. No drainage, swelling or tenderness. No middle ear effusion. No foreign body. Tympanic membrane is not erythematous or bulging.     Left Ear: Hearing, tympanic membrane, ear canal and external ear normal. No decreased hearing noted. No drainage, swelling or tenderness.  No middle ear effusion. No foreign body. Tympanic membrane is not erythematous or bulging.     Nose: Nose normal. No rhinorrhea.     Right Sinus: No maxillary sinus tenderness or frontal sinus tenderness.     Left Sinus: No maxillary sinus tenderness or frontal sinus tenderness.     Mouth/Throat:     Pharynx: Uvula midline. Posterior oropharyngeal erythema present. No oropharyngeal exudate.     Tonsils: No tonsillar abscesses.     Comments: Bilateral erythema pharynx Eyes:     Conjunctiva/sclera: Conjunctivae normal.  Cardiovascular:     Rate and Rhythm: Regular rhythm.     Pulses: Normal pulses.     Heart sounds: Normal heart sounds.  Pulmonary:     Effort: Pulmonary effort is normal.     Breath sounds: Normal breath sounds. No wheezing, rhonchi or rales.  Lymphadenopathy:     Head:     Right side of head: No submental, submandibular, tonsillar, preauricular, posterior auricular or occipital adenopathy.     Left side of head: No  submental, submandibular, tonsillar, preauricular, posterior auricular or occipital adenopathy.     Cervical: No cervical adenopathy.  Skin:    General: Skin is warm and dry.  Neurological:     Mental Status: She is alert.  Psychiatric:        Speech: Speech normal.        Behavior: Behavior normal.        Thought Content: Thought content normal.

## 2023-04-04 NOTE — Assessment & Plan Note (Signed)
 Duration 5 days, significantly improved. We discussed and agreed more likely viral URI. Conservative management appropriate.  Advised to stop guaifenesin as ineffective will start Claritin over-the-counter for postnasal drip.  She will let me know how she is doing

## 2023-04-12 ENCOUNTER — Ambulatory Visit: Payer: Medicare PPO | Admitting: Family Medicine

## 2023-04-29 ENCOUNTER — Other Ambulatory Visit: Payer: Self-pay | Admitting: Family Medicine

## 2023-04-29 DIAGNOSIS — E785 Hyperlipidemia, unspecified: Secondary | ICD-10-CM

## 2023-04-29 DIAGNOSIS — I10 Essential (primary) hypertension: Secondary | ICD-10-CM

## 2023-05-22 ENCOUNTER — Other Ambulatory Visit: Payer: Self-pay

## 2023-05-22 ENCOUNTER — Other Ambulatory Visit: Payer: Self-pay | Admitting: Family Medicine

## 2023-05-22 NOTE — Telephone Encounter (Signed)
 Copied from CRM [id number]. Topic: Clinical - Medication Refill >> May 22, 2023  5:01 PM Shereese L wrote: Most Recent Primary Care Visit:  Provider: Calista Catching  Department: [facility]  Visit Type: ACUTE  Date: 04/04/2023  Medication: solifenacin  (VESICARE ) 5 MG tablet    Has the patient contacted their pharmacy? No (Agent: If no, request that the patient contact the pharmacy for the refill. If patient does not wish to contact the pharmacy document the reason why and proceed with request.) (Agent: If yes, when and what did the pharmacy advise?)  Is this the correct pharmacy for this prescription? Yes If no, delete pharmacy and type the correct one.  This is the patient's preferred pharmacy:  [facility] - [place], [place] - [address] [place] [address] Phone: [phone] Fax: [phone]  Has the prescription been filled recently? No  Is the patient out of the medication? No  Has the patient been seen for an appointment in the last year OR does the patient have an upcoming appointment? No  Can we respond through MyChart? Yes  Agent: Please be advised that Rx refills may take up to 3 business days. We ask that you follow-up with your pharmacy.

## 2023-06-18 DIAGNOSIS — I10 Essential (primary) hypertension: Secondary | ICD-10-CM | POA: Diagnosis not present

## 2023-06-18 DIAGNOSIS — E871 Hypo-osmolality and hyponatremia: Secondary | ICD-10-CM | POA: Diagnosis not present

## 2023-07-09 ENCOUNTER — Other Ambulatory Visit: Payer: Self-pay | Admitting: Family Medicine

## 2023-07-09 DIAGNOSIS — Z1231 Encounter for screening mammogram for malignant neoplasm of breast: Secondary | ICD-10-CM

## 2023-07-11 ENCOUNTER — Ambulatory Visit: Admitting: *Deleted

## 2023-07-11 VITALS — Ht 59.0 in | Wt 165.0 lb

## 2023-07-11 DIAGNOSIS — Z0000 Encounter for general adult medical examination without abnormal findings: Secondary | ICD-10-CM | POA: Diagnosis not present

## 2023-07-11 NOTE — Progress Notes (Signed)
 Subjective:   Jill Stanley is a 89 y.o. who presents for a Medicare Wellness preventive visit.  As a reminder, Annual Wellness Visits don't include a physical exam, and some assessments may be limited, especially if this visit is performed virtually. We may recommend an in-person follow-up visit with your provider if needed.  Visit Complete: Virtual I connected with  Waynard Hailstone on 07/11/23 by a audio enabled telemedicine application and verified that I am speaking with the correct person using two identifiers.  Patient Location: Home  Provider Location: Home Office  I discussed the limitations of evaluation and management by telemedicine. The patient expressed understanding and agreed to proceed.  Vital Signs: Because this visit was a virtual/telehealth visit, some criteria may be missing or patient reported. Any vitals not documented were not able to be obtained and vitals that have been documented are patient reported.  VideoDeclined- This patient declined Librarian, academic. Therefore the visit was completed with audio only.  Persons Participating in Visit: Patient.  AWV Questionnaire: Yes: Patient Medicare AWV questionnaire was completed by the patient on 07/10/23; I have confirmed that all information answered by patient is correct and no changes since this date.  Cardiac Risk Factors include: advanced age (>22men, >58 women);dyslipidemia;hypertension;obesity (BMI >30kg/m2)     Objective:     Today's Vitals   07/11/23 1045  Weight: 165 lb (74.8 kg)  Height: 4' 11 (1.499 m)   Body mass index is 33.33 kg/m.     07/11/2023   11:09 AM 09/15/2022   11:42 PM 09/15/2022    3:48 PM 07/10/2022   10:38 AM 04/19/2021   10:49 AM 03/22/2020    9:47 AM 03/20/2019   10:08 AM  Advanced Directives  Does Patient Have a Medical Advance Directive? Yes  No No Yes Yes Yes  Type of Estate agent of [place];Living will    Healthcare  Power of [place];Living will Healthcare Power of [place];Living will Healthcare Power of [place];Living will  Does patient want to make changes to medical advance directive?     No - Patient declined No - Patient declined No - Patient declined  Copy of Healthcare Power of Attorney in Chart? No - copy requested    No - copy requested Yes - validated most recent copy scanned in chart (See row information) Yes - validated most recent copy scanned in chart (See row information)  Would patient like information on creating a medical advance directive?  No - Patient declined  No - Patient declined       Current Medications (verified) Outpatient Encounter Medications as of 07/11/2023  Medication Sig   acetaminophen  (TYLENOL ) 650 MG CR tablet Take 1,300 mg by mouth every 8 (eight) hours as needed for pain.   Calcium  Carbonate-Vitamin D3 600-400 MG-UNIT TABS Take 2 tablets by mouth daily.    calcium  elemental as carbonate (BARIATRIC TUMS ULTRA) 400 MG chewable tablet Chew 1,000 mg by mouth as needed for heartburn.   carvedilol  (COREG ) 3.125 MG tablet TAKE 1 TABLET TWICE DAILY WITH MEALS   cloNIDine  (CATAPRES ) 0.1 MG tablet TAKE 1 TABLET (0.1 MG TOTAL) BY MOUTH IN THE MORNING AND AT BEDTIME.   cyanocobalamin  (VITAMIN B12) 250 MCG tablet Take 250 mcg by mouth daily.   esomeprazole  (NEXIUM ) 20 MG capsule TAKE 1 CAPSULE DAILY AT 12 NOON   ezetimibe  (ZETIA ) 10 MG tablet TAKE 1 TABLET EVERY DAY   lisinopril  (ZESTRIL ) 40 MG tablet TAKE 1 TABLET EVERY DAY  Polyethyl Glycol-Propyl Glycol (SYSTANE OP) Apply to eye as needed.   sodium chloride  (OCEAN) 0.65 % SOLN nasal spray Place 2 sprays into both nostrils as needed for congestion.   solifenacin  (VESICARE ) 5 MG tablet TAKE 1 TABLET EVERY DAY   spironolactone  (ALDACTONE ) 25 MG tablet Take 1 tablet (25 mg total) by mouth daily. In am See change in sig 1x per day   VITAMIN D  PO Take 2,000 Units by mouth daily.    RESTASIS 0.05 % ophthalmic emulsion Place 1 drop  into both eyes 2 (two) times daily. (Patient not taking: Reported on 07/11/2023)   No facility-administered encounter medications on file as of 07/11/2023.    Allergies (verified) Amlodipine, Hydralazine , Hydralazine  hcl, Norvasc [amlodipine besylate], Procardia  xl [nifedipine  er], Epinephrine, Ibuprofen, Lovastatin, Morphine, Tramadol, and Zocor [simvastatin]   History: Past Medical History:  Diagnosis Date   Anal pain 03/28/2019   Arthritis    Chronic gastritis without bleeding 07/29/2020   Chronic low back pain    Complication of anesthesia    All my muscles were sore all over my body was sore the next day.. due to mixture of gases   COVID-19    03/2021   Endometrial cancer (HCC)    Epigastric pain 07/29/2020   GERD (gastroesophageal reflux disease)    Heart murmur 2018   tiney murmur, no treatment needed   Hypertension    Hyponatremia    chronic    Iron deficiency 09/15/2022   NONSPECIFIC ABN FINDING RAD & OTH EXAM GI TRACT 07/06/2009   Qualifier: Diagnosis of  By: Harles Lied CMA (AAMA), Patty     Osteopenia    Prediabetes    Right foot pain 03/28/2019   RLS (restless legs syndrome)    S/P total knee arthroplasty 08/09/2016   Ulcer of the stomach caused by bacteria (H. pylori)    history of   Ulcer of the stomach caused by bacteria (H. pylori)    history of   Past Surgical History:  Procedure Laterality Date   ABDOMINAL HYSTERECTOMY  1992   BACK SURGERY  1989   L3, 4 and 5   DILATION AND CURETTAGE OF UTERUS  1992   KNEE ARTHROPLASTY Left 08/09/2016   Procedure: COMPUTER ASSISTED TOTAL KNEE ARTHROPLASTY;  Surgeon: Arlyne Lame, MD;  Location: [facility] ORS;  Service: Orthopedics;  Laterality: Left;   KNEE ARTHROSCOPY W/ MENISCAL REPAIR Left    Family History  Problem Relation Age of Onset   Breast cancer Daughter 66   Aneurysm Grandson        brain age 9 y.o died 09/08/19   Alzheimer's disease Father    Social History   Socioeconomic History   Marital status:  Widowed    Spouse name: Not on file   Number of children: Not on file   Years of education: Not on file   Highest education level: Professional school degree (e.g., MD, DDS, DVM, JD)  Occupational History   Not on file  Tobacco Use   Smoking status: Never   Smokeless tobacco: Never  Vaping Use   Vaping status: Never Used  Substance and Sexual Activity   Alcohol use: Yes    Comment: a glass of wine 2-3 times a year   Drug use: No   Sexual activity: Not Currently  Other Topics Concern   Not on file  Social History Narrative   Married lives with husband Paislyn Domenico who is DPR    Husband died 02/08/20   11-Apr-2019 married 65 years  Social Drivers of Corporate investment banker Strain: Low Risk  (07/11/2023)   Overall Financial Resource Strain (CARDIA)    Difficulty of Paying Living Expenses: Not hard at all  Food Insecurity: No Food Insecurity (07/11/2023)   Hunger Vital Sign    Worried About Running Out of Food in the Last Year: Never true    Ran Out of Food in the Last Year: Never true  Transportation Needs: No Transportation Needs (07/11/2023)   PRAPARE - Administrator, Civil Service (Medical): No    Lack of Transportation (Non-Medical): No  Recent Concern: Transportation Needs - Unmet Transportation Needs (07/10/2023)   PRAPARE - Administrator, Civil Service (Medical): Yes    Lack of Transportation (Non-Medical): No  Physical Activity: Inactive (07/11/2023)   Exercise Vital Sign    Days of Exercise per Week: 0 days    Minutes of Exercise per Session: 0 min  Stress: No Stress Concern Present (07/11/2023)   [employer] of Occupational Health - Occupational Stress Questionnaire    Feeling of Stress : Not at all  Social Connections: Moderately Isolated (07/11/2023)   Social Connection and Isolation Panel [NHANES]    Frequency of Communication with Friends and Family: Once a week    Frequency of Social Gatherings with Friends and Family: Twice a  week    Attends Religious Services: Never    Database administrator or Organizations: Yes    Attends Engineer, structural: More than 4 times per year    Marital Status: Widowed    Tobacco Counseling Counseling given: Not Answered    Clinical Intake:  Pre-visit preparation completed: Yes  Pain : No/denies pain     BMI - recorded: 33.33 Nutritional Status: BMI > 30  Obese Nutritional Risks: None Diabetes: No  Lab Results  Component Value Date   HGBA1C 6.0 (H) 09/15/2022     How often do you need to have someone help you when you read instructions, pamphlets, or other written materials from your doctor or pharmacy?: 1 - Never  Interpreter Needed?: No  Information entered by :: R. Anija Brickner LPN   Activities of Daily Living     07/11/2023   10:47 AM 07/10/2023    5:35 PM  In your present state of health, do you have any difficulty performing the following activities:  Hearing? 1 1  Comment wears aids   Vision? 0 0  Comment glasses   Difficulty concentrating or making decisions? 0 0  Walking or climbing stairs? 1 0  Dressing or bathing? 0 0  Doing errands, shopping? 0 0  Preparing Food and eating ? N N  Using the Toilet? N N  In the past six months, have you accidently leaked urine? N N  Do you have problems with loss of bowel control? N N  Managing your Medications? N N  Managing your Finances? N N  Housekeeping or managing your Housekeeping? Colie Dawes    Patient Care Team: Valli Gaw, MD as PCP - General (Family Medicine) Maudine Sos, MD as PCP - Cardiology (Cardiology)  I have updated your Care Teams any recent Medical Services you may have received from other providers in the past year.     Assessment:    This is a routine wellness examination for Nixie.  Hearing/Vision screen Hearing Screening - Comments:: Wears aids Vision Screening - Comments:: readers   Goals Addressed             This  Visit's Progress    Patient Stated        Wants to use cycle machine on a regular       Depression Screen     07/11/2023   11:01 AM 04/04/2023    9:49 AM 02/20/2023    8:33 AM 09/14/2022    2:46 PM 07/10/2022   10:33 AM 03/07/2022   10:28 AM 01/17/2022    9:49 AM  PHQ 2/9 Scores  PHQ - 2 Score 0 0 0 0 0 0 0  PHQ- 9 Score 0 0 0 0 0 0 0    Fall Risk     07/11/2023   10:53 AM 07/10/2023    5:35 PM 04/04/2023    9:48 AM 02/20/2023    8:33 AM 09/14/2022    2:45 PM  Fall Risk   Falls in the past year? 1 1 0 1 1  Number falls in past yr: 1 0 0 1 1  Injury with Fall? 0 0 0 0 0  Risk for fall due to : History of fall(s);Impaired balance/gait  No Fall Risks Impaired balance/gait No Fall Risks  Follow up Falls evaluation completed;Falls prevention discussed  Falls evaluation completed Education provided;Falls evaluation completed Falls evaluation completed    MEDICARE RISK AT HOME:  Medicare Risk at Home Any stairs in or around the home?: Yes If so, are there any without handrails?: No Home free of loose throw rugs in walkways, pet beds, electrical cords, etc?: Yes Adequate lighting in your home to reduce risk of falls?: Yes Life alert?: Yes Use of a cane, walker or w/c?: No Grab bars in the bathroom?: Yes Shower chair or bench in shower?: No Elevated toilet seat or a handicapped toilet?: No  TIMED UP AND GO:  Was the test performed?  No  Cognitive Function: 6CIT completed        07/11/2023   11:09 AM 07/10/2022   10:44 AM 03/22/2020    9:50 AM 03/20/2019   10:24 AM  6CIT Screen  What Year? 0 points 0 points 0 points 0 points  What month? 0 points 0 points 0 points 0 points  What time? 0 points 0 points 0 points 0 points  Count back from 20 0 points 2 points  0 points  Months in reverse 0 points 0 points 0 points 0 points  Repeat phrase 2 points 0 points  0 points  Total Score 2 points 2 points  0 points    Immunizations Immunization History  Administered Date(s) Administered   Fluad Quad(high Dose 65+)  11/15/2021, 11/22/2022   Influenza Inj Mdck Quad Pf 10/29/2017   Influenza, High Dose Seasonal PF 11/29/2016, 10/17/2018   Influenza,inj,Quad PF,6+ Mos 10/29/2017   Influenza-Unspecified 11/12/2019, 10/30/2020   Moderna Covid-19 Vaccine Bivalent Booster 79yrs & up 12/17/2020   Moderna Sars-Covid-2 Vaccination 03/26/2019, 04/23/2019, 01/12/2020, 07/20/2020   Pfizer(Comirnaty)Fall Seasonal Vaccine 12 years and older 10/19/2022   Pneumococcal Conjugate-13 09/12/2018   Pneumococcal Polysaccharide-23 03/18/2020   Tdap 10/18/2010, 04/20/2021   Zoster Recombinant(Shingrix ) 10/30/2020, 02/02/2021   Zoster, Live 05/01/2012    Screening Tests Health Maintenance  Topic Date Due   COVID-19 Vaccine (7 - 2024-25 season) 04/18/2023   Medicare Annual Wellness (AWV)  07/10/2023   INFLUENZA VACCINE  08/31/2023   DTaP/Tdap/Td (3 - Td or Tdap) 04/21/2031   Pneumonia Vaccine 57+ Years old  Completed   DEXA SCAN  Completed   Zoster Vaccines- Shingrix   Completed   HPV VACCINES  Aged Out   Meningococcal  B Vaccine  Aged Out    Health Maintenance  Health Maintenance Due  Topic Date Due   COVID-19 Vaccine (7 - 2024-25 season) 04/18/2023   Medicare Annual Wellness (AWV)  07/10/2023   Health Maintenance Items Addressed: Discussed the need to continue to get covid and annual flu vaccines.  Additional Screening:  Vision Screening: Recommended annual ophthalmology exams for early detection of glaucoma and other disorders of the eye. Up to date  Eye Would you like a referral to an eye doctor? No    Dental Screening: Recommended annual dental exams for proper oral hygiene  Community Resource Referral / Chronic Care Management: CRR required this visit?  No   CCM required this visit?  No   Plan:    I have personally reviewed and noted the following in the patient's chart:   Medical and social history Use of alcohol, tobacco or illicit drugs  Current medications and supplements  including opioid prescriptions. Patient is not currently taking opioid prescriptions. Functional ability and status Nutritional status Physical activity Advanced directives List of other physicians Hospitalizations, surgeries, and ER visits in previous 12 months Vitals Screenings to include cognitive, depression, and falls Referrals and appointments  In addition, I have reviewed and discussed with patient certain preventive protocols, quality metrics, and best practice recommendations. A written personalized care plan for preventive services as well as general preventive health recommendations were provided to patient.   Felicitas Horse, LPN   1/61/0960   After Visit Summary: (MyChart) Due to this being a telephonic visit, the after visit summary with patients personalized plan was offered to patient via MyChart   Notes: Nothing significant to report at this time.

## 2023-07-11 NOTE — Patient Instructions (Signed)
 Jill Stanley , Thank you for taking time out of your busy schedule to complete your Annual Wellness Visit with me. I enjoyed our conversation and look forward to speaking with you again next year. I, as well as your care team,  appreciate your ongoing commitment to your health goals. Please review the following plan we discussed and let me know if I can assist you in the future. Your Game plan/ To Do List    Referrals: If you haven't heard from the office you've been referred to, please reach out to them at the phone provided.  Remember to continue to get an annual flu vaccine and covid vaccine. Follow up Visits: Next Medicare AWV with our clinical staff: 07/14/24 @ 10:50   Have you seen your provider in the last 6 months (3 months if uncontrolled diabetes)? Yes Next Office Visit with your provider: 09/06/23  Clinician Recommendations:  Aim for 30 minutes of exercise or brisk walking, 6-8 glasses of water, and 5 servings of fruits and vegetables each day.       This is a list of the screening recommended for you and due dates:  Health Maintenance  Topic Date Due   COVID-19 Vaccine (7 - 2024-25 season) 04/18/2023   Mammogram  06/27/2023   Flu Shot  08/31/2023   Medicare Annual Wellness Visit  07/10/2024   DTaP/Tdap/Td vaccine (3 - Td or Tdap) 04/21/2031   Pneumonia Vaccine  Completed   DEXA scan (bone density measurement)  Completed   Zoster (Shingles) Vaccine  Completed   HPV Vaccine  Aged Out   Meningitis B Vaccine  Aged Out    Advanced directives: (Copy Requested) Please bring a copy of your health care power of attorney and living will to the office to be added to your chart at your convenience. You can mail to [facility] [address] [place], [place] [address] or email to [email or web address] Advance Care Planning is important because it:  [x]  Makes sure you receive the medical care that is consistent with your values, goals, and preferences  [x]  It  provides guidance to your family and loved ones and reduces their decisional burden about whether or not they are making the right decisions based on your wishes.

## 2023-07-24 ENCOUNTER — Ambulatory Visit
Admission: RE | Admit: 2023-07-24 | Discharge: 2023-07-24 | Disposition: A | Discharge status: Home / Self Care | Source: Ambulatory Visit | Attending: Family Medicine | Admitting: Family Medicine

## 2023-07-24 DIAGNOSIS — Z1231 Encounter for screening mammogram for malignant neoplasm of breast: Secondary | ICD-10-CM | POA: Diagnosis not present

## 2023-07-25 ENCOUNTER — Ambulatory Visit

## 2023-07-25 ENCOUNTER — Ambulatory Visit: Payer: Self-pay | Admitting: *Deleted

## 2023-07-25 VITALS — BP 124/80 | HR 74 | Ht 59.0 in | Wt 166.6 lb

## 2023-07-25 DIAGNOSIS — N939 Abnormal uterine and vaginal bleeding, unspecified: Secondary | ICD-10-CM | POA: Diagnosis not present

## 2023-07-25 LAB — URINALYSIS, ROUTINE W REFLEX MICROSCOPIC
Bilirubin Urine: NEGATIVE
Hgb urine dipstick: NEGATIVE
Ketones, ur: NEGATIVE
Nitrite: NEGATIVE
RBC / HPF: NONE SEEN (ref 0–?)
Specific Gravity, Urine: 1.01 (ref 1.000–1.030)
Total Protein, Urine: NEGATIVE
Urine Glucose: NEGATIVE
Urobilinogen, UA: 0.2 (ref 0.0–1.0)
pH: 7 (ref 5.0–8.0)

## 2023-07-25 NOTE — Telephone Encounter (Signed)
 FYI Only or Action Required?: FYI only for provider.  Patient was last seen in primary care on 04/04/2023 by Dineen Rollene MATSU, FNP. Called Nurse Triage reporting Vaginal Bleeding. Symptoms began today. Interventions attempted: Nothing. Symptoms are: stable.  Triage Disposition: See PCP Within 2 Weeks  Patient/caregiver understands and will follow disposition? Patient very concerned- appointment scheduled.   Reason for Disposition  Postmenopausal vaginal bleeding  Answer Assessment - Initial Assessment Questions 1. AMOUNT: Describe the bleeding that you are having. How much bleeding is there?    - SPOTTING: spotting, or pinkish / brownish mucous discharge; does not fill panty liner or pad    - MILD:  less than 1 pad / hour; less than patient's usual menstrual bleeding   - MODERATE: 1-2 pads / hour; 1 menstrual cup every 6 hours; small-medium blood clots (e.g., pea, grape, small coin)   - SEVERE: soaking 2 or more pads/hour for 2 or more hours; 1 menstrual cup every 2 hours; bleeding not contained by pads or continuous red blood from vagina; large blood clots (e.g., golf ball, large coin)      Bright blood when wipes 2. ONSET: When did the bleeding begin? Is it continuing now?     today 3. MENOPAUSE: When was your last menstrual period?      Hysterectomy 1990's  4. ABDOMEN PAIN: Do you have any pain? How bad is the pain?  (e.g., Scale 1-10; mild, moderate, or severe)   - MILD (1-3): doesn't interfere with normal activities, abdomen soft and not tender to touch    - MODERATE (4-7): interferes with normal activities or awakens from sleep, abdomen tender to touch    - SEVERE (8-10): excruciating pain, doubled over, unable to do any normal activities      No pain 5. BLOOD THINNERS: Do you take any blood thinners? (e.g., Coumadin/warfarin, Pradaxa/dabigatran, aspirin)     no 6. HORMONE MEDICINES: Are you taking any hormone medicines, prescription or OTC? (e.g., birth control  pills, estrogen)     no 7. CAUSE: What do you think is causing the bleeding? (e.g., recent gyn surgery, recent gyn procedure; known bleeding disorder, uterine cancer)       unknown 8. HEMODYNAMIC STATUS: Are you weak or feeling lightheaded? If Yes, ask: Can you stand and walk normally?       no 9. OTHER SYMPTOMS: What other symptoms are you having with the bleeding? (e.g., back pain, burning with urination, fever)     no  Protocols used: Vaginal Bleeding - Postmenopausal-A-AH    Copied from CRM [id number]. Topic: Clinical - Red Word Triage >> Jul 25, 2023  8:22 AM Burnard DEL wrote: Red Word that prompted transfer to Nurse Triage: vaginal bleeding

## 2023-07-25 NOTE — Patient Instructions (Addendum)
 1) Referral to ob/gyn was made today:  [facility] at [facility] [address] [place], [place] [address] [phone]  We will also update vaginal ultrasound before you see ob/gyn. This will help in determining cause of bleeding.    You can try Vaginal Moisturizer like K-Y jelly for vaginal dryness till you see ob/gyn.

## 2023-07-25 NOTE — Progress Notes (Signed)
 Acute Office Visit  Subjective:    Patient ID: Jill Stanley, female    DOB: September 14, 1932, 89 y.o.   MRN: [id number]  Chief Complaint  Patient presents with   Vaginal Bleeding   HPI:  One episode of bleeding when she was wiping after peeing. Noticed frank red blood on toilet paper. She has a history of endometrial cancer in the past and is s/p abdominal hysterectomy in 1992. No vaginal bleeding in the past, no vaginal dryness. No unintentional weight loss, fever, chills, vaginal discharge. No urinary urgency, frequency.   She has a h/o constipation, takes daily Miralax supplement at night. Her last Hb was normal on on 06/18/23. Has chronic h/o chronic hyponatremia and sees nephrologist for it.    As per HPI    Objective:    BP 124/80 (BP Location: Right Arm, Patient Position: Sitting, Cuff Size: Normal)   Pulse 74   Ht 4' 11 (1.499 m)   Wt 166 lb 9.6 oz (75.6 kg)   SpO2 98%   BMI 33.65 kg/m    Physical Exam Exam conducted with a chaperone present.  Constitutional:      Appearance: Normal appearance.  HENT:     Head: Normocephalic and atraumatic.     Mouth/Throat:     Mouth: Mucous membranes are moist.  Neck:     Thyroid : No thyroid  mass or thyroid  tenderness.   Cardiovascular:     Rate and Rhythm: Normal rate and regular rhythm.  Pulmonary:     Effort: Pulmonary effort is normal.     Breath sounds: Normal breath sounds.  Abdominal:     General: Bowel sounds are normal.     Palpations: Abdomen is soft.     Tenderness: There is no right CVA tenderness, left CVA tenderness, guarding or rebound.  Genitourinary:    Labia:        Right: No tenderness or lesion.        Left: No tenderness or lesion.      Urethra: Urethral lesion present.     Vagina: No erythema, bleeding or lesions.     Rectum: External hemorrhoid present.     Comments: Urethral meatus: Observed a small red dot on the urethral meatus measuring about 1 mm in diameter, non-tender, without discharge.    External hemorrhoid is present at the 5 o'clock position. It is measuring about 1 cm in diameter, normal skin color, non-thrombosed, non-tender, not active bleeding noted.      Musculoskeletal:     Cervical back: Neck supple.     Right lower leg: No edema.     Left lower leg: No edema.   Skin:    General: Skin is warm.   Neurological:     Mental Status: She is alert and oriented to person, place, and time.   Psychiatric:        Mood and Affect: Mood normal.        Behavior: Behavior normal.     No results found for any visits on 07/25/23.     Assessment & Plan:  Vaginal bleeding Assessment & Plan: Not actively bleeding, source of bleeding unsure but could be from irritation from urethral meatus, vaginal bleeding, hematuria, hemorrhoidal. Given unsure source of bleeding, h/o endometrial cancer recommend getting UA, transvaginal ultrasound and referral to ob/gyn. Management of hemorrhoid discussed as well. For vaginal dryness recommend using OTC water-based vaginal lubricant prn.   Orders: -     Ambulatory referral to Obstetrics / Gynecology -  Urinalysis, Routine w reflex microscopic -     US  PELVIC COMPLETE WITH TRANSVAGINAL; Future  I spent 40 minutes on the day of this face-to-face encounter reviewing the patient's medical and surgical history, ongoing concerns, and reviewing the assessment and plan with the patient.  Additionally, I spent time post-visit ordering and reviewing diagnostics and therapeutics with the patient.  Return if symptoms worsen or fail to improve.  Luke Shade, MD

## 2023-07-25 NOTE — Assessment & Plan Note (Addendum)
 Not actively bleeding, source of bleeding unsure but could be from irritation from urethral meatus, vaginal bleeding, hematuria, hemorrhoidal. Given unsure source of bleeding, h/o endometrial cancer recommend getting UA, transvaginal ultrasound and referral to ob/gyn. Management of hemorrhoid discussed as well. For vaginal dryness recommend using OTC water-based vaginal lubricant prn.

## 2023-07-26 ENCOUNTER — Ambulatory Visit: Payer: Self-pay

## 2023-07-26 ENCOUNTER — Ambulatory Visit
Admission: RE | Admit: 2023-07-26 | Discharge: 2023-07-26 | Disposition: A | Discharge status: Home / Self Care | Source: Ambulatory Visit

## 2023-07-26 DIAGNOSIS — N3090 Cystitis, unspecified without hematuria: Secondary | ICD-10-CM | POA: Insufficient documentation

## 2023-07-26 DIAGNOSIS — N939 Abnormal uterine and vaginal bleeding, unspecified: Secondary | ICD-10-CM | POA: Insufficient documentation

## 2023-07-26 DIAGNOSIS — Z90710 Acquired absence of both cervix and uterus: Secondary | ICD-10-CM | POA: Diagnosis not present

## 2023-07-26 MED ORDER — SULFAMETHOXAZOLE-TRIMETHOPRIM 800-160 MG PO TABS: 1.0000 | ORAL_TABLET | Freq: Two times a day (BID) | ORAL | 0 refills | Status: AC

## 2023-07-26 NOTE — Progress Notes (Signed)
 Please convey the following to the patient:  Your recent urine test showed a mild presence of leukocytes, likely due to urethral irritation seen on exam yesterday, and less likely from a urinary tract infection. I'm hesitant to start antibiotics now, but since we are going into the weekend I wanted you to have antibiotic available if you were to start having UTI like symptoms (urinary frequency, urgency, worsening burning with peeing).   I've sent a prescription for Bactrim to your pharmacy ([facility]). Please use it one tablet twice a day for 3 days only if you develop UTI symptoms.  Call the pharmacy to fill it if needed.  I also recommend taking over-the-counter probiotic like Culturelle for five days to minimize potential side effects of antibiotic.   Thank you,  Luke Shade, MD

## 2023-07-27 ENCOUNTER — Telehealth: Payer: Self-pay

## 2023-07-27 NOTE — Telephone Encounter (Signed)
 Sending to doc of day for recommendation

## 2023-07-27 NOTE — Telephone Encounter (Signed)
 Copied from CRM [id number]. Topic: Clinical - Medication Question >> Jul 27, 2023  2:56 PM Burnard DEL wrote: Reason for CRM: Patient was prescribed sulfamethoxazole -trimethoprim  (BACTRIM  DS) 800-160 MG tablet.She called in stating that she takes lisinopril  (ZESTRIL ) 40 MG tablet as well and she said that she read where this medication may interfere with it. she would like to know will it be safe for her to take?

## 2023-07-27 NOTE — Telephone Encounter (Unsigned)
 Copied from CRM [id number]. Topic: Clinical - Lab/Test Results >> Jul 27, 2023 11:25 AM Elle L wrote: Reason for CRM: The patient called back regarding her lab results. I read the note verbatim and she expressed understanding. She also advised that she is not currently having symptoms of a UTI.

## 2023-07-27 NOTE — Telephone Encounter (Signed)
 Pt.notified

## 2023-07-28 NOTE — Progress Notes (Signed)
 Please let the patient know transvaginal ultrasound looks normal.   Thank you, Luke Shade, MD

## 2023-07-28 NOTE — Telephone Encounter (Signed)
 Noted.   Thank you,  Luke Shade, MD

## 2023-08-02 ENCOUNTER — Encounter: Payer: Self-pay | Admitting: Obstetrics and Gynecology

## 2023-08-02 ENCOUNTER — Ambulatory Visit: Admitting: Obstetrics and Gynecology

## 2023-08-02 VITALS — BP 152/67 | HR 58 | Resp 16 | Ht 59.0 in | Wt 167.6 lb

## 2023-08-02 DIAGNOSIS — N950 Postmenopausal bleeding: Secondary | ICD-10-CM

## 2023-08-02 NOTE — Progress Notes (Signed)
 HPI:      [name] is a 89 y.o. No obstetric history on file. who LMP was No LMP recorded. Patient has had a hysterectomy.  Subjective:   She presents today because she had a single episode of vaginal spotting.  She has not had any bleeding since that time.  She has no pain.  She in fact states that she has no concerns at this time. Of significant note, patient has previously had a hysterectomy and bilateral oophorectomies.    Hx: The following portions of the patient's history were reviewed and updated as appropriate:             She  has a past medical history of Anal pain (03/28/2019), Arthritis, Chronic gastritis without bleeding (07/29/2020), Chronic low back pain, Complication of anesthesia, COVID-19, Endometrial cancer (HCC), Epigastric pain (07/29/2020), GERD (gastroesophageal reflux disease), Heart murmur (2018), Hypertension, Hyponatremia, Iron deficiency (09/15/2022), NONSPECIFIC ABN FINDING RAD & OTH EXAM GI TRACT (07/06/2009), Osteopenia, Prediabetes, Right foot pain (03/28/2019), RLS (restless legs syndrome), S/P total knee arthroplasty (08/09/2016), Ulcer of the stomach caused by bacteria (H. pylori), and Ulcer of the stomach caused by bacteria (H. pylori). She does not have any pertinent problems on file. She  has a past surgical history that includes Abdominal hysterectomy (1992); Back surgery (1989); Dilation and curettage of uterus (1992); Knee arthroscopy w/ meniscal repair (Left); and Knee Arthroplasty (Left, 08/09/2016). Her family history includes Alzheimer's disease in her father; Aneurysm in her grandson; Breast cancer (age of onset: 33) in her daughter. She  reports that she has never smoked. She has never used smokeless tobacco. She reports current alcohol use. She reports that she does not use drugs. She has a current medication list which includes the following prescription(s): acetaminophen , calcium  carbonate-vitamin d3, calcium  elemental as carbonate,  carvedilol , clonidine , cyanocobalamin , esomeprazole , ezetimibe , lisinopril , polyethyl glycol-propyl glycol, restasis, sodium chloride , solifenacin , spironolactone , and vitamin d . She is allergic to amlodipine, hydralazine , hydralazine  hcl, norvasc [amlodipine besylate], procardia  xl [nifedipine  er], epinephrine, ibuprofen, lovastatin, morphine, tramadol, and zocor [simvastatin].       Review of Systems:  Review of Systems  Constitutional: Denied constitutional symptoms, night sweats, recent illness, fatigue, fever, insomnia and weight loss.  Eyes: Denied eye symptoms, eye pain, photophobia, vision change and visual disturbance.  Ears/Nose/Throat/Neck: Denied ear, nose, throat or neck symptoms, hearing loss, nasal discharge, sinus congestion and sore throat.  Cardiovascular: Denied cardiovascular symptoms, arrhythmia, chest pain/pressure, edema, exercise intolerance, orthopnea and palpitations.  Respiratory: Denied pulmonary symptoms, asthma, pleuritic pain, productive sputum, cough, dyspnea and wheezing.  Gastrointestinal: Denied, gastro-esophageal reflux, melena, nausea and vomiting.  Genitourinary: Denied genitourinary symptoms including symptomatic vaginal discharge, pelvic relaxation issues, and urinary complaints.  Musculoskeletal: Denied musculoskeletal symptoms, stiffness, swelling, muscle weakness and myalgia.  Dermatologic: Denied dermatology symptoms, rash and scar.  Neurologic: Denied neurology symptoms, dizziness, headache, neck pain and syncope.  Psychiatric: Denied psychiatric symptoms, anxiety and depression.  Endocrine: Denied endocrine symptoms including hot flashes and night sweats.   Meds:   Current Outpatient Medications on File Prior to Visit  Medication Sig Dispense Refill   acetaminophen  (TYLENOL ) 650 MG CR tablet Take 1,300 mg by mouth every 8 (eight) hours as needed for pain.     Calcium  Carbonate-Vitamin D3 600-400 MG-UNIT TABS Take 2 tablets by mouth daily.       calcium  elemental as carbonate (BARIATRIC TUMS ULTRA) 400 MG chewable tablet Chew 1,000 mg by mouth as needed for heartburn.     carvedilol  (COREG ) 3.125  MG tablet TAKE 1 TABLET TWICE DAILY WITH MEALS 180 tablet 3   cloNIDine  (CATAPRES ) 0.1 MG tablet TAKE 1 TABLET (0.1 MG TOTAL) BY MOUTH IN THE MORNING AND AT BEDTIME. 180 tablet 3   cyanocobalamin  (VITAMIN B12) 250 MCG tablet Take 250 mcg by mouth daily.     esomeprazole  (NEXIUM ) 20 MG capsule TAKE 1 CAPSULE DAILY AT 12 NOON 90 capsule 3   ezetimibe  (ZETIA ) 10 MG tablet TAKE 1 TABLET EVERY DAY 90 tablet 3   lisinopril  (ZESTRIL ) 40 MG tablet TAKE 1 TABLET EVERY DAY 90 tablet 3   Polyethyl Glycol-Propyl Glycol (SYSTANE OP) Apply to eye as needed.     RESTASIS 0.05 % ophthalmic emulsion Place 1 drop into both eyes 2 (two) times daily. (Patient not taking: Reported on 07/25/2023)     sodium chloride  (OCEAN) 0.65 % SOLN nasal spray Place 2 sprays into both nostrils as needed for congestion.     solifenacin  (VESICARE ) 5 MG tablet TAKE 1 TABLET EVERY DAY 90 tablet 3   spironolactone  (ALDACTONE ) 25 MG tablet Take 1 tablet (25 mg total) by mouth daily. In am See change in sig 1x per day 90 tablet 3   VITAMIN D  PO Take 2,000 Units by mouth daily.      No current facility-administered medications on file prior to visit.      Objective:     Vitals:   08/02/23 1129  BP: (!) 152/67  Pulse: (!) 58  Resp: 16   Filed Weights   08/02/23 1129  Weight: 167 lb 9.6 oz (76 kg)              Ultrasound shows no pelvic organs-no abnormalities          Assessment:    No obstetric history on file. Patient Active Problem List   Diagnosis Date Noted   Cystitis 07/26/2023   Vaginal bleeding 07/25/2023   Viral URI 04/04/2023   Hyperkalemia 03/03/2023   Xeroderma 03/03/2023   Chronic hyponatremia 09/15/2022   Statin intolerance 11/21/2021   Overactive bladder 11/21/2021   Aortic atherosclerosis (HCC) 07/29/2021   Cataract 07/29/2020   Reduced vision  07/29/2020   Chronic left shoulder pain 07/29/2020   Internal hemorrhoids 04/07/2019   Hyperlipidemia 09/12/2018   Hypertension    Prediabetes    GERD (gastroesophageal reflux disease)    RLS (restless legs syndrome)    Arthritis    Chronic low back pain    Osteopenia    Hyponatremia      1. Postmenopausal bleeding     Single episode of spotting and nothing since that time.   Plan:            1.  Discussed atrophic vaginitis and treatment-patient declined.  2.  Advised her that if she has any further bleeding she should contact us  and we can discuss options. Orders No orders of the defined types were placed in this encounter.   No orders of the defined types were placed in this encounter.     F/U  No follow-ups on file.  Alm DOROTHA Sar, M.D. 08/02/2023 2:02 PM

## 2023-08-15 ENCOUNTER — Other Ambulatory Visit: Payer: Self-pay | Admitting: Family Medicine

## 2023-08-15 DIAGNOSIS — I10 Essential (primary) hypertension: Secondary | ICD-10-CM

## 2023-08-20 ENCOUNTER — Ambulatory Visit: Payer: Medicare PPO | Admitting: Family Medicine

## 2023-09-06 ENCOUNTER — Ambulatory Visit

## 2023-09-06 VITALS — BP 130/80 | HR 62 | Ht 59.0 in | Wt 168.2 lb

## 2023-09-06 DIAGNOSIS — E785 Hyperlipidemia, unspecified: Secondary | ICD-10-CM

## 2023-09-06 DIAGNOSIS — M25512 Pain in left shoulder: Secondary | ICD-10-CM

## 2023-09-06 DIAGNOSIS — Q809 Congenital ichthyosis, unspecified: Secondary | ICD-10-CM | POA: Diagnosis not present

## 2023-09-06 DIAGNOSIS — N3281 Overactive bladder: Secondary | ICD-10-CM

## 2023-09-06 DIAGNOSIS — R7303 Prediabetes: Secondary | ICD-10-CM

## 2023-09-06 DIAGNOSIS — K219 Gastro-esophageal reflux disease without esophagitis: Secondary | ICD-10-CM | POA: Diagnosis not present

## 2023-09-06 DIAGNOSIS — E871 Hypo-osmolality and hyponatremia: Secondary | ICD-10-CM | POA: Diagnosis not present

## 2023-09-06 DIAGNOSIS — I10 Essential (primary) hypertension: Secondary | ICD-10-CM

## 2023-09-06 DIAGNOSIS — E782 Mixed hyperlipidemia: Secondary | ICD-10-CM | POA: Diagnosis not present

## 2023-09-06 DIAGNOSIS — I1A0 Resistant hypertension: Secondary | ICD-10-CM

## 2023-09-06 DIAGNOSIS — Z789 Other specified health status: Secondary | ICD-10-CM

## 2023-09-06 DIAGNOSIS — G8929 Other chronic pain: Secondary | ICD-10-CM

## 2023-09-06 DIAGNOSIS — R202 Paresthesia of skin: Secondary | ICD-10-CM | POA: Diagnosis not present

## 2023-09-06 LAB — VITAMIN B12: Vitamin B-12: >1500 pg/mL — ABNORMAL HIGH (ref 211–911)

## 2023-09-06 LAB — HEMOGLOBIN A1C: Hgb A1c MFr Bld: 6.2 % (ref 4.6–6.5)

## 2023-09-06 LAB — TSH: TSH: 1.36 u[IU]/mL (ref 0.35–5.50)

## 2023-09-06 MED ORDER — LISINOPRIL 40 MG PO TABS: 40.0000 mg | ORAL_TABLET | Freq: Every day | ORAL | 3 refills | Status: AC

## 2023-09-06 MED ORDER — CARVEDILOL 3.125 MG PO TABS: 3.1250 mg | ORAL_TABLET | Freq: Two times a day (BID) | ORAL | 3 refills | Status: AC

## 2023-09-06 MED ORDER — CLONIDINE HCL 0.1 MG PO TABS: 0.1000 mg | ORAL_TABLET | Freq: Two times a day (BID) | ORAL | 3 refills | Status: AC

## 2023-09-06 MED ORDER — EZETIMIBE 10 MG PO TABS: 10.0000 mg | ORAL_TABLET | Freq: Every day | ORAL | 3 refills | Status: AC

## 2023-09-06 MED ORDER — SPIRONOLACTONE 25 MG PO TABS: 25.0000 mg | ORAL_TABLET | Freq: Every day | ORAL | 3 refills | Status: AC

## 2023-09-06 MED ORDER — ESOMEPRAZOLE MAGNESIUM 20 MG PO CPDR: DELAYED_RELEASE_CAPSULE | ORAL | 3 refills | Status: AC

## 2023-09-06 MED ORDER — SOLIFENACIN SUCCINATE 5 MG PO TABS: 5.0000 mg | ORAL_TABLET | Freq: Every day | ORAL | 3 refills | Status: AC

## 2023-09-06 NOTE — Assessment & Plan Note (Signed)
 Has a h/o H pylori infection in the past.  Symptoms stable on Esomeprazole  20 mg daily, continue. Refill sent. Will check B 12 today.

## 2023-09-06 NOTE — Assessment & Plan Note (Signed)
 Check A1c, counseling on low carbohydrate diet, regular exercise provided.

## 2023-09-06 NOTE — Assessment & Plan Note (Addendum)
-   Chronic, asymptomatic. Spironolactone  may further exacerbate hyponatremia. Patient counseled on this. She has had difficulty with BP control in the past and has required Spironolactone  for BP control.  - Check sodium levels. Fluid restrictions to 1L/day. - Check thyroid  hormone levels. - Consider referral to cardiology in the future.  - Continue regular f/u with nephrologist.

## 2023-09-06 NOTE — Assessment & Plan Note (Signed)
 Doing well on Vesicare  5 mg daily. Continue, if new symptoms arise recommend referral to urology. Refill sent.

## 2023-09-06 NOTE — Assessment & Plan Note (Signed)
 Managed with as-needed Tylenol . Rarely needed now.

## 2023-09-06 NOTE — Assessment & Plan Note (Signed)
 Occasional tinglings in the tips of her right fingers,  which occurs sporadically and is at times associated with specific activity like typing. D/D carpal tunnel, peripheral neuropathy, hyponatremia, cervical radiculopathy.   Happening infrequently and patient reports she is not bothered by it. She is not looking for further evaluation at this time.   Recommend monitoring for symptoms and follow up if persistent, worsening. Discussed potential neurology referral, cervical spine imaging in the future.

## 2023-09-06 NOTE — Assessment & Plan Note (Addendum)
-   Well controlled on current regimen. Blood pressure at today's visit  130/80 mmHg - Continue Carvedilol  3.125 mg BID, Lisinopril  40 mg daily, Clonidine  0.1 mg BID, Spironolactone  25 mg daily.  -  Spironolactone  may further exacerbate hyponatremia. Patient counseled on this. She has had difficulty with BP control in the past and has required Spironolactone  for BP control. Consider cardiology referral if difficulty controlling BP.

## 2023-09-06 NOTE — Patient Instructions (Signed)
-   Use of mild cleansers mild soap, detergent. - Avoidance of excessive and aggressive skin washing/scrubbing. - Cleansers (Cetaphil, Cerave, Neutrogena) - Moisturizers (Cetaphil, CeraVe, Neutrogena)  - You can try preservative, fragrance free Dove bar soap as well.  - Use Hydrocortisone  cream twice a day for the next 7 days then once a day for 7 days after that. If the itching persists please schedule an appointment with your dermatologist.

## 2023-09-06 NOTE — Assessment & Plan Note (Addendum)
 Not on statin secondary to side effects diarrhea, nausea and vomiting.  On Zetia  10 mg daily for LDL lowering, continue

## 2023-09-06 NOTE — Assessment & Plan Note (Signed)
 Not on statin secondary to side effects diarrhea, nausea and vomiting.  On Zetia  10 mg daily for LDL lowering, continue

## 2023-09-06 NOTE — Assessment & Plan Note (Signed)
 Seems to be chronic with intermittent symptoms. No visible rash or other skin changes. Gentle skin care and following counseling provided  - Use of mild cleansers mild soap, detergent. - Avoidance of excessive and aggressive skin washing/scrubbing. - Cleansers (Cetaphil, Cerave, Neutrogena) - Moisturizers (Cetaphil, CeraVe, Neutrogena)  - You can try preservative, fragrance free Dove bar soap as well.  - Use Hydrocortisone  cream twice a day for the next 7 days then once a day for 7 days after that. If the itching persists please schedule an appointment with your dermatologist.

## 2023-09-06 NOTE — Progress Notes (Signed)
 Established Patient Office Visit\ TOC from Dr. Hope    Subjective  Patient ID: Jill Stanley, female    DOB: 04-24-1932  Age: 89 y.o. MRN: [id number]  Chief Complaint  Patient presents with   Establish Care    She  has a past medical history of Anal pain (03/28/2019), Arthritis, Chronic gastritis without bleeding (07/29/2020), Chronic low back pain, Complication of anesthesia, COVID-19, Endometrial cancer (HCC), Epigastric pain (07/29/2020), GERD (gastroesophageal reflux disease), Heart murmur (2018), Hypertension, Hyponatremia, Iron deficiency (09/15/2022), NONSPECIFIC ABN FINDING RAD & OTH EXAM GI TRACT (07/06/2009), Osteopenia, Prediabetes, Reduced vision (07/29/2020), Right foot pain (03/28/2019), RLS (restless legs syndrome), S/P total knee arthroplasty (08/09/2016), Ulcer of the stomach caused by bacteria (H. pylori), and Ulcer of the stomach caused by bacteria (H. pylori).  HPI Discussed the use of AI scribe software for clinical note transcription with the patient, who gave verbal consent to proceed.  History of Present Illness Jill Stanley is an 89 year old female who presents for a transfer of care.   She has a history of hypertension, overactive bladder, postmenopausal vaginal atrophy. She experienced a previous episode of spotting, which was evaluated by an OB GYN and attributed to atrophic vaginitis. No further bleeding, itchiness, or dryness has occurred since the initial episode. She is currently taking Solifenacin  5 mg daily, needs refill. She reports excellent symptoms control on this, has not seen urologist in the past.   She takes Coreg  (carvedilol ) twice a day, clonidine  twice a day, lisinopril  once a day, and spironolactone  once a day for hypertension. Previously, her blood pressure was uncontrolled, reaching 216 systolic, but has improved with her current medication regimen. Home BP stable with current treatment. No lower leg edema, chest pain, palpations.   She  has a h/o intolerance to Simvastatin (nausea, diarrhea), is on Zetia .    She has a history of chronically low sodium levels, with a previous critical level of 121, which required hospitalization. She reports that her sodium is usually around 130-131. She is established with nephrologist Dr. Dennise for this.   She is aware of her prediabetic status, with a previous A1c of 6.0%. She is trying to manage her diet and weight, acknowledging an increase in sweet consumption since her husband's passing.  She takes Nexium  daily for a past gastric ulcer due to H. pylori and occasionally uses Tums for additional relief. She sometimes forgets to take Nexium  on an empty stomach but finds it effective when taken with other medications.  She experiences occasional tinglings in the tips of her right fingers, which occurs sporadically and is occasionally associated with specific activity like typing.   She reports an itch behind her left ear extending to her shoulder, which has been present for several months. There is no associated rash, and over-the-counter treatments have been ineffective. She suspects it may be related to her emergency cord worn at night.  She lives alone in an apartment with convenient access to an elevator and carport. She rarely consumes alcohol, only during social occasions, and has refrained from drinking due to medication concerns.  She has a h/o vitamin B 12 deficiency in the past and is taking daily vitamin B 12 supplement. She is also taking daily vitamin D  and calcium  supplement.   ROS As per HPI    Objective:     BP 130/80 (BP Location: Right Arm, Patient Position: Sitting, Cuff Size: Normal)   Pulse 62   Ht 4' 11 (1.499 m)   Wt  168 lb 3.2 oz (76.3 kg)   SpO2 98%   BMI 33.97 kg/m      09/06/2023   10:53 AM 07/25/2023    1:12 PM 07/11/2023   11:01 AM  Depression screen PHQ 2/9  Decreased Interest 0 0 0  Down, Depressed, Hopeless 0 0 0  PHQ - 2 Score 0 0 0  Altered  sleeping 0 0 0  Tired, decreased energy 0 0 0  Change in appetite 0 0 0  Feeling bad or failure about yourself  0 0 0  Trouble concentrating 0 0 0  Moving slowly or fidgety/restless 0 0 0  Suicidal thoughts 0 0 0  PHQ-9 Score 0 0 0  Difficult doing work/chores Not difficult at all Not difficult at all Not difficult at all      09/06/2023   10:53 AM 07/25/2023    1:12 PM 04/04/2023    9:49 AM 02/20/2023    8:33 AM  GAD 7 : Generalized Anxiety Score  Nervous, Anxious, on Edge 0 0 0 0  Control/stop worrying 0 0 0 0  Worry too much - different things 0 0 0 0  Trouble relaxing 0 0 0 0  Restless 0 0 0 0  Easily annoyed or irritable 0 0 0 0  Afraid - awful might happen 0 0 0 0  Total GAD 7 Score 0 0 0 0  Anxiety Difficulty Not difficult at all Not difficult at all Not difficult at all Not difficult at all      09/06/2023   10:53 AM 07/25/2023    1:12 PM 07/11/2023   11:01 AM  Depression screen PHQ 2/9  Decreased Interest 0 0 0  Down, Depressed, Hopeless 0 0 0  PHQ - 2 Score 0 0 0  Altered sleeping 0 0 0  Tired, decreased energy 0 0 0  Change in appetite 0 0 0  Feeling bad or failure about yourself  0 0 0  Trouble concentrating 0 0 0  Moving slowly or fidgety/restless 0 0 0  Suicidal thoughts 0 0 0  PHQ-9 Score 0 0 0  Difficult doing work/chores Not difficult at all Not difficult at all Not difficult at all      09/06/2023   10:53 AM 07/25/2023    1:12 PM 04/04/2023    9:49 AM 02/20/2023    8:33 AM  GAD 7 : Generalized Anxiety Score  Nervous, Anxious, on Edge 0 0 0 0  Control/stop worrying 0 0 0 0  Worry too much - different things 0 0 0 0  Trouble relaxing 0 0 0 0  Restless 0 0 0 0  Easily annoyed or irritable 0 0 0 0  Afraid - awful might happen 0 0 0 0  Total GAD 7 Score 0 0 0 0  Anxiety Difficulty Not difficult at all Not difficult at all Not difficult at all Not difficult at all   SDOH Screenings   Food Insecurity: No Food Insecurity (07/11/2023)  Housing: Low Risk   (07/11/2023)  Transportation Needs: No Transportation Needs (07/11/2023)  Recent Concern: Transportation Needs - Unmet Transportation Needs (07/10/2023)  Utilities: Not At Risk (07/11/2023)  Alcohol Screen: Low Risk  (07/11/2023)  Depression (PHQ2-9): Low Risk  (09/06/2023)  Financial Resource Strain: Low Risk  (07/11/2023)  Physical Activity: Inactive (07/11/2023)  Social Connections: Moderately Isolated (07/11/2023)  Stress: No Stress Concern Present (07/11/2023)  Tobacco Use: Low Risk  (09/06/2023)  Health Literacy: Inadequate Health Literacy (07/11/2023)  Physical Exam Constitutional:      Appearance: Normal appearance. She is obese.  HENT:     Head: Normocephalic and atraumatic.     Right Ear: Tympanic membrane and external ear normal. There is no impacted cerumen.     Left Ear: Tympanic membrane normal.     Ears:     Comments: Wearing hearing aids b/l    Mouth/Throat:     Mouth: Mucous membranes are moist.  Neck:     Thyroid : No thyroid  mass or thyroid  tenderness.  Cardiovascular:     Rate and Rhythm: Normal rate and regular rhythm.  Pulmonary:     Effort: Pulmonary effort is normal.     Breath sounds: Normal breath sounds. No wheezing.  Abdominal:     General: Abdomen is protuberant. Bowel sounds are normal.     Palpations: Abdomen is soft.     Tenderness: There is no abdominal tenderness.  Musculoskeletal:     Right shoulder: Decreased range of motion.     Left shoulder: Decreased range of motion.     Right wrist: No swelling. Normal pulse.     Left wrist: No swelling. Normal pulse.     Right hand: No lacerations. Normal range of motion. Normal strength. Normal pulse.     Left hand: No lacerations. Normal range of motion. Normal strength. Normal pulse.     Cervical back: Neck supple. No rigidity.     Right lower leg: No edema.     Left lower leg: No edema.     Comments: Negative Tinel's sign on right.  Prominent multiple DIP b/l without swelling or pain on palpation over  the joint space B/L shoulder:  Reduced ROM on anterior flexion, external rotation and abduction of the shoulder due to pain. Positive empty can test on the right side.   Skin:    General: Skin is warm.  Neurological:     Mental Status: She is alert and oriented to person, place, and time.  Psychiatric:        Mood and Affect: Mood normal.        Behavior: Behavior normal.        No results found for any visits on 09/06/23.  The ASCVD Risk score (Arnett DK, et al., 2019) failed to calculate for the following reasons:   The 2019 ASCVD risk score is only valid for ages 30 to 59     Assessment & Plan:   Resistant hypertension Assessment & Plan: - Well controlled on current regimen. Blood pressure at today's visit  130/80 mmHg - Continue Carvedilol  3.125 mg BID, Lisinopril  40 mg daily, Clonidine  0.1 mg BID, Spironolactone  25 mg daily.  -  Spironolactone  may further exacerbate hyponatremia. Patient counseled on this. She has had difficulty with BP control in the past and has required Spironolactone  for BP control. Consider cardiology referral if difficulty controlling BP.   Orders: -     Carvedilol ; Take 1 tablet (3.125 mg total) by mouth 2 (two) times daily with a meal.  Dispense: 180 tablet; Refill: 3 -     cloNIDine  HCl; Take 1 tablet (0.1 mg total) by mouth in the morning and at bedtime.  Dispense: 180 tablet; Refill: 3 -     Lisinopril ; Take 1 tablet (40 mg total) by mouth daily.  Dispense: 90 tablet; Refill: 3 -     Spironolactone ; Take 1 tablet (25 mg total) by mouth daily. In am See change in sig 1x per day  Dispense: 90 tablet; Refill:  3  Gastroesophageal reflux disease, unspecified whether esophagitis present Assessment & Plan: Has a h/o H pylori infection in the past.  Symptoms stable on Esomeprazole  20 mg daily, continue. Refill sent. Will check B 12 today.  Orders: -     Esomeprazole  Magnesium ; TAKE 1 CAPSULE DAILY  Dispense: 90 capsule; Refill: 3 -     Vitamin  B12  Mixed hyperlipidemia Assessment & Plan: Not on statin secondary to side effects diarrhea, nausea and vomiting.  On Zetia  10 mg daily for LDL lowering, continue   Orders: -     Ezetimibe ; Take 1 tablet (10 mg total) by mouth daily.  Dispense: 90 tablet; Refill: 3 -     Lipid panel  Statin intolerance Assessment & Plan: Not on statin secondary to side effects diarrhea, nausea and vomiting.  On Zetia  10 mg daily for LDL lowering, continue     Prediabetes Assessment & Plan: Check A1c, counseling on low carbohydrate diet, regular exercise provided.   Orders: -     Hemoglobin A1c  Xeroderma Assessment & Plan: Seems to be chronic with intermittent symptoms. No visible rash or other skin changes. Gentle skin care and following counseling provided  - Use of mild cleansers mild soap, detergent. - Avoidance of excessive and aggressive skin washing/scrubbing. - Cleansers (Cetaphil, Cerave, Neutrogena) - Moisturizers (Cetaphil, CeraVe, Neutrogena)  - You can try preservative, fragrance free Dove bar soap as well.  - Use Hydrocortisone  cream twice a day for the next 7 days then once a day for 7 days after that. If the itching persists please schedule an appointment with your dermatologist.     Chronic hyponatremia Assessment & Plan: - Chronic, asymptomatic. Spironolactone  may further exacerbate hyponatremia. Patient counseled on this. She has had difficulty with BP control in the past and has required Spironolactone  for BP control.  - Check sodium levels. Fluid restrictions to 1L/day. - Check thyroid  hormone levels. - Consider referral to cardiology in the future.  - Continue regular f/u with nephrologist.   Orders: -     TSH -     Comprehensive metabolic panel with GFR  Overactive bladder Assessment & Plan: Doing well on Vesicare  5 mg daily. Continue, if new symptoms arise recommend referral to urology. Refill sent.   Orders: -     Solifenacin  Succinate; Take 1 tablet (5  mg total) by mouth daily.  Dispense: 90 tablet; Refill: 3  Hand tingling Assessment & Plan: Occasional tinglings in the tips of her right fingers,  which occurs sporadically and is at times associated with specific activity like typing. D/D carpal tunnel, peripheral neuropathy, hyponatremia, cervical radiculopathy.   Happening infrequently and patient reports she is not bothered by it. She is not looking for further evaluation at this time.   Recommend monitoring for symptoms and follow up if persistent, worsening. Discussed potential neurology referral, cervical spine imaging in the future.    Chronic left shoulder pain Assessment & Plan: Managed with as-needed Tylenol . Rarely needed now.    I personally spent a total of 55 minutes in the care of the patient today including preparing to see the patient, getting/reviewing separately obtained history, performing a medically appropriate exam/evaluation, counseling and educating, placing orders, documenting clinical information in the EHR, independently interpreting results, and communicating results.   Return in about 4 months (around 01/06/2024) for Chronic follow up, cancel appointment for 09/28/23.   Luke Shade, MD

## 2023-09-07 ENCOUNTER — Ambulatory Visit: Payer: Self-pay

## 2023-09-07 LAB — COMPREHENSIVE METABOLIC PANEL WITH GFR
ALT: 11 U/L (ref 0–35)
AST: 18 U/L (ref 0–37)
Albumin: 4.1 g/dL (ref 3.5–5.2)
Alkaline Phosphatase: 63 U/L (ref 39–117)
BUN: 19 mg/dL (ref 6–23)
CO2: 23 meq/L (ref 19–32)
Calcium: 9.6 mg/dL (ref 8.4–10.5)
Chloride: 96 meq/L (ref 96–112)
Creatinine, Ser: 0.81 mg/dL (ref 0.40–1.20)
GFR: 64.42 mL/min (ref >60.00)
Glucose, Bld: 89 mg/dL (ref 70–99)
Potassium: 5.1 meq/L (ref 3.5–5.1)
Sodium: 130 meq/L — ABNORMAL LOW (ref 135–145)
Total Bilirubin: 0.4 mg/dL (ref 0.2–1.2)
Total Protein: 6.8 g/dL (ref 6.0–8.3)

## 2023-09-07 LAB — LIPID PANEL
Cholesterol: 184 mg/dL (ref 0–200)
HDL: 55.4 mg/dL (ref >39.00)
LDL Cholesterol: 102 mg/dL — ABNORMAL HIGH (ref 0–99)
NonHDL: 128.37
Total CHOL/HDL Ratio: 3
Triglycerides: 130 mg/dL (ref 0.0–149.0)
VLDL: 26 mg/dL (ref 0.0–40.0)

## 2023-09-18 ENCOUNTER — Encounter: Payer: Medicare PPO | Admitting: Family Medicine

## 2023-09-20 ENCOUNTER — Encounter: Admitting: Nurse Practitioner

## 2023-09-28 ENCOUNTER — Encounter

## 2023-10-16 DIAGNOSIS — Z96652 Presence of left artificial knee joint: Secondary | ICD-10-CM | POA: Diagnosis not present

## 2023-10-31 ENCOUNTER — Telehealth: Payer: Self-pay

## 2023-10-31 NOTE — Telephone Encounter (Signed)
 Pt came into the office and drop office a form pertaining to a certain medication that her insurance stating the medication is unsafe for her to take and she is wanting to get Dr Abbey opinion about the situation. Please advise.

## 2023-11-01 NOTE — Telephone Encounter (Signed)
 Agree with recommendations on recent fall.   In regards to medication management for urinary symptoms. I recommend continue Solifenacin  for now and get evaluated by urologist to change medication for overactive bladder. Both medications (myrbetriq, solifenacin ) helps with urinary urgency, frequency but myrbetriq has less side effects when it comes to dry mouth, constipation etc). If patient is agreeable I will put urology referral.   Luke Shade, MD

## 2023-11-01 NOTE — Telephone Encounter (Signed)
 called pt to get more information. The medication she is speaking on is called SOLIFENACIN  SUCCINATE 5 MG. Pt stated she doesnt need to come in she wanted to be switched to a diffrent alternative (GEMTESA ) or [facility]) which you think is best with less side effect so when she gets back from her trip she can pick it up. She is still taking SOLIFENACIN  SUCCINATE 5 MG b/c she can't be without the medication. PT is traking a trip on October 7th-14th. pt stated she also fell 10/31/2023 she stated she has 2 black eyes and a bloody nose w/an abrasion on the bridge of her nose. I reccomended PT to go to the ED to be seen so they can take X-Rays to make sure she didn't break her nose but she decline that option.

## 2023-11-01 NOTE — Telephone Encounter (Signed)
 Noted.  Jacklin Mascot, MD

## 2023-11-01 NOTE — Telephone Encounter (Unsigned)
 Copied from CRM [id number]. Topic: Clinical - Medication Question >> Nov 01, 2023  2:19 PM [place] A wrote: Reason for CRM: Patient spoke with Urologist who approve patient taking SOLIFENACIN  SUCCINATE 5 MG. Patient wanted message relayed to Dr. Abbey

## 2023-11-01 NOTE — Telephone Encounter (Signed)
 Spoke with patient to make her aware of Dr Graylon recommendations. Patient says she would like to discuss her concerns and the recommendations with Dr Dennise ([facility]). Patient was advised to give our office a call back after she speaks with their office if she has any concerns or questions. Verbalized understanding.

## 2023-11-15 ENCOUNTER — Encounter: Payer: Self-pay | Admitting: Nurse Practitioner

## 2023-11-15 ENCOUNTER — Ambulatory Visit: Admitting: Nurse Practitioner

## 2023-11-15 VITALS — BP 116/74 | HR 53 | Temp 97.5°F | Ht 59.0 in | Wt 165.2 lb

## 2023-11-15 DIAGNOSIS — Z23 Encounter for immunization: Secondary | ICD-10-CM

## 2023-11-15 DIAGNOSIS — R42 Dizziness and giddiness: Secondary | ICD-10-CM | POA: Diagnosis not present

## 2023-11-15 LAB — CBC
HCT: 40.7 % (ref 36.0–46.0)
Hemoglobin: 13.5 g/dL (ref 12.0–15.0)
MCHC: 33.1 g/dL (ref 30.0–36.0)
MCV: 91.4 fl (ref 78.0–100.0)
Platelets: 245 K/uL (ref 150.0–400.0)
RBC: 4.45 Mil/uL (ref 3.87–5.11)
RDW: 13.9 % (ref 11.5–15.5)
WBC: 6.3 K/uL (ref 4.0–10.5)

## 2023-11-15 LAB — BASIC METABOLIC PANEL WITH GFR
BUN: 19 mg/dL (ref 6–23)
CO2: 27 meq/L (ref 19–32)
Calcium: 10 mg/dL (ref 8.4–10.5)
Chloride: 95 meq/L — ABNORMAL LOW (ref 96–112)
Creatinine, Ser: 0.89 mg/dL (ref 0.40–1.20)
GFR: 57.46 mL/min — ABNORMAL LOW (ref >60.00)
Glucose, Bld: 95 mg/dL (ref 70–99)
Potassium: 5.5 meq/L — ABNORMAL HIGH (ref 3.5–5.1)
Sodium: 128 meq/L — ABNORMAL LOW (ref 135–145)

## 2023-11-15 NOTE — Patient Instructions (Signed)
 Advised to maintain proper hydration. Will let us  know if symptoms reoccur.

## 2023-11-15 NOTE — Progress Notes (Signed)
 Established Patient Office Visit  Subjective:  Patient ID: Jill Stanley, female    DOB: [birth year]  Age: 89 y.o. MRN: [id number]  CC:  Chief Complaint  Patient presents with   Acute Visit    Dizziness   Discussed the use of a AI scribe software for clinical note transcription with the patient, who gave verbal consent to proceed.  HPI  Jill Stanley is an 89 year old female who presents with dizziness experienced during a recent trip a week ago.  She experienced dizziness and instability on the last day of a trip to [place] , severe enough to cause fear of walking alone. She consumed two bottles of electrolytes and protein, which alleviated her dizziness. No dizziness has occurred since returning home. She has a history of low sodium levels and is concerned about her electrolyte balance.   Denise any dizziness at present.   HPI   Past Medical History:  Diagnosis Date   Anal pain 03/28/2019   Arthritis    Chronic gastritis without bleeding 07/29/2020   Chronic low back pain    Complication of anesthesia    All my muscles were sore all over my body was sore the next day.. due to mixture of gases   COVID-19    03/2021   Endometrial cancer (HCC)    Epigastric pain 07/29/2020   GERD (gastroesophageal reflux disease)    Heart murmur 2018   tiney murmur, no treatment needed   Hypertension    Hyponatremia    chronic    Iron deficiency 09/15/2022   NONSPECIFIC ABN FINDING RAD & OTH EXAM GI TRACT 07/06/2009   Qualifier: Diagnosis of  By: Ezzard CMA (AAMA), Patty     Osteopenia    Prediabetes    Reduced vision 07/29/2020   Right foot pain 03/28/2019   RLS (restless legs syndrome)    S/P total knee arthroplasty 08/09/2016   Ulcer of the stomach caused by bacteria (H. pylori)    history of   Ulcer of the stomach caused by bacteria (H. pylori)    history of    Past Surgical History:  Procedure Laterality Date   ABDOMINAL HYSTERECTOMY  1992   BACK SURGERY  1989   L3, 4  and 5   DILATION AND CURETTAGE OF UTERUS  1992   KNEE ARTHROPLASTY Left 08/09/2016   Procedure: COMPUTER ASSISTED TOTAL KNEE ARTHROPLASTY;  Surgeon: Mardee Lynwood SQUIBB, MD;  Location: [facility] ORS;  Service: Orthopedics;  Laterality: Left;   KNEE ARTHROSCOPY W/ MENISCAL REPAIR Left     Family History  Problem Relation Age of Onset   Breast cancer Daughter 87   Aneurysm Grandson        brain age 70 y.o died 08-30-19   Alzheimer's disease Father     Social History   Socioeconomic History   Marital status: Widowed    Spouse name: Not on file   Number of children: Not on file   Years of education: Not on file   Highest education level: Professional school degree (e.g., MD, DDS, DVM, JD)  Occupational History   Not on file  Tobacco Use   Smoking status: Never   Smokeless tobacco: Never  Vaping Use   Vaping status: Never Used  Substance and Sexual Activity   Alcohol use: Yes    Comment: a glass of wine 2-3 times a year   Drug use: No   Sexual activity: Not Currently  Other Topics Concern   Not on file  Social  History Narrative   Married lives with husband Babbette Dalesandro who is DPR    Husband died 02/07/20   2019-04-10 married 65 years    Social Drivers of [employer]: Low Risk  (10/16/2023)   Received from [facility]   Overall Financial Resource Strain (CARDIA)    Difficulty of Paying Living Expenses: Not very hard  Food Insecurity: No Food Insecurity (10/16/2023)   Received [facility]   Hunger Vital Sign    Within the past 12 months, you worried that your food would run out before you got the money to buy more.: Never true    Within the past 12 months, the food you bought just didn't last and you didn't have money to get more.: Never true  Transportation Needs: No Transportation Needs (10/16/2023)   Received from [facility] - Transportation    In the past 12 months, has lack of  transportation kept you from medical appointments or from getting medications?: No    Lack of Transportation (Non-Medical): No  Physical Activity: Inactive (07/11/2023)   Exercise Vital Sign    Days of Exercise per Week: 0 days    Minutes of Exercise per Session: 0 min  Stress: No Stress Concern Present (07/11/2023)   [employer] of Occupational Health - Occupational Stress Questionnaire    Feeling of Stress : Not at all  Social Connections: Moderately Isolated (07/11/2023)   Social Connection and Isolation Panel    Frequency of Communication with Friends and Family: Once a week    Frequency of Social Gatherings with Friends and Family: Twice a week    Attends Religious Services: Never    Database administrator or Organizations: Yes    Attends Engineer, structural: More than 4 times per year    Marital Status: Widowed  Intimate Partner Violence: Not At Risk (07/11/2023)   Humiliation, Afraid, Rape, and Kick questionnaire    Fear of Current or Ex-Partner: No    Emotionally Abused: No    Physically Abused: No    Sexually Abused: No     Outpatient Medications Prior to Visit  Medication Sig Dispense Refill   acetaminophen  (TYLENOL ) 650 MG CR tablet Take 1,300 mg by mouth every 8 (eight) hours as needed for pain.     Calcium  Carbonate-Vitamin D3 600-400 MG-UNIT TABS Take 2 tablets by mouth daily.      calcium  elemental as carbonate (BARIATRIC TUMS ULTRA) 400 MG chewable tablet Chew 1,000 mg by mouth as needed for heartburn.     carvedilol  (COREG ) 3.125 MG tablet Take 1 tablet (3.125 mg total) by mouth 2 (two) times daily with a meal. 180 tablet 3   cloNIDine  (CATAPRES ) 0.1 MG tablet Take 1 tablet (0.1 mg total) by mouth in the morning and at bedtime. 180 tablet 3   cyanocobalamin  (VITAMIN B12) 250 MCG tablet Take 250 mcg by mouth daily.     esomeprazole  (NEXIUM ) 20 MG capsule TAKE 1 CAPSULE DAILY 90 capsule 3   ezetimibe  (ZETIA ) 10 MG tablet Take 1 tablet (10 mg total) by  mouth daily. 90 tablet 3   lisinopril  (ZESTRIL ) 40 MG tablet Take 1 tablet (40 mg total) by mouth daily. 90 tablet 3   Polyethyl Glycol-Propyl Glycol (SYSTANE OP) Apply to eye as needed.     sodium chloride  (OCEAN) 0.65 % SOLN nasal spray Place 2 sprays into both nostrils as needed for congestion.  solifenacin  (VESICARE ) 5 MG tablet Take 1 tablet (5 mg total) by mouth daily. 90 tablet 3   spironolactone  (ALDACTONE ) 25 MG tablet Take 1 tablet (25 mg total) by mouth daily. In am See change in sig 1x per day 90 tablet 3   VITAMIN D  PO Take 2,000 Units by mouth daily.      No facility-administered medications prior to visit.    Allergies  Allergen Reactions   Amlodipine Swelling   Hydralazine      Rash   Hydralazine  Hcl     Rash    Norvasc [Amlodipine Besylate]     5 mg leg swelling     Procardia  Xl [Nifedipine  Er]     Leg swelling/ankle    Epinephrine Anxiety    Hyperactivity   Ibuprofen Other (See Comments)    Cannot take due to GI ulcer   Lovastatin Diarrhea, Nausea Only and Nausea And Vomiting   Morphine Nausea Only   Tramadol Nausea Only    Caused nausea, felt awful all over, and med did not help with pain Felt awful all over, and med did not help with pain   Zocor [Simvastatin] Diarrhea, Nausea And Vomiting and Nausea Only    Nausea and vomiting      ROS Review of Systems Negative unless indicated in HPI.    Objective:    Physical Exam Constitutional:      Appearance: Normal appearance.  HENT:     Mouth/Throat:     Mouth: Mucous membranes are moist.  Eyes:     Conjunctiva/sclera: Conjunctivae normal.     Pupils: Pupils are equal, round, and reactive to light.  Cardiovascular:     Rate and Rhythm: Normal rate and regular rhythm.     Pulses: Normal pulses.     Heart sounds: Normal heart sounds.  Pulmonary:     Effort: Pulmonary effort is normal.     Breath sounds: Normal breath sounds.  Musculoskeletal:     Cervical back: Normal range of motion. No  tenderness.  Skin:    General: Skin is warm.     Findings: No bruising.  Neurological:     General: No focal deficit present.     Mental Status: She is alert and oriented to person, place, and time. Mental status is at baseline.  Psychiatric:        Mood and Affect: Mood normal.        Behavior: Behavior normal.        Thought Content: Thought content normal.     BP 116/74   Pulse (!) 53   Temp (!) 97.5 F (36.4 C)   Ht 4' 11 (1.499 m)   Wt 165 lb 3.2 oz (74.9 kg)   SpO2 98%   BMI 33.37 kg/m  Wt Readings from Last 3 Encounters:  11/15/23 165 lb 3.2 oz (74.9 kg)  09/06/23 168 lb 3.2 oz (76.3 kg)  08/02/23 167 lb 9.6 oz (76 kg)     Health Maintenance  Topic Date Due   COVID-19 Vaccine (8 - 2025-26 season) 11/30/2023 (Originally 10/01/2023)   Medicare Annual Wellness (AWV)  07/10/2024   Mammogram  07/23/2024   DTaP/Tdap/Td (3 - Td or Tdap) 04/21/2031   Pneumococcal Vaccine: 50+ Years  Completed   Influenza Vaccine  Completed   DEXA SCAN  Completed   Zoster Vaccines- Shingrix   Completed   Meningococcal B Vaccine  Aged Out    There are no preventive care reminders to display for this patient.  Lab Results  Component Value Date   TSH 1.36 09/06/2023   Lab Results  Component Value Date   WBC 7.3 09/17/2022   HGB 14.3 09/17/2022   HCT 41.8 09/17/2022   MCV 92.1 09/17/2022   PLT 298 09/17/2022   Lab Results  Component Value Date   NA 130 (L) 09/06/2023   K 5.1 09/06/2023   CO2 23 09/06/2023   GLUCOSE 89 09/06/2023   BUN 19 09/06/2023   CREATININE 0.81 09/06/2023   BILITOT 0.4 09/06/2023   ALKPHOS 63 09/06/2023   AST 18 09/06/2023   ALT 11 09/06/2023   PROT 6.8 09/06/2023   ALBUMIN 4.1 09/06/2023   CALCIUM  9.6 09/06/2023   ANIONGAP 9 09/17/2022   GFR 64.42 09/06/2023   Lab Results  Component Value Date   CHOL 184 09/06/2023   Lab Results  Component Value Date   HDL 55.40 09/06/2023   Lab Results  Component Value Date   LDLCALC 102 (H)  09/06/2023   Lab Results  Component Value Date   TRIG 130.0 09/06/2023   Lab Results  Component Value Date   CHOLHDL 3 09/06/2023   Lab Results  Component Value Date   HGBA1C 6.2 09/06/2023      Assessment & Plan:  Dizziness Assessment & Plan: Vital signs stable, patient in is no sign of acute distress. Dizziness  resolved. No recurrence since the last episode more than a week back. Has h/o of hyponatremia.  -Will check labs as outlined. - Advised proper hydration.  - She will let us  know if the symptoms reoccurs.  Orders: -     Basic metabolic panel with GFR -     CBC  Immunization due -     Flu vaccine HIGH DOSE PF(Fluzone Trivalent)    Follow-up: Return if symptoms worsen or fail to improve.   Nattie Lazenby, NP

## 2023-11-15 NOTE — Assessment & Plan Note (Addendum)
 Vital signs stable, patient in is no sign of acute distress. Dizziness  resolved. No recurrence since the last episode more than a week back. Has h/o of hyponatremia.  -Will check labs as outlined. - Advised proper hydration.  - She will let us  know if the symptoms reoccurs.

## 2023-11-16 ENCOUNTER — Ambulatory Visit: Payer: Self-pay | Admitting: Nurse Practitioner

## 2023-11-16 DIAGNOSIS — E871 Hypo-osmolality and hyponatremia: Secondary | ICD-10-CM

## 2023-11-16 NOTE — Progress Notes (Signed)
 FYI Dr. Abbey  Please call pt and inform: Her sodium is low (128) and potassium high (5.5). I will recommend to hold spironolactone  and recheck non fasting lab on 11/19/23. Also advise pt to monitor BP daily and bring the reading during the lab appointment.   She should get evaluated if experienced dizziness, confusion or any other symptoms during the weekend.

## 2023-11-19 ENCOUNTER — Telehealth: Payer: Self-pay

## 2023-11-19 ENCOUNTER — Other Ambulatory Visit (INDEPENDENT_AMBULATORY_CARE_PROVIDER_SITE_OTHER)

## 2023-11-19 DIAGNOSIS — E871 Hypo-osmolality and hyponatremia: Secondary | ICD-10-CM | POA: Diagnosis not present

## 2023-11-19 NOTE — Telephone Encounter (Signed)
 Pt came into the office and drop off some blood pressure readings. The readings has been placed in Dr. Abbey folder up front.

## 2023-11-20 LAB — BASIC METABOLIC PANEL WITH GFR
BUN: 18 mg/dL (ref 7–25)
CO2: 23 mmol/L (ref 20–32)
Calcium: 9.8 mg/dL (ref 8.6–10.4)
Chloride: 96 mmol/L — ABNORMAL LOW (ref 98–110)
Creat: 0.89 mg/dL (ref 0.60–0.95)
Glucose, Bld: 99 mg/dL (ref 65–99)
Potassium: 5.5 mmol/L — ABNORMAL HIGH (ref 3.5–5.3)
Sodium: 130 mmol/L — ABNORMAL LOW (ref 135–146)
eGFR: 62 mL/min/1.73m2 (ref >=60)

## 2023-11-21 NOTE — Telephone Encounter (Signed)
 BP readings has been placed in provider's folder for review upon arrival.

## 2023-11-22 ENCOUNTER — Ambulatory Visit: Payer: Self-pay | Admitting: Nurse Practitioner

## 2023-11-22 NOTE — Progress Notes (Signed)
 Please call pt to inform: Sodium level has improved. Potassium is still elevated. Is pt still taking spirolactone?

## 2023-11-28 ENCOUNTER — Telehealth: Payer: Self-pay

## 2023-11-28 NOTE — Telephone Encounter (Signed)
 Lm that 01/07/2024 appointment has been changed to 4pm. Sent MyChart message.

## 2023-12-14 ENCOUNTER — Other Ambulatory Visit: Payer: Self-pay | Admitting: Nurse Practitioner

## 2023-12-14 DIAGNOSIS — E875 Hyperkalemia: Secondary | ICD-10-CM

## 2023-12-14 NOTE — Progress Notes (Signed)
 Please call pt and schedule lab appointment to recheck potassium next week.  Labs are in.

## 2023-12-19 ENCOUNTER — Other Ambulatory Visit

## 2023-12-19 ENCOUNTER — Other Ambulatory Visit (INDEPENDENT_AMBULATORY_CARE_PROVIDER_SITE_OTHER)

## 2023-12-19 DIAGNOSIS — E875 Hyperkalemia: Secondary | ICD-10-CM | POA: Diagnosis not present

## 2023-12-19 LAB — BASIC METABOLIC PANEL WITH GFR
BUN: 22 mg/dL (ref 6–23)
CO2: 28 meq/L (ref 19–32)
Calcium: 10 mg/dL (ref 8.4–10.5)
Chloride: 96 meq/L (ref 96–112)
Creatinine, Ser: 0.94 mg/dL (ref 0.40–1.20)
GFR: 53.78 mL/min — ABNORMAL LOW (ref >60.00)
Glucose, Bld: 88 mg/dL (ref 70–99)
Potassium: 5.1 meq/L (ref 3.5–5.1)
Sodium: 130 meq/L — ABNORMAL LOW (ref 135–145)

## 2023-12-20 ENCOUNTER — Ambulatory Visit: Payer: Self-pay | Admitting: Nurse Practitioner

## 2023-12-20 NOTE — Progress Notes (Signed)
 Noted

## 2023-12-20 NOTE — Progress Notes (Signed)
 Please inform pt: Sodium low,  stable at baseline. Follow up with nephrology.

## 2023-12-24 DIAGNOSIS — I10 Essential (primary) hypertension: Secondary | ICD-10-CM | POA: Diagnosis not present

## 2023-12-24 DIAGNOSIS — R32 Unspecified urinary incontinence: Secondary | ICD-10-CM | POA: Diagnosis not present

## 2023-12-24 DIAGNOSIS — E871 Hypo-osmolality and hyponatremia: Secondary | ICD-10-CM | POA: Diagnosis not present

## 2024-01-07 ENCOUNTER — Ambulatory Visit

## 2024-01-14 ENCOUNTER — Ambulatory Visit

## 2024-01-14 VITALS — BP 130/70 | HR 71 | Temp 97.9°F | Ht 59.0 in | Wt 166.2 lb

## 2024-01-14 DIAGNOSIS — E871 Hypo-osmolality and hyponatremia: Secondary | ICD-10-CM

## 2024-01-14 DIAGNOSIS — R011 Cardiac murmur, unspecified: Secondary | ICD-10-CM | POA: Insufficient documentation

## 2024-01-14 DIAGNOSIS — I1A0 Resistant hypertension: Secondary | ICD-10-CM | POA: Diagnosis not present

## 2024-01-14 NOTE — Patient Instructions (Addendum)
-   You should get a call from cardiology clinic to schedule an appointment. If you do not hear from them within couple of weeks please reach out to our office.  - Follow up with Dr. Abbey in 3 months

## 2024-01-14 NOTE — Assessment & Plan Note (Signed)
 BP stable on current regimen, continue Spironolactone  25 mg three times a week, Coreg  3.125 mg BID, Clonidine  0.1 mg BID, Lisinopril  40 mg daily.

## 2024-01-14 NOTE — Assessment & Plan Note (Signed)
 Patient euvolemic. Currently she is taking Spironolactone  25 mg three times a week. Continue current medications without change. Continue follow up with nephrologist Dr. Dennise.

## 2024-01-14 NOTE — Progress Notes (Signed)
 Established Patient Office Visit   Subjective  Patient ID: Jill Stanley, female    DOB: [birth year]  Age: 89 y.o. MRN: [id number]  Chief Complaint  Patient presents with   Hyperlipidemia   Hypertension   Gastroesophageal Reflux    Discussed the use of AI scribe software for clinical note transcription with the patient, who gave verbal consent to proceed.  History of Present Illness  Jill Stanley is an 89 year old female who presents for chronic medication management.   In October, she experienced dizziness while in [place] , which she attributes to low sodium levels. Her sodium levels were initially 126 mmol/L and improved to her baseline of 130 mmol/L after consuming a sports drink. She has not experienced any dizziness since then.  She takes a Spironolactone  25 mg three times a week to manage her sodium levels without significantly raising her blood pressure. She regularly monitors her blood pressure, noting it is slightly elevated at 130/70-75, which she considers acceptable. No shortness of breath or leg swelling. She has upcoming appointment with nephrologist in January.    She is active and travels frequently, recently visiting [place], [place] , for a senior citizens' tournament. She enjoys staying active and social, which she finds beneficial for her well-being.    ROS As per HPI    Objective:     BP 130/70 (BP Location: Left Arm, Patient Position: Sitting, Cuff Size: Normal)   Pulse 71   Temp 97.9 F (36.6 C) (Oral)   Ht 4' 11 (1.499 m)   Wt 166 lb 3.2 oz (75.4 kg)   SpO2 99%   BMI 33.57 kg/m      01/14/2024    4:06 PM 09/06/2023   10:53 AM 07/25/2023    1:12 PM  Depression screen PHQ 2/9  Decreased Interest 0 0 0  Down, Depressed, Hopeless 0 0 0  PHQ - 2 Score 0 0 0  Altered sleeping 0 0 0  Tired, decreased energy 0 0 0  Change in appetite 0 0 0  Feeling bad or failure about yourself  0 0 0  Trouble concentrating 0 0 0  Moving slowly or  fidgety/restless 0 0 0  Suicidal thoughts 0 0 0  PHQ-9 Score 0 0  0   Difficult doing work/chores Not difficult at all Not difficult at all Not difficult at all     Data saved with a previous flowsheet row definition      01/14/2024    4:06 PM 09/06/2023   10:53 AM 07/25/2023    1:12 PM 04/04/2023    9:49 AM  GAD 7 : Generalized Anxiety Score  Nervous, Anxious, on Edge 0 0 0 0  Control/stop worrying 0 0 0 0  Worry too much - different things 0 0 0 0  Trouble relaxing 0 0 0 0  Restless 0 0 0 0  Easily annoyed or irritable 0 0 0 0  Afraid - awful might happen 0 0 0 0  Total GAD 7 Score 0 0 0 0  Anxiety Difficulty Not difficult at all Not difficult at all Not difficult at all Not difficult at all      01/14/2024    4:06 PM 09/06/2023   10:53 AM 07/25/2023    1:12 PM  Depression screen PHQ 2/9  Decreased Interest 0 0 0  Down, Depressed, Hopeless 0 0 0  PHQ - 2 Score 0 0 0  Altered sleeping 0 0 0  Tired, decreased energy 0 0  0  Change in appetite 0 0 0  Feeling bad or failure about yourself  0 0 0  Trouble concentrating 0 0 0  Moving slowly or fidgety/restless 0 0 0  Suicidal thoughts 0 0 0  PHQ-9 Score 0 0  0   Difficult doing work/chores Not difficult at all Not difficult at all Not difficult at all     Data saved with a previous flowsheet row definition      01/14/2024    4:06 PM 09/06/2023   10:53 AM 07/25/2023    1:12 PM 04/04/2023    9:49 AM  GAD 7 : Generalized Anxiety Score  Nervous, Anxious, on Edge 0 0 0 0  Control/stop worrying 0 0 0 0  Worry too much - different things 0 0 0 0  Trouble relaxing 0 0 0 0  Restless 0 0 0 0  Easily annoyed or irritable 0 0 0 0  Afraid - awful might happen 0 0 0 0  Total GAD 7 Score 0 0 0 0  Anxiety Difficulty Not difficult at all Not difficult at all Not difficult at all Not difficult at all   SDOH Screenings   Food Insecurity: No Food Insecurity (10/16/2023)   Received from [facility]  Housing: Low Risk   (10/16/2023)   Received from [facility]  Transportation Needs: No Transportation Needs (10/16/2023)   Received from [facility]  Utilities: Not At Risk (10/16/2023)   Received from [facility]  Alcohol Screen: Low Risk (07/11/2023)  Depression (PHQ2-9): Low Risk (01/14/2024)  Financial Resource Strain: Low Risk  (10/16/2023)   Received from [facility]  Physical Activity: Inactive (07/11/2023)  Social Connections: Moderately Isolated (07/11/2023)  Stress: No Stress Concern Present (07/11/2023)  Tobacco Use: Low Risk (01/14/2024)  Health Literacy: Inadequate Health Literacy (07/11/2023)     Physical Exam Constitutional:      General: She is not in acute distress.    Appearance: Normal appearance.  HENT:     Head: Normocephalic and atraumatic.     Right Ear: Tympanic membrane normal.     Left Ear: Tympanic membrane normal.     Mouth/Throat:     Mouth: Mucous membranes are moist.  Neck:     Thyroid : No thyroid  mass or thyroid  tenderness.  Cardiovascular:     Rate and Rhythm: Normal rate and regular rhythm.     Heart sounds: Murmur (grade II systolic murmur, loudest at R second ICS space) heard.  Pulmonary:     Effort: Pulmonary effort is normal.     Breath sounds: Normal breath sounds. No wheezing.  Abdominal:     General: Bowel sounds are normal.     Palpations: Abdomen is soft.     Tenderness: There is no guarding.  Musculoskeletal:     Cervical back: Neck supple. No rigidity.     Right lower leg: No edema.     Left lower leg: No edema.  Skin:    General: Skin is warm.  Neurological:     Mental Status: She is alert and oriented to person, place, and time.  Psychiatric:        Mood and Affect: Mood normal.        Behavior: Behavior normal.        No results found for any visits on 01/14/24.  The ASCVD Risk score (Arnett DK, et al., 2019) failed to calculate for the following reasons:   The 2019  ASCVD risk score is only valid for ages 45 to 58   * - Cholesterol units were assumed     Assessment & Plan:  She is currently taking Solifenacin  5 mg for urinary incontinence. She is planning on following up with nephrologist Dr. Dennise on changing medication during her January appointment.  Assessment & Plan Systolic murmur New systolic murmur, grade II on right second ICS. Patient euvolemic. No immediate symptoms. Referred to cardiology for evaluation including potential echocardiogram.   Orders:   Ambulatory referral to Cardiology  Chronic hyponatremia Patient euvolemic. Currently she is taking Spironolactone  25 mg three times a week. Continue current medications without change. Continue follow up with nephrologist Dr. Dennise.     Resistant hypertension BP stable on current regimen, continue Spironolactone  25 mg three times a week, Coreg  3.125 mg BID, Clonidine  0.1 mg BID, Lisinopril  40 mg daily.      Return in about 3 months (around 04/13/2024) for Chronic follow up with Dr. Abbey .   Luke Abbey, MD

## 2024-01-14 NOTE — Assessment & Plan Note (Addendum)
 New systolic murmur, grade II on right second ICS. Patient euvolemic. No immediate symptoms. Referred to cardiology for evaluation including potential echocardiogram.   Orders:   Ambulatory referral to Cardiology

## 2024-01-31 NOTE — Progress Notes (Unsigned)
 Cardiology Office Note  Date:  02/01/2024   ID:  Jill, Stanley 1932-06-05, MRN [id number]  PCP:  Jill Bruckner, MD   Chief Complaint  Patient presents with   New Patient (Initial Visit)    Heart murmur c/o right arm/hand numbness. Meds reviewed verbally with pt.   HPI:  Jill Stanley is a 89 y.o. female with past medical history of: Past Medical History:  Diagnosis Date   Anal pain 03/28/2019   Arthritis    Chronic gastritis without bleeding 07/29/2020   Chronic low back pain    Complication of anesthesia    All my muscles were sore all over my body was sore the next day.. due to mixture of gases   COVID-19    03/2021   Endometrial cancer (HCC)    Epigastric pain 07/29/2020   GERD (gastroesophageal reflux disease)    Heart murmur 2018   tiney murmur, no treatment needed   Hypertension    Hyponatremia    chronic    Iron deficiency 09/15/2022   NONSPECIFIC ABN FINDING RAD & OTH EXAM GI TRACT 07/06/2009   Qualifier: Diagnosis of  By: Ezzard CMA (AAMA), Patty     Osteopenia    Prediabetes    Reduced vision 07/29/2020   Right foot pain 03/28/2019   RLS (restless legs syndrome)    S/P total knee arthroplasty 08/09/2016   Ulcer of the stomach caused by bacteria (H. pylori)    history of   Ulcer of the stomach caused by bacteria (H. pylori)    history of  Who presents by referral from Dr. Abbey for heart murmur,  hyperlipidemia, statin intolerance  Blood pressure at Home 130s/70  Low sodium 130 Hydrochlorothiazide  held , BP went higher A1C 6.2 Normal CBC Total cholesterol 184 LDL 102  Active, travels with friends Activity/exercise limited secondary to prior back surgery, knee surgery, arthritis  Echocardiogram November 2022 Normal ejection fraction Aortic valve sclerosis without significant stenosis noted Renal ultrasound July 2020, no stenosis  nonsmoker  EKG personally reviewed by myself on todays visit EKG Interpretation Date/Time:  Friday February 01 2024 11:16:20 EST Ventricular Rate:  63 PR Interval:  142 QRS Duration:  68 QT Interval:  372 QTC Calculation: 380 R Axis:   -3  Text Interpretation: Normal sinus rhythm When compared with ECG of 16-Sep-2022 03:24, No significant change was found Confirmed by Perla Lye [phone]) on 02/01/2024 11:30:57 AM    PMH:   has a past medical history of Anal pain (03/28/2019), Arthritis, Chronic gastritis without bleeding (07/29/2020), Chronic low back pain, Complication of anesthesia, COVID-19, Endometrial cancer (HCC), Epigastric pain (07/29/2020), GERD (gastroesophageal reflux disease), Heart murmur (2018), Hypertension, Hyponatremia, Iron deficiency (09/15/2022), NONSPECIFIC ABN FINDING RAD & OTH EXAM GI TRACT (07/06/2009), Osteopenia, Prediabetes, Reduced vision (07/29/2020), Right foot pain (03/28/2019), RLS (restless legs syndrome), S/P total knee arthroplasty (08/09/2016), Ulcer of the stomach caused by bacteria (H. pylori), and Ulcer of the stomach caused by bacteria (H. pylori).   PSH:    Past Surgical History:  Procedure Laterality Date   ABDOMINAL HYSTERECTOMY  1992   BACK SURGERY  1989   L3, 4 and 5   DILATION AND CURETTAGE OF UTERUS  1992   KNEE ARTHROPLASTY Left 08/09/2016   Procedure: COMPUTER ASSISTED TOTAL KNEE ARTHROPLASTY;  Surgeon: Jill Lynwood SQUIBB, MD;  Location: [facility] ORS;  Service: Orthopedics;  Laterality: Left;   KNEE ARTHROSCOPY W/ MENISCAL REPAIR Left     Current Outpatient Medications  Medication Sig Dispense  Refill   acetaminophen  (TYLENOL ) 650 MG CR tablet Take 1,300 mg by mouth every 8 (eight) hours as needed for pain.     Calcium  Carbonate-Vitamin D3 600-400 MG-UNIT TABS Take 2 tablets by mouth daily.      calcium  elemental as carbonate (BARIATRIC TUMS ULTRA) 400 MG chewable tablet Chew 1,000 mg by mouth as needed for heartburn.     carvedilol  (COREG ) 3.125 MG tablet Take 1 tablet (3.125 mg total) by mouth 2 (two) times daily with a meal. 180 tablet 3   cloNIDine   (CATAPRES ) 0.1 MG tablet Take 1 tablet (0.1 mg total) by mouth in the morning and at bedtime. 180 tablet 3   cyanocobalamin  (VITAMIN B12) 250 MCG tablet Take 250 mcg by mouth daily.     esomeprazole  (NEXIUM ) 20 MG capsule TAKE 1 CAPSULE DAILY (Patient taking differently: 20 mg daily in the afternoon. TAKE 1 CAPSULE DAILY) 90 capsule 3   ezetimibe  (ZETIA ) 10 MG tablet Take 1 tablet (10 mg total) by mouth daily. 90 tablet 3   lisinopril  (ZESTRIL ) 40 MG tablet Take 1 tablet (40 mg total) by mouth daily. 90 tablet 3   Polyethyl Glycol-Propyl Glycol (SYSTANE OP) Apply to eye as needed.     sodium chloride  (OCEAN) 0.65 % SOLN nasal spray Place 2 sprays into both nostrils as needed for congestion.     solifenacin  (VESICARE ) 5 MG tablet Take 1 tablet (5 mg total) by mouth daily. 90 tablet 3   spironolactone  (ALDACTONE ) 25 MG tablet Take 1 tablet (25 mg total) by mouth daily. In am See change in sig 1x per day (Patient taking differently: Take 25 mg by mouth daily. Takes Sundays, Tuesdays and Fridays once.) 90 tablet 3   VITAMIN D  PO Take 2,000 Units by mouth daily.      No current facility-administered medications for this visit.     Allergies:   Amlodipine, Hydralazine , Hydralazine  hcl, Norvasc [amlodipine besylate], Procardia  xl [nifedipine  er], Epinephrine, Ibuprofen, Lovastatin, Morphine, Tramadol, and Zocor [simvastatin]   Social History:  The patient  reports that she has never smoked. She has never used smokeless tobacco. She reports current alcohol use. She reports that she does not use drugs.   Family History:   family history includes Alzheimer's disease in her father; Aneurysm in her grandson; Breast cancer (age of onset: 23) in her daughter.    Review of Systems: Review of Systems  Constitutional: Negative.   HENT: Negative.    Respiratory: Negative.    Cardiovascular: Negative.   Gastrointestinal: Negative.   Musculoskeletal:  Positive for joint pain.  Neurological: Negative.    Psychiatric/Behavioral: Negative.    All other systems reviewed and are negative.   PHYSICAL EXAM: VS:  BP (!) 158/78 (BP Location: Right Arm, Cuff Size: Normal)   Pulse 63   Ht 4' 11 (1.499 m)   Wt 168 lb 2 oz (76.3 kg)   SpO2 98%   BMI 33.96 kg/m  , BMI Body mass index is 33.96 kg/m. GEN: Well nourished, well developed, in no acute distress HEENT: normal Neck: no JVD, carotid bruits, or masses Cardiac: RRR; no murmurs, rubs, or gallops,no edema  Respiratory:  clear to auscultation bilaterally, normal work of breathing GI: soft, nontender, nondistended, + BS MS: no deformity or atrophy Skin: warm and dry, no rash Neuro:  Strength and sensation are intact Psych: euthymic mood, full affect   Recent Labs: 09/06/2023: ALT 11; TSH 1.36 11/15/2023: Hemoglobin 13.5; Platelets 245.0 12/19/2023: BUN 22; Creatinine, Ser 0.94; Potassium  5.1; Sodium 130    Lipid Panel Lab Results  Component Value Date   CHOL 184 09/06/2023   HDL 55.40 09/06/2023   LDLCALC 102 (H) 09/06/2023   TRIG 130.0 09/06/2023      Wt Readings from Last 3 Encounters:  02/01/24 168 lb 2 oz (76.3 kg)  01/14/24 166 lb 3.2 oz (75.4 kg)  11/15/23 165 lb 3.2 oz (74.9 kg)      ASSESSMENT AND PLAN:  Problem List Items Addressed This Visit       Cardiology Problems   Hypertension   Relevant Orders   EKG 12-Lead (Completed)   Hyperlipidemia   Relevant Orders   EKG 12-Lead (Completed)   Aortic atherosclerosis - Primary   Relevant Orders   EKG 12-Lead (Completed)     Other   Systolic murmur   Relevant Orders   EKG 12-Lead (Completed)   Prediabetes   Relevant Orders   EKG 12-Lead (Completed)   Statin intolerance   Relevant Orders   EKG 12-Lead (Completed)   Systolic murmur Likely secondary to aortic valve pathology Valve sclerosis previously noted on echocardiogram 2022, no stenosis at that time We recommended repeat echocardiogram for further evaluation of her valve Suspect she will have  mild aortic valve stenosis She is asymptomatic, no further intervention needed at this time  Essential hypertension Initial blood pressure elevated, improved on recheck Reports blood pressure 130/70 at home No further medication changes made  Hyponatremia Off HCTZ, tolerating spironolactone  3 times a week Blood pressure stable  Statin intolerance Tolerating Zetia  No known coronary disease or aortic atherosclerosis If desired could consider CT calcium  screening  Prediabetes We have encouraged continued exercise, careful diet management      Signed, Velinda Lunger, M.D., Ph.D. [facility] [place], [place] [phone]

## 2024-02-01 ENCOUNTER — Encounter: Payer: Self-pay | Admitting: Cardiovascular Disease

## 2024-02-01 ENCOUNTER — Ambulatory Visit: Attending: Cardiovascular Disease | Admitting: Cardiovascular Disease

## 2024-02-01 VITALS — BP 130/77 | HR 63 | Ht 59.0 in | Wt 168.1 lb

## 2024-02-01 DIAGNOSIS — I10 Essential (primary) hypertension: Secondary | ICD-10-CM

## 2024-02-01 DIAGNOSIS — Z789 Other specified health status: Secondary | ICD-10-CM

## 2024-02-01 DIAGNOSIS — R7303 Prediabetes: Secondary | ICD-10-CM

## 2024-02-01 DIAGNOSIS — I700 Atherosclerosis of aorta: Secondary | ICD-10-CM

## 2024-02-01 DIAGNOSIS — E782 Mixed hyperlipidemia: Secondary | ICD-10-CM | POA: Diagnosis not present

## 2024-02-01 DIAGNOSIS — R011 Cardiac murmur, unspecified: Secondary | ICD-10-CM

## 2024-02-01 DIAGNOSIS — I350 Nonrheumatic aortic (valve) stenosis: Secondary | ICD-10-CM

## 2024-02-01 NOTE — Patient Instructions (Addendum)

## 2024-03-18 ENCOUNTER — Ambulatory Visit

## 2024-04-22 ENCOUNTER — Ambulatory Visit

## 2024-07-14 ENCOUNTER — Ambulatory Visit
